# Patient Record
Sex: Male | Born: 1950
Health system: Southern US, Community
[De-identification: ages and names within clinical notes are randomized; demographics above are authoritative.]

## PROBLEM LIST (undated history)

## (undated) DIAGNOSIS — I10 Essential (primary) hypertension: Secondary | ICD-10-CM

## (undated) DIAGNOSIS — E119 Type 2 diabetes mellitus without complications: Secondary | ICD-10-CM

## (undated) DIAGNOSIS — E785 Hyperlipidemia, unspecified: Secondary | ICD-10-CM

## (undated) DIAGNOSIS — J309 Allergic rhinitis, unspecified: Secondary | ICD-10-CM

## (undated) DIAGNOSIS — I219 Acute myocardial infarction, unspecified: Secondary | ICD-10-CM

## (undated) DIAGNOSIS — M1990 Unspecified osteoarthritis, unspecified site: Secondary | ICD-10-CM

## (undated) DIAGNOSIS — I2510 Atherosclerotic heart disease of native coronary artery without angina pectoris: Secondary | ICD-10-CM

## (undated) DIAGNOSIS — K219 Gastro-esophageal reflux disease without esophagitis: Secondary | ICD-10-CM

## (undated) HISTORY — DX: Allergic rhinitis, unspecified: J30.9

## (undated) HISTORY — PX: COLONOSCOPY: SHX174

## (undated) HISTORY — PX: OTHER SURGICAL HISTORY: SHX169

## (undated) HISTORY — DX: Acute myocardial infarction, unspecified: I21.9

## (undated) HISTORY — DX: Unspecified osteoarthritis, unspecified site: M19.90

## (undated) HISTORY — DX: Essential (primary) hypertension: I10

## (undated) HISTORY — DX: Atherosclerotic heart disease of native coronary artery without angina pectoris: I25.10

## (undated) HISTORY — PX: ANGIOPLASTY: SHX39

## (undated) HISTORY — DX: Type 2 diabetes mellitus without complications: E11.9

## (undated) HISTORY — PX: UPPER GASTROINTESTINAL ENDOSCOPY: SHX188

## (undated) HISTORY — PX: EYE SURGERY: SHX253

## (undated) HISTORY — DX: Hyperlipidemia, unspecified: E78.5

## (undated) HISTORY — PX: TONSILLECTOMY: SUR1361

## (undated) HISTORY — DX: Gastro-esophageal reflux disease without esophagitis: K21.9

---

## 2013-03-22 LAB — CBC AND DIFFERENTIAL
HCT: 39 % — AB (ref 41–53)
Hemoglobin: 13.5 g/dL (ref 13.5–17.5)
Platelets: 121 10*3/uL — AB (ref 150–399)
WBC: 4.5 10^3/mL

## 2014-11-26 LAB — BASIC METABOLIC PANEL
BUN: 13 mg/dL (ref 4–21)
Creatinine: 1 mg/dL (ref <=1.3)
Glucose: 86 mg/dL
Potassium: 4.4 mmol/L (ref 3.4–5.3)
Sodium: 139 mmol/L (ref 137–147)

## 2014-11-26 LAB — LIPID PANEL
Cholesterol: 124 mg/dL (ref 0–200)
HDL: 44 mg/dL (ref 35–70)
LDL Cholesterol: 58 mg/dL
Triglycerides: 111 mg/dL (ref 40–160)

## 2014-11-26 LAB — PSA: PSA: 0.89

## 2014-11-26 LAB — TSH: TSH: 1.2 u[IU]/mL (ref <=5.90)

## 2015-01-06 ENCOUNTER — Ambulatory Visit (INDEPENDENT_AMBULATORY_CARE_PROVIDER_SITE_OTHER): Payer: Managed Care, Other (non HMO) | Admitting: Family Medicine

## 2015-01-06 ENCOUNTER — Encounter: Payer: Self-pay | Admitting: Family Medicine

## 2015-01-06 VITALS — BP 118/74 | HR 60 | Temp 98.0°F | Ht 72.0 in | Wt 216.0 lb

## 2015-01-06 DIAGNOSIS — I10 Essential (primary) hypertension: Secondary | ICD-10-CM | POA: Diagnosis not present

## 2015-01-06 DIAGNOSIS — E785 Hyperlipidemia, unspecified: Secondary | ICD-10-CM

## 2015-01-06 DIAGNOSIS — M1990 Unspecified osteoarthritis, unspecified site: Secondary | ICD-10-CM | POA: Insufficient documentation

## 2015-01-06 DIAGNOSIS — Z87891 Personal history of nicotine dependence: Secondary | ICD-10-CM | POA: Insufficient documentation

## 2015-01-06 DIAGNOSIS — K219 Gastro-esophageal reflux disease without esophagitis: Secondary | ICD-10-CM | POA: Insufficient documentation

## 2015-01-06 DIAGNOSIS — I2510 Atherosclerotic heart disease of native coronary artery without angina pectoris: Secondary | ICD-10-CM | POA: Diagnosis not present

## 2015-01-06 DIAGNOSIS — J309 Allergic rhinitis, unspecified: Secondary | ICD-10-CM | POA: Insufficient documentation

## 2015-01-06 MED ORDER — NIACIN ER (ANTIHYPERLIPIDEMIC) 750 MG PO TBCR: 750.0000 mg | EXTENDED_RELEASE_TABLET | Freq: Every day | ORAL | Status: DC

## 2015-01-06 MED ORDER — MELOXICAM 15 MG PO TABS: 15.0000 mg | ORAL_TABLET | Freq: Every day | ORAL | Status: DC

## 2015-01-06 MED ORDER — SIMVASTATIN 80 MG PO TABS: 80.0000 mg | ORAL_TABLET | Freq: Every day | ORAL | Status: DC

## 2015-01-06 MED ORDER — LOSARTAN POTASSIUM 50 MG PO TABS: 50.0000 mg | ORAL_TABLET | Freq: Every day | ORAL | Status: DC

## 2015-01-06 MED ORDER — FLUTICASONE PROPIONATE 50 MCG/ACT NA SUSP: 2.0000 | Freq: Every day | NASAL | Status: DC

## 2015-01-06 NOTE — Assessment & Plan Note (Signed)
S: compliant on simvastatin 80mg , niaspan 750mg . Has had labs within a few months A/P: obtain labs, continue current therapy

## 2015-01-06 NOTE — Assessment & Plan Note (Signed)
S:mouth fills with saliva and coughing.had been referred and is Seeing GI in [place] next Monday. Compliant with nexium 40mg  and zantac BID as prescribed at an urgent care type facility A/P: this is not classic. ? Allergic rhinitis. Sounds like will at least need endoscopy given level of therapy he is on. Asked him to have Gi forward [place] notes, sounds like needs colonoscopy as well.

## 2015-01-06 NOTE — Assessment & Plan Note (Signed)
Need labs to evaluate GFR for long term use of mobic. BMET likely every 6 months.

## 2015-01-06 NOTE — Assessment & Plan Note (Signed)
S: controlled on losartan 50mg  A/P: continue current rx

## 2015-01-06 NOTE — Progress Notes (Signed)
William Reddish, MD Phone: [phone]  Subjective:  Patient presents today to establish care. Patient was last cared for by William Park in [facility] Chief complaint-noted.   See problem oriented charting Pertinent negative ROS- no chest pain, shortness of breath, blurry vision, nausea, vomiting.  Positives-joint pain controlled by mobic mainly in hands. Seasonal allergies- watery itchy eyes and runny nose usually controlled by allegra and flonase. Otherwise see subjective  The following were reviewed and entered/updated in epic: Past Medical History  Diagnosis Date  . CAD (coronary artery disease)     Dr. Claiborne Park s/p 4 angioplasty's with last in 65. ASA. simvastatin 80mg   . Hyperlipidemia     simvastatin 80mg , niaspan 750mg   . GERD (gastroesophageal reflux disease)     nexium 40mg  and zantac BID-mout fills with saliva and coughing. Seeing GI in [facility] 12/2014  . Allergic rhinitis     allegra and flonase  . Hypertension     losartan 50mg   . Osteoarthritis     mainly hands. Mobic 15mg    Patient Active Problem List   Diagnosis Date Noted  . CAD (coronary artery disease)     Priority: High  . Hyperlipidemia     Priority: Medium  . Hypertension     Priority: Medium  . GERD (gastroesophageal reflux disease)     Priority: Medium  . Osteoarthritis     Priority: Medium  . Former smoker 01/06/2015    Priority: Low  . Allergic rhinitis     Priority: Low   Past Surgical History  Procedure Laterality Date  . Angioplasty      x4  . Pyloric stenosis      as a child    Family History  Problem Relation Age of Onset  . Dementia Father   . Kidney disease Father     CKD  . Breast cancer Mother   . Congestive Heart Failure Mother     6 time of death  . CAD Father   . AAA (abdominal aortic aneurysm) Father   . CAD Brother      Medications- reviewed and updated Current Outpatient Prescriptions  Medication Sig Dispense Refill  . aspirin 81 MG tablet Take 81 mg by mouth  daily.    Marland Kitchen esomeprazole (NEXIUM) 40 MG capsule Take 40 mg by mouth daily at 12 noon.    . fexofenadine (ALLEGRA) 180 MG tablet Take 180 mg by mouth daily.    Marland Kitchen losartan (COZAAR) 50 MG tablet Take 50 mg by mouth daily.    . meloxicam (MOBIC) 15 MG tablet Take 15 mg by mouth daily.    . niacin (NIASPAN) 750 MG CR tablet Take 750 mg by mouth at bedtime.    . ranitidine (ZANTAC) 150 MG tablet Take 150 mg by mouth 2 (two) times daily.    . simvastatin (ZOCOR) 80 MG tablet Take 80 mg by mouth daily.    . fluticasone (FLONASE) 50 MCG/ACT nasal spray Place into both nostrils daily.    . mupirocin ointment (BACTROBAN) 2 % Place 1 application into the nose as needed.    . Pseudoephedrine HCl (SUDAFED PO) Take by mouth.     Allergies-reviewed and updated No Known Allergies  History   Social History  . Marital Status: Divorced    Spouse Name: N/A  . Number of Children: N/A  . Years of Education: N/A   Social History Main Topics  . Smoking status: Former Smoker -- 2.00 packs/day for 25 years    Types: Cigarettes  Quit date: 07/30/1987  . Smokeless tobacco: Not on file  . Alcohol Use: No  . Drug Use: No  . Sexual Activity: Not on file   Other Topics Concern  . Not on file   Social History Narrative   Family: Divorced. 2 daughters. 3 granddaughters.    1 of daughters may go to prison   Job is changing   Tension with brothers      Work: truckdriver      Hobbies: take care of father who has severe dementia    ROS--See HPI , otherwise full ROS was completed and negative except as noted above  Objective: BP 118/74 mmHg  Pulse 60  Temp(Src) 98 F (36.7 C)  Ht 6' (1.829 m)  Wt 216 lb (97.977 kg)  BMI 29.29 kg/m2 Gen: NAD, resting comfortably HEENT: Mucous membranes are moist. Oropharynx normal. TM normal. Eyes: sclera and lids normal, PERRLA Neck: no thyromegaly, no cervical lymphadenopathy CV: RRR no murmurs rubs or gallops Lungs: CTAB no crackles, wheeze,  rhonchi Abdomen: soft/nontender/nondistended/normal bowel sounds. No rebound or guarding.  Ext: no edema, 2+ PT pulses Skin: warm, dry, erythematous area on left forearm suspicious for staph infection (healing per patient and recently seen ID on mupirocin)  Neuro: 5/5 strength in upper and lower extremities, normal gait, normal reflexes   Assessment/Plan:  CAD (coronary artery disease) S:Dr. Claiborne Park s/p 4 angioplasty's with last in 55. Has not seen cards in years. Compliant with ASA. simvastatin 80mg  A/P: asymptomatic. Continue medical management with statin and aspirin.  Records to see if copy of recent EKG- may need to update for new baseline.    GERD (gastroesophageal reflux disease) S:mouth fills with saliva and coughing.had been referred and is Seeing GI in [place] next Monday. Compliant with nexium 40mg  and zantac BID as prescribed at an urgent care type facility A/P: this is not classic. ? Allergic rhinitis. Sounds like will at least need endoscopy given level of therapy he is on. Asked him to have Gi forward [place] notes, sounds like needs colonoscopy as well.     Hypertension S: controlled on losartan 50mg  A/P: continue current rx   Hyperlipidemia S: compliant on simvastatin 80mg , niaspan 750mg . Has had labs within a few months A/P: obtain labs, continue current therapy    Osteoarthritis Need labs to evaluate GFR for long term use of mobic. BMET likely every 6 months.   6 month follow up. Asked patient to check in 6 months from now to see if we have received his records.

## 2015-01-06 NOTE — Patient Instructions (Addendum)
Talk with GI doctor about Colonoscopy.  Sign release of information at the front desk for records from [name] for office notes for 2 years, labs for 2 years, any immunizations, any imaging, any colonoscopy or other procedural results.   Sign release of information at the front desk for [facility] for the same as above  Check in 6 months from now unless you need me sooner  If you dont mind, call in about 3 months and make sure we got the records

## 2015-01-06 NOTE — Assessment & Plan Note (Signed)
S:Dr. [name] s/p 4 angioplasty's with last in 58. Has not seen cards in years. Compliant with ASA. simvastatin 80mg  A/P: asymptomatic. Continue medical management with statin and aspirin.  Records to see if copy of recent EKG- may need to update for new baseline.

## 2015-01-11 ENCOUNTER — Encounter: Payer: Self-pay | Admitting: Family Medicine

## 2015-01-11 LAB — T4, FREE
PSA, FREE: 1.26
T3, Free: 116.4
T4, Total: 6.4

## 2015-07-08 ENCOUNTER — Other Ambulatory Visit: Payer: Self-pay | Admitting: Family Medicine

## 2015-07-10 ENCOUNTER — Ambulatory Visit: Payer: Self-pay | Admitting: Family Medicine

## 2015-07-21 ENCOUNTER — Encounter: Payer: Self-pay | Admitting: Family Medicine

## 2015-07-21 ENCOUNTER — Ambulatory Visit (INDEPENDENT_AMBULATORY_CARE_PROVIDER_SITE_OTHER): Payer: Managed Care, Other (non HMO) | Admitting: Family Medicine

## 2015-07-21 VITALS — BP 122/64 | HR 69 | Temp 98.1°F | Wt 224.0 lb

## 2015-07-21 DIAGNOSIS — Z23 Encounter for immunization: Secondary | ICD-10-CM

## 2015-07-21 DIAGNOSIS — E785 Hyperlipidemia, unspecified: Secondary | ICD-10-CM

## 2015-07-21 DIAGNOSIS — K219 Gastro-esophageal reflux disease without esophagitis: Secondary | ICD-10-CM

## 2015-07-21 DIAGNOSIS — I10 Essential (primary) hypertension: Secondary | ICD-10-CM

## 2015-07-21 MED ORDER — LOSARTAN POTASSIUM 50 MG PO TABS: 50.0000 mg | ORAL_TABLET | Freq: Every day | ORAL | Status: DC

## 2015-07-21 MED ORDER — FLUTICASONE PROPIONATE 50 MCG/ACT NA SUSP: 2.0000 | Freq: Every day | NASAL | Status: DC

## 2015-07-21 MED ORDER — MELOXICAM 15 MG PO TABS: 15.0000 mg | ORAL_TABLET | Freq: Every day | ORAL | Status: DC

## 2015-07-21 MED ORDER — SIMVASTATIN 80 MG PO TABS: 80.0000 mg | ORAL_TABLET | Freq: Every day | ORAL | Status: DC

## 2015-07-21 MED ORDER — RANITIDINE HCL 150 MG PO TABS: 150.0000 mg | ORAL_TABLET | Freq: Two times a day (BID) | ORAL | Status: DC

## 2015-07-21 MED ORDER — ESOMEPRAZOLE MAGNESIUM 40 MG PO CPDR: 40.0000 mg | DELAYED_RELEASE_CAPSULE | Freq: Every day | ORAL | Status: DC

## 2015-07-21 NOTE — Assessment & Plan Note (Signed)
S: symptoms have drastically improved on nexium 40mg  and zantac BID-no longer coughing A/P: has seen GI and states no EGD recommended despite severe reflux. We are going to trial off zantac and see how he does- may switch over to zantac BID alone if doing well at follow up on PPI alone. Slow step down.

## 2015-07-21 NOTE — Progress Notes (Signed)
Garret Reddish, MD  Subjective:  William Park is a 64 y.o. year old very pleasant male patient who presents for/with See problem oriented charting ROS- No chest pain or shortness of breath. No headache or blurry vision.   Past Medical History-  Patient Active Problem List   Diagnosis Date Noted  . CAD (coronary artery disease)     Priority: High  . Hyperlipidemia     Priority: Medium  . Hypertension     Priority: Medium  . GERD (gastroesophageal reflux disease)     Priority: Medium  . Osteoarthritis     Priority: Medium  . Former smoker 01/06/2015    Priority: Low  . Allergic rhinitis     Priority: Low    Medications- reviewed and updated Current Outpatient Prescriptions  Medication Sig Dispense Refill  . aspirin 81 MG tablet Take 81 mg by mouth daily.    Marland Kitchen esomeprazole (NEXIUM) 40 MG capsule Take 1 capsule (40 mg total) by mouth daily at 12 noon. 90 capsule 3  . fexofenadine (ALLEGRA) 180 MG tablet Take 180 mg by mouth daily.    . fluticasone (FLONASE) 50 MCG/ACT nasal spray Place 2 sprays into both nostrils daily. 48 g 3  . losartan (COZAAR) 50 MG tablet Take 1 tablet (50 mg total) by mouth daily. 90 tablet 3  . meloxicam (MOBIC) 15 MG tablet Take 1 tablet (15 mg total) by mouth daily. 90 tablet 3  . ranitidine (ZANTAC) 150 MG tablet Take 1 tablet (150 mg total) by mouth 2 (two) times daily. 90 tablet 3  . simvastatin (ZOCOR) 80 MG tablet Take 1 tablet (80 mg total) by mouth daily. 90 tablet 3   No current facility-administered medications for this visit.    Objective: BP 122/64 mmHg  Pulse 69  Temp(Src) 98.1 F (36.7 C)  Wt 224 lb (101.606 kg) Gen: NAD, resting comfortably CV: RRR no murmurs rubs or gallops Lungs: CTAB no crackles, wheeze, rhonchi Abdomen: soft/nontender/nondistended/normal bowel sounds. No rebound or guarding.  Ext: no edema Skin: warm, dry Neuro: grossly normal, moves all extremities  Assessment/Plan:  Hypertension S: controlled.  On losartan 50mg   BP Readings from Last 3 Encounters:  07/21/15 122/64  01/06/15 118/74  A/P:Continue current meds:  6 month CPE recheck   Hyperlipidemia S: well  controlled on simvastatin 80mg , niaspan 750mg . No myalgias.  Lab Results  Component Value Date   CHOL 124 11/26/2014   HDL 44 11/26/2014   LDLCALC 58 11/26/2014   TRIG 111 11/26/2014   A/P: we will trial off niacin since no mortality data. Could consider atorvastatin if needed at 40-80mg     GERD (gastroesophageal reflux disease) S: symptoms have drastically improved on nexium 40mg  and zantac BID-no longer coughing A/P: has seen GI and states no EGD recommended despite severe reflux. We are going to trial off zantac and see how he does- may switch over to zantac BID alone if doing well at follow up on PPI alone. Slow step down.    CAD (coronary artery disease) Following with cards in [place]. Discussed mobic increases cardiac risk but he feels like he must take it for quality of life for his hands. No change.   6 month CPE Return precautions advised.   Orders Placed This Encounter  Procedures  . Flu Vaccine QUAD 36+ mos IM  . Pneumococcal conjugate vaccine 13-valent   Refilled zantac in case he needs it- he may restart Meds ordered this encounter  Medications  . meloxicam (MOBIC) 15  MG tablet    Sig: Take 1 tablet (15 mg total) by mouth daily.    Dispense:  90 tablet    Refill:  3  . simvastatin (ZOCOR) 80 MG tablet    Sig: Take 1 tablet (80 mg total) by mouth daily.    Dispense:  90 tablet    Refill:  3  . ranitidine (ZANTAC) 150 MG tablet    Sig: Take 1 tablet (150 mg total) by mouth 2 (two) times daily.    Dispense:  90 tablet    Refill:  3  . losartan (COZAAR) 50 MG tablet    Sig: Take 1 tablet (50 mg total) by mouth daily.    Dispense:  90 tablet    Refill:  3  . fluticasone (FLONASE) 50 MCG/ACT nasal spray    Sig: Place 2 sprays into both nostrils daily.    Dispense:  48 g    Refill:  3  .  esomeprazole (NEXIUM) 40 MG capsule    Sig: Take 1 capsule (40 mg total) by mouth daily at 12 noon.    Dispense:  90 capsule    Refill:  3   Health Maintenance Due  Topic Date Due  . TETANUS/TDAP -insurance doesn't cover 07/08/1969  . COLONOSCOPY - agrees to in spring 07/08/2000  . ZOSTAVAX - ask next visit 07/08/2010

## 2015-07-21 NOTE — Patient Instructions (Addendum)
Flu and [id number] received today.  No changes except for stopping niacin  You agreed to get your colonoscopy in the spring  If you get morning appointment come in fasting and we can update labs

## 2015-07-21 NOTE — Assessment & Plan Note (Signed)
Following with cards in [place]. Discussed mobic increases cardiac risk but he feels like he must take it for quality of life for his hands. No change.

## 2015-07-21 NOTE — Assessment & Plan Note (Signed)
S: well  controlled on simvastatin 80mg , niaspan 750mg . No myalgias.  Lab Results  Component Value Date   CHOL 124 11/26/2014   HDL 44 11/26/2014   LDLCALC 58 11/26/2014   TRIG 111 11/26/2014   A/P: we will trial off niacin since no mortality data. Could consider atorvastatin if needed at 40-80mg 

## 2015-07-21 NOTE — Assessment & Plan Note (Signed)
S: controlled. On losartan 50mg   BP Readings from Last 3 Encounters:  07/21/15 122/64  01/06/15 118/74  A/P:Continue current meds:  6 month CPE recheck

## 2015-09-12 ENCOUNTER — Telehealth: Payer: Self-pay | Admitting: Family Medicine

## 2015-09-12 MED ORDER — RANITIDINE HCL 150 MG PO TABS: 150.0000 mg | ORAL_TABLET | Freq: Two times a day (BID) | ORAL | Status: DC

## 2015-09-12 NOTE — Telephone Encounter (Signed)
Medication for a 90 day supply sent in.

## 2015-09-12 NOTE — Telephone Encounter (Signed)
Pt request refill of the following: ranitidine (ZANTAC) 150 MG tablet  Pt said it is cheaper for him to get a 90 day supply 180 pills instead of 90     He said he is picking up 45 pills today    Phamacy: [facility]

## 2015-12-27 ENCOUNTER — Other Ambulatory Visit: Payer: Self-pay | Admitting: Family Medicine

## 2016-01-26 ENCOUNTER — Ambulatory Visit (INDEPENDENT_AMBULATORY_CARE_PROVIDER_SITE_OTHER): Payer: Managed Care, Other (non HMO) | Admitting: Family Medicine

## 2016-01-26 ENCOUNTER — Encounter: Payer: Self-pay | Admitting: Family Medicine

## 2016-01-26 VITALS — BP 136/80 | HR 59 | Temp 98.0°F | Ht 72.0 in | Wt 225.0 lb

## 2016-01-26 DIAGNOSIS — K219 Gastro-esophageal reflux disease without esophagitis: Secondary | ICD-10-CM | POA: Diagnosis not present

## 2016-01-26 DIAGNOSIS — E785 Hyperlipidemia, unspecified: Secondary | ICD-10-CM

## 2016-01-26 DIAGNOSIS — I10 Essential (primary) hypertension: Secondary | ICD-10-CM

## 2016-01-26 DIAGNOSIS — I2510 Atherosclerotic heart disease of native coronary artery without angina pectoris: Secondary | ICD-10-CM

## 2016-01-26 DIAGNOSIS — Z23 Encounter for immunization: Secondary | ICD-10-CM

## 2016-01-26 DIAGNOSIS — Z1211 Encounter for screening for malignant neoplasm of colon: Secondary | ICD-10-CM

## 2016-01-26 MED ORDER — TETANUS-DIPHTH-ACELL PERTUSSIS 5-2.5-18.5 LF-MCG/0.5 IM SUSP
0.5000 mL | Freq: Once | INTRAMUSCULAR | Status: AC
  Administered 2016-01-26: 0.5 mL via INTRAMUSCULAR

## 2016-01-26 NOTE — Patient Instructions (Addendum)
No changes in medicine  We will call you within a week about your referral to GI Dr. Laural Golden. If you do not hear within 2 weeks, give [place] a call.   No changes in medicine right now  Tdap before you leave

## 2016-01-26 NOTE — Assessment & Plan Note (Signed)
S: last visit was on nexium 40mg  and zantac BID- coughing resolved on this. We discussed stepping down to nexium alone at last visit. A/P: Cough increased without zantac and restarted. Would like GI consult with Dr. Laural Golden and referral placed- also needs colonoscopy

## 2016-01-26 NOTE — Assessment & Plan Note (Signed)
S: controlled on losartan 50mg  BP Readings from Last 3 Encounters:  01/26/16 136/80  07/21/15 122/64  01/06/15 118/74  A/P:Continue current meds:  Well controlled

## 2016-01-26 NOTE — Assessment & Plan Note (Signed)
S: well controlled on simvastatin 80mg . stoppe niaspan 750mg  last visit with no mortality benefit . No myalgias.  Lab Results  Component Value Date   CHOL 124 11/26/2014   HDL 44 11/26/2014   LDLCALC 58 11/26/2014   TRIG 111 11/26/2014   A/P: discussed updating lipids , will plan at cpe

## 2016-01-26 NOTE — Assessment & Plan Note (Addendum)
S: Dr. [name] s/p 4 angioplasty's with last in 77.  Now cared for in [place]. Compliant with ASA. simvastatin 80mg . 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 Asymptomatic. See note for EKG report. Aware of cardiac risks on meloxicam but with hand OA states just cant tolerate it without medicine

## 2016-01-26 NOTE — Progress Notes (Signed)
Subjective:  William Park is a 65 y.o. year old very pleasant male patient who presents for/with See problem oriented charting ROS- No chest pain or shortness of breath. No headache or blurry vision. Does have cough if off zantac or nexium.see any ROS included in HPI as well.   Past Medical History-  Patient Active Problem List   Diagnosis Date Noted  . CAD (coronary artery disease)     Priority: High  . Hyperlipidemia     Priority: Medium  . Hypertension     Priority: Medium  . GERD (gastroesophageal reflux disease)     Priority: Medium  . Osteoarthritis     Priority: Medium  . Former smoker 01/06/2015    Priority: Low  . Allergic rhinitis     Priority: Low    Medications- reviewed and updated Current Outpatient Prescriptions  Medication Sig Dispense Refill  . aspirin 81 MG tablet Take 81 mg by mouth daily.    Marland Kitchen esomeprazole (NEXIUM) 40 MG capsule Take 1 capsule (40 mg total) by mouth daily at 12 noon. 90 capsule 3  . fexofenadine (ALLEGRA) 180 MG tablet Take 180 mg by mouth daily.    . fluticasone (FLONASE) 50 MCG/ACT nasal spray Place 2 sprays into both nostrils daily. 48 g 3  . losartan (COZAAR) 50 MG tablet TAKE 1 TABLET (50 MG TOTAL) BY MOUTH DAILY. 90 tablet 3  . meloxicam (MOBIC) 15 MG tablet Take 1 tablet (15 mg total) by mouth daily. 90 tablet 3  . ranitidine (ZANTAC) 150 MG tablet Take 1 tablet (150 mg total) by mouth 2 (two) times daily. 180 tablet 3  . simvastatin (ZOCOR) 80 MG tablet TAKE 1 TABLET (80 MG TOTAL) BY MOUTH DAILY. 90 tablet 3   No current facility-administered medications for this visit.    Objective: BP 136/80 mmHg  Pulse 59  Temp(Src) 98 F (36.7 C) (Oral)  Ht 6' (1.829 m)  Wt 225 lb (102.059 kg)  BMI 30.51 kg/m2  SpO2 95% Gen: NAD, resting comfortably CV: RRR no murmurs rubs or gallops Lungs: CTAB no crackles, wheeze, rhonchi Abdomen: soft/nontender/nondistended/normal bowel sounds. No rebound or guarding.  Ext: trace  edema Skin: warm, dry Neuro: grossly normal, moves all extremities  EKG - sinus rhythm rate slightly bradycardic at 54, normal axis, normal intervals, no hypertrophy, nonspecific RSR in v1.   Reassuring EKG  Assessment/Plan:  Hypertension S: controlled on losartan 50mg  BP Readings from Last 3 Encounters:  01/26/16 136/80  07/21/15 122/64  01/06/15 118/74  A/P:Continue current meds:  Well controlled  Hyperlipidemia S: well controlled on simvastatin 80mg . stoppe niaspan 750mg  last visit with no mortality benefit . No myalgias.  Lab Results  Component Value Date   CHOL 124 11/26/2014   HDL 44 11/26/2014   LDLCALC 58 11/26/2014   TRIG 111 11/26/2014   A/P: discussed updating lipids , will plan at cpe  GERD (gastroesophageal reflux disease) S: last visit was on nexium 40mg  and zantac BID- coughing resolved on this. We discussed stepping down to nexium alone at last visit. A/P: Cough increased without zantac and restarted. Would like GI consult with Dr. Laural Golden and referral placed- also needs colonoscopy  CAD (coronary artery disease) S: Dr. [name] s/p 4 angioplasty's with last in 45.  Now cared for in [place]. Compliant with ASA. simvastatin 80mg . 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 Asymptomatic. See note for EKG report. Aware of cardiac risks on meloxicam  but with hand OA states just cant tolerate it without medicine   Return in about 7 months (around 08/27/2016) for 5-7 months with labs a few weeks before.  Orders Placed This Encounter  Procedures  . Ambulatory referral to Gastroenterology    Referral Priority:  Routine    Referral Type:  Consultation    Referral Reason:  Specialty Services Required    Referred to Provider:  Rogene Houston, MD    Number of Visits Requested:  1  . EKG 12-Lead    Standing Status: Standing     Number of Occurrences: 1     Standing Expiration Date:     Order Specific  Question:  Reason for Exam    Answer:  Coronary Artery Disease    Tdap given by Lucianne Lei, LPN  Return precautions advised.  Garret Reddish, MD

## 2016-01-26 NOTE — Progress Notes (Signed)
Pre visit review using our clinic review tool, if applicable. No additional management support is needed unless otherwise documented below in the visit note. 

## 2016-01-29 ENCOUNTER — Encounter (INDEPENDENT_AMBULATORY_CARE_PROVIDER_SITE_OTHER): Payer: Self-pay | Admitting: Internal Medicine

## 2016-02-19 ENCOUNTER — Encounter (INDEPENDENT_AMBULATORY_CARE_PROVIDER_SITE_OTHER): Payer: Self-pay | Admitting: Internal Medicine

## 2016-02-19 ENCOUNTER — Other Ambulatory Visit (INDEPENDENT_AMBULATORY_CARE_PROVIDER_SITE_OTHER): Payer: Self-pay | Admitting: Internal Medicine

## 2016-02-19 ENCOUNTER — Encounter (INDEPENDENT_AMBULATORY_CARE_PROVIDER_SITE_OTHER): Payer: Self-pay | Admitting: *Deleted

## 2016-02-19 ENCOUNTER — Ambulatory Visit (INDEPENDENT_AMBULATORY_CARE_PROVIDER_SITE_OTHER): Payer: Managed Care, Other (non HMO) | Admitting: Internal Medicine

## 2016-02-19 VITALS — BP 106/60 | HR 68 | Temp 98.0°F | Ht 72.0 in | Wt 219.5 lb

## 2016-02-19 DIAGNOSIS — K219 Gastro-esophageal reflux disease without esophagitis: Secondary | ICD-10-CM | POA: Diagnosis not present

## 2016-02-19 NOTE — Progress Notes (Signed)
Subjective:    Patient ID: William Park, male    DOB: 06-07-1951, 65 y.o.   MRN: [id number]  HPI Referred by Dr. Garret Reddish for GERD. He presents today with c/o that he has had heart burn for years. He started Nexium 2 yrs ago.  Started Nexium due to burning and choking feeling during the night. The Nexium took care of those symptoms. Today, he tells me he coughs and his throat feels up with saliva. If he misses a dose of the Zantac and Allegra, and Sudafed, saliva comes up in his mouth. No dysphagia.  He says he has acid reflux. Symptoms x 2 years which have progressively worsened. Last colonoscopy 8-9 years ago by Dr. Posey Pronto and he says he had a polyp. Will try to find records. Hx of angioplasty and maintained on ASA 81mg .     Review of Systems Past Medical History:  Diagnosis Date  . Allergic rhinitis    allegra and flonase  . CAD (coronary artery disease)    Dr. [name] s/p 4 angioplasty's with last in 34. ASA. simvastatin 80mg   . GERD (gastroesophageal reflux disease)    nexium 40mg  and zantac BID-mout fills with saliva and coughing. Seeing GI in [facility] 12/2014  . Hyperlipidemia    simvastatin 80mg , niaspan 750mg   . Hypertension    losartan 50mg   . Osteoarthritis    mainly hands. Mobic 15mg     Past Surgical History:  Procedure Laterality Date  . ANGIOPLASTY     x4  . pyloric stenosis     as a child    No Known Allergies  Current Outpatient Prescriptions on File Prior to Visit  Medication Sig Dispense Refill  . aspirin 81 MG tablet Take 81 mg by mouth daily.    Marland Kitchen esomeprazole (NEXIUM) 40 MG capsule Take 1 capsule (40 mg total) by mouth daily at 12 noon. 90 capsule 3  . fexofenadine (ALLEGRA) 180 MG tablet Take 180 mg by mouth daily.    . fluticasone (FLONASE) 50 MCG/ACT nasal spray Place 2 sprays into both nostrils daily. (Patient taking differently: Place 2 sprays into both nostrils as needed. ) 48 g 3  . losartan (COZAAR) 50 MG tablet TAKE 1 TABLET  (50 MG TOTAL) BY MOUTH DAILY. 90 tablet 3  . meloxicam (MOBIC) 15 MG tablet Take 1 tablet (15 mg total) by mouth daily. 90 tablet 3  . ranitidine (ZANTAC) 150 MG tablet Take 1 tablet (150 mg total) by mouth 2 (two) times daily. 180 tablet 3  . simvastatin (ZOCOR) 80 MG tablet TAKE 1 TABLET (80 MG TOTAL) BY MOUTH DAILY. 90 tablet 3   No current facility-administered medications on file prior to visit.        Objective:   Physical Exam Blood pressure 106/60, pulse 68, temperature 98 F (36.7 C), height 6' (1.829 m), weight 219 lb 8 oz (99.6 kg). Alert and oriented. Skin warm and dry. Oral mucosa is moist.   . Sclera anicteric, conjunctivae is pink. Thyroid not enlarged. No cervical lymphadenopathy. Lungs clear. Heart regular rate and rhythm.  Abdomen is soft. Bowel sounds are positive. No hepatomegaly. No abdominal masses felt. No tenderness.  No edema to lower extremities.          Assessment & Plan:  GERD really not controlled at this time. PUD needs to be ruled out. Continue the Nexium and Zantac.  EGD. The risks and benefits such as perforation, bleeding, and infection were reviewed with the patient and is  agreeable.

## 2016-02-19 NOTE — Patient Instructions (Signed)
The risks and benefits such as perforation, bleeding, and infection were reviewed with the patient and is agreeable. 

## 2016-03-25 ENCOUNTER — Telehealth (INDEPENDENT_AMBULATORY_CARE_PROVIDER_SITE_OTHER): Payer: Self-pay | Admitting: *Deleted

## 2016-03-25 NOTE — Telephone Encounter (Signed)
Patient called and he has lost his job and needs to cancel EGD

## 2016-03-28 ENCOUNTER — Ambulatory Visit (HOSPITAL_COMMUNITY)
Admission: RE | Admit: 2016-03-28 | Payer: Managed Care, Other (non HMO) | Source: Ambulatory Visit | Admitting: Internal Medicine

## 2016-03-28 ENCOUNTER — Encounter (HOSPITAL_COMMUNITY): Admission: RE | Payer: Self-pay | Source: Ambulatory Visit

## 2016-03-28 SURGERY — EGD (ESOPHAGOGASTRODUODENOSCOPY)
Anesthesia: Moderate Sedation

## 2016-05-03 ENCOUNTER — Encounter: Payer: Self-pay | Admitting: Family Medicine

## 2016-05-03 ENCOUNTER — Telehealth: Payer: Self-pay | Admitting: Family Medicine

## 2016-05-03 DIAGNOSIS — F329 Major depressive disorder, single episode, unspecified: Secondary | ICD-10-CM | POA: Insufficient documentation

## 2016-05-03 MED ORDER — SERTRALINE HCL 50 MG PO TABS: 50.0000 mg | ORAL_TABLET | Freq: Every day | ORAL | 3 refills | Status: DC

## 2016-05-03 NOTE — Telephone Encounter (Signed)
S: Patient presents today with father for his visit but pulls me aside and discusses has been dealing with depression since learning about job loss in recent months. Stress of caring for father also making things difficult. PHQ9 of 14 today.  A/P: Start zoloft 50mg - needs follow up in 2-3 weeks. We are trying to accommodate patient without office visit today to allow time for him to get insurance card. Does have some thoughts of being better off dead- but he is able to contract for safety.     Taking the medicine as directed and not missing any doses is one of the best things you can do to treat your depression.  Here are some things to keep in mind:  1) Side effects (stomach upset, some increased anxiety) may happen before you notice a benefit.  These side effects typically go away over time. 2) Changes to your dose of medicine or a change in medication all together is sometimes necessary 3) Most people need to be on medication at least 6-12 months 4) Many people will notice an improvement within two weeks but the full effect of the medication can take up to 4-6 weeks 5) Stopping the medication when you start feeling better often results in a return of symptoms 6) If you start having thoughts of hurting yourself or others after starting this medicine, call our office immediately at [phone] or seek care through 911.

## 2016-05-23 ENCOUNTER — Ambulatory Visit (INDEPENDENT_AMBULATORY_CARE_PROVIDER_SITE_OTHER): Payer: Medicare Other | Admitting: Family Medicine

## 2016-05-23 ENCOUNTER — Encounter: Payer: Self-pay | Admitting: Family Medicine

## 2016-05-23 VITALS — BP 140/90 | HR 61 | Temp 98.2°F | Wt 219.0 lb

## 2016-05-23 DIAGNOSIS — I10 Essential (primary) hypertension: Secondary | ICD-10-CM

## 2016-05-23 DIAGNOSIS — I2510 Atherosclerotic heart disease of native coronary artery without angina pectoris: Secondary | ICD-10-CM

## 2016-05-23 DIAGNOSIS — F324 Major depressive disorder, single episode, in partial remission: Secondary | ICD-10-CM | POA: Diagnosis not present

## 2016-05-23 MED ORDER — LOSARTAN POTASSIUM-HCTZ 100-25 MG PO TABS: 1.0000 | ORAL_TABLET | Freq: Every day | ORAL | 5 refills | Status: DC

## 2016-05-23 NOTE — Patient Instructions (Signed)
Start combo pill. Keep checking blood pressure at home- follow up 2-4 weeks.   Glad depression doing better without meds- let me know if symptoms get worse  Try to lay off mobic as much as possible

## 2016-05-23 NOTE — Assessment & Plan Note (Addendum)
S: controlled in past but then had some DOT physicals and increased losartan from 50mg  to  100mg . Since then has checked at home- Some days has been as high as 176 other days in 150s. Poor control at present BP Readings from Last 3 Encounters:  05/23/16 140/90  02/19/16 106/60  01/26/16 136/80  A/P:Continue current meds:  But add hctz 25mg  as part of combo pill

## 2016-05-23 NOTE — Progress Notes (Signed)
Pre visit review using our clinic review tool, if applicable. No additional management support is needed unless otherwise documented below in the visit note. 

## 2016-05-23 NOTE — Assessment & Plan Note (Signed)
S: during his dad's visit he complained of depressed mood given caring for father and recent loss of job. Had been going on since before job loss previously reported. With phq9 of 14, plan was zoloft 50mg  with 2-3 week follow up. He ended up getting a job and realized that he had poor concentration with work previously on [place]. He opted not to start medicine A/P: PH!9 of 3 now but still with some anhedonia and depressed mood- if symptoms worsen he agrees to reach out to me and consider starting the zoloft

## 2016-05-23 NOTE — Progress Notes (Signed)
Subjective:  William Park is a 65 y.o. year old very pleasant male patient who presents for/with See problem oriented charting ROS- No chest pain or shortness of breath. No headache or blurry vision. No SI.see any ROS included in HPI as well.   Past Medical History-  Patient Active Problem List   Diagnosis Date Noted  . CAD (coronary artery disease)     Priority: High  . Hyperlipidemia     Priority: Medium  . Hypertension     Priority: Medium  . GERD (gastroesophageal reflux disease)     Priority: Medium  . Osteoarthritis     Priority: Medium  . Former smoker 01/06/2015    Priority: Low  . Allergic rhinitis     Priority: Low  . Major depression 05/03/2016  . Gastroesophageal reflux disease without esophagitis 02/19/2016    Medications- reviewed and updated Current Outpatient Prescriptions  Medication Sig Dispense Refill  . aspirin 81 MG tablet Take 81 mg by mouth daily.    Marland Kitchen esomeprazole (NEXIUM) 40 MG capsule Take 1 capsule (40 mg total) by mouth daily at 12 noon. 90 capsule 3  . fexofenadine (ALLEGRA) 180 MG tablet Take 180 mg by mouth daily.    . fluticasone (FLONASE) 50 MCG/ACT nasal spray Place 2 sprays into both nostrils daily. (Patient taking differently: Place 2 sprays into both nostrils as needed for allergies. ) 48 g 3  . meloxicam (MOBIC) 15 MG tablet Take 1 tablet (15 mg total) by mouth daily. 90 tablet 3  . pseudoephedrine (SUDAFED) 30 MG tablet Take 30 mg by mouth every 4 (four) hours as needed for congestion.    . ranitidine (ZANTAC) 150 MG tablet Take 1 tablet (150 mg total) by mouth 2 (two) times daily. 180 tablet 3  . simvastatin (ZOCOR) 80 MG tablet TAKE 1 TABLET (80 MG TOTAL) BY MOUTH DAILY. 90 tablet 3  . losartan-hydrochlorothiazide (HYZAAR) 100-25 MG tablet Take 1 tablet by mouth daily. 31 tablet 5  . sertraline (ZOLOFT) 50 MG tablet Take 1 tablet (50 mg total) by mouth daily. (Patient not taking: Reported on 05/23/2016) 30 tablet 3   No current  facility-administered medications for this visit.     Objective: BP 140/90 (BP Location: Left Arm, Patient Position: Sitting, Cuff Size: Large)   Pulse 61   Temp 98.2 F (36.8 C) (Oral)   Wt 219 lb (99.3 kg)   SpO2 95%   BMI 29.70 kg/m  Gen: NAD, resting comfortably CV: RRR no murmurs rubs or gallops Lungs: CTAB no crackles, wheeze, rhonchi obese Ext: no edema  Assessment/Plan:  Hypertension S: controlled in past but then had some DOT physicals and increased losartan from 50mg  to  100mg . Since then has checked at home- Some days has been as high as 176 other days in 150s. Poor control at present BP Readings from Last 3 Encounters:  05/23/16 140/90  02/19/16 106/60  01/26/16 136/80  A/P:Continue current meds:  But add hctz 25mg  as part of combo pill  Major depression S: during his dad's visit he complained of depressed mood given caring for father and recent loss of job. Had been going on since before job loss previously reported. With phq9 of 14, plan was zoloft 50mg  with 2-3 week follow up. He ended up getting a job and realized that he had poor concentration with work previously on [place]. He opted not to start medicine A/P: PH!9 of 3 now but still with some anhedonia and depressed mood- if symptoms worsen  he agrees to reach out to me and consider starting the zoloft  2-4 week follow up for BP recheck  Meds ordered this encounter  Medications  . losartan-hydrochlorothiazide (HYZAAR) 100-25 MG tablet    Sig: Take 1 tablet by mouth daily.    Dispense:  31 tablet    Refill:  5    Return precautions advised.  Garret Reddish, MD

## 2016-06-01 DIAGNOSIS — Z23 Encounter for immunization: Secondary | ICD-10-CM | POA: Diagnosis not present

## 2016-06-10 ENCOUNTER — Other Ambulatory Visit: Payer: Self-pay | Admitting: Family Medicine

## 2016-07-23 DIAGNOSIS — M9901 Segmental and somatic dysfunction of cervical region: Secondary | ICD-10-CM | POA: Diagnosis not present

## 2016-07-23 DIAGNOSIS — S134XXA Sprain of ligaments of cervical spine, initial encounter: Secondary | ICD-10-CM | POA: Diagnosis not present

## 2016-07-23 DIAGNOSIS — S233XXA Sprain of ligaments of thoracic spine, initial encounter: Secondary | ICD-10-CM | POA: Diagnosis not present

## 2016-07-23 DIAGNOSIS — M9902 Segmental and somatic dysfunction of thoracic region: Secondary | ICD-10-CM | POA: Diagnosis not present

## 2016-07-23 DIAGNOSIS — S335XXA Sprain of ligaments of lumbar spine, initial encounter: Secondary | ICD-10-CM | POA: Diagnosis not present

## 2016-07-23 DIAGNOSIS — M9903 Segmental and somatic dysfunction of lumbar region: Secondary | ICD-10-CM | POA: Diagnosis not present

## 2016-07-30 DIAGNOSIS — S134XXA Sprain of ligaments of cervical spine, initial encounter: Secondary | ICD-10-CM | POA: Diagnosis not present

## 2016-07-30 DIAGNOSIS — S335XXA Sprain of ligaments of lumbar spine, initial encounter: Secondary | ICD-10-CM | POA: Diagnosis not present

## 2016-07-30 DIAGNOSIS — M9902 Segmental and somatic dysfunction of thoracic region: Secondary | ICD-10-CM | POA: Diagnosis not present

## 2016-07-30 DIAGNOSIS — S233XXA Sprain of ligaments of thoracic spine, initial encounter: Secondary | ICD-10-CM | POA: Diagnosis not present

## 2016-07-30 DIAGNOSIS — M9901 Segmental and somatic dysfunction of cervical region: Secondary | ICD-10-CM | POA: Diagnosis not present

## 2016-07-30 DIAGNOSIS — M9903 Segmental and somatic dysfunction of lumbar region: Secondary | ICD-10-CM | POA: Diagnosis not present

## 2016-07-31 ENCOUNTER — Other Ambulatory Visit (INDEPENDENT_AMBULATORY_CARE_PROVIDER_SITE_OTHER): Payer: Medicare Other

## 2016-07-31 DIAGNOSIS — E785 Hyperlipidemia, unspecified: Secondary | ICD-10-CM | POA: Diagnosis not present

## 2016-07-31 LAB — HEPATIC FUNCTION PANEL
ALT: 13 U/L (ref 0–53)
AST: 17 U/L (ref 0–37)
Albumin: 4 g/dL (ref 3.5–5.2)
Alkaline Phosphatase: 56 U/L (ref 39–117)
Bilirubin, Direct: 0.1 mg/dL (ref 0.0–0.3)
Total Bilirubin: 0.6 mg/dL (ref 0.2–1.2)
Total Protein: 6 g/dL (ref 6.0–8.3)

## 2016-07-31 LAB — CBC WITH DIFFERENTIAL/PLATELET
Basophils Absolute: 0 10*3/uL (ref 0.0–0.1)
Basophils Relative: 0.4 % (ref 0.0–3.0)
Eosinophils Absolute: 0.1 10*3/uL (ref 0.0–0.7)
Eosinophils Relative: 2.1 % (ref 0.0–5.0)
HCT: 35.8 % — ABNORMAL LOW (ref 39.0–52.0)
Hemoglobin: 12.3 g/dL — ABNORMAL LOW (ref 13.0–17.0)
Lymphocytes Relative: 40.7 % (ref 12.0–46.0)
Lymphs Abs: 2.2 10*3/uL (ref 0.7–4.0)
MCHC: 34.4 g/dL (ref 30.0–36.0)
MCV: 94.2 fl (ref 78.0–100.0)
Monocytes Absolute: 0.4 10*3/uL (ref 0.1–1.0)
Monocytes Relative: 7.9 % (ref 3.0–12.0)
Neutro Abs: 2.7 10*3/uL (ref 1.4–7.7)
Neutrophils Relative %: 48.9 % (ref 43.0–77.0)
Platelets: 136 10*3/uL — ABNORMAL LOW (ref 150.0–400.0)
RBC: 3.8 Mil/uL — ABNORMAL LOW (ref 4.22–5.81)
RDW: 14.3 % (ref 11.5–15.5)
WBC: 5.4 10*3/uL (ref 4.0–10.5)

## 2016-07-31 LAB — BASIC METABOLIC PANEL
BUN: 18 mg/dL (ref 6–23)
CO2: 31 mEq/L (ref 19–32)
Calcium: 9 mg/dL (ref 8.4–10.5)
Chloride: 105 mEq/L (ref 96–112)
Creatinine, Ser: 1.17 mg/dL (ref 0.40–1.50)
GFR: 66.28 mL/min (ref >60.00)
Glucose, Bld: 105 mg/dL — ABNORMAL HIGH (ref 70–99)
Potassium: 4.6 mEq/L (ref 3.5–5.1)
Sodium: 141 mEq/L (ref 135–145)

## 2016-07-31 LAB — LIPID PANEL
Cholesterol: 120 mg/dL (ref 0–200)
HDL: 39.4 mg/dL (ref >39.00)
LDL Cholesterol: 63 mg/dL (ref 0–99)
NonHDL: 80.32
Total CHOL/HDL Ratio: 3
Triglycerides: 89 mg/dL (ref 0.0–149.0)
VLDL: 17.8 mg/dL (ref 0.0–40.0)

## 2016-08-11 ENCOUNTER — Other Ambulatory Visit: Payer: Self-pay | Admitting: Family Medicine

## 2016-08-13 ENCOUNTER — Encounter: Payer: Self-pay | Admitting: Family Medicine

## 2016-08-13 ENCOUNTER — Ambulatory Visit (INDEPENDENT_AMBULATORY_CARE_PROVIDER_SITE_OTHER): Payer: Medicare Other | Admitting: Family Medicine

## 2016-08-13 VITALS — BP 118/56 | HR 72 | Temp 98.1°F | Ht 71.0 in | Wt 215.2 lb

## 2016-08-13 DIAGNOSIS — I10 Essential (primary) hypertension: Secondary | ICD-10-CM | POA: Diagnosis not present

## 2016-08-13 DIAGNOSIS — F325 Major depressive disorder, single episode, in full remission: Secondary | ICD-10-CM

## 2016-08-13 DIAGNOSIS — I2510 Atherosclerotic heart disease of native coronary artery without angina pectoris: Secondary | ICD-10-CM | POA: Diagnosis not present

## 2016-08-13 DIAGNOSIS — M19041 Primary osteoarthritis, right hand: Secondary | ICD-10-CM

## 2016-08-13 DIAGNOSIS — Z0001 Encounter for general adult medical examination with abnormal findings: Secondary | ICD-10-CM | POA: Diagnosis not present

## 2016-08-13 DIAGNOSIS — D649 Anemia, unspecified: Secondary | ICD-10-CM | POA: Diagnosis not present

## 2016-08-13 DIAGNOSIS — E785 Hyperlipidemia, unspecified: Secondary | ICD-10-CM

## 2016-08-13 DIAGNOSIS — M19042 Primary osteoarthritis, left hand: Secondary | ICD-10-CM

## 2016-08-13 MED ORDER — DICLOFENAC SODIUM 1 % TD GEL: 2.0000 g | Freq: Four times a day (QID) | TRANSDERMAL | 5 refills | Status: DC

## 2016-08-13 NOTE — Progress Notes (Signed)
Phone: [phone]  Subjective:  Patient presents today for their annual wellness visit.    Preventive Screening-Counseling & Management  Smoking Status: former Smoker, quit over 20 years ago, AAA screen negative last yera Second Hand Smoking status: No smokers in home  Risk Factors Regular exercise: very active with work, advised regular exercise outside work Diet: lost 4 lbs since last visit. Largely stable. Discussed losing a few more lbs would be helpful.  Goal under 200 Wt Readings from Last 3 Encounters:  08/13/16 215 lb 3.2 oz (97.6 kg)  05/23/16 219 lb (99.3 kg)  02/19/16 219 lb 8 oz (99.6 kg)  Fall Risk: None  Fall Risk  05/23/2016  Falls in the past year? No    Cardiac risk factors:  advanced age (older than 24 for men, 64 for women)  Known CAD Hyperlipidemia but conroleld No diabetes.  HTN but controlled   Depression Screen None. PHQ2 0  Depression screen [facility] 2/9 05/23/2016 07/21/2015  Decreased Interest 0 0  Down, Depressed, Hopeless 0 0  PHQ - 2 Score 0 0    Activities of Daily Living Independent ADLs and IADLs   Hearing Difficulties: -patient declines  Cognitive Testing No reported trouble.   Normal 3 word recall  List the Names of Other Physician/Practitioners you currently use: -cardiologist in [place]- [name]. Advised to follow up this year -GI [place]- needs colonoscopy  Immunization History  Administered Date(s) Administered  . Influenza,inj,Quad PF,36+ Mos 07/21/2015  . Influenza-Unspecified 07/17/2016  . Pneumococcal Conjugate-13 07/21/2015  . Tdap 01/26/2016   Required Immunizations needed today shingles- opts to wait on shingrix  Screening tests- up to date Health Maintenance Due  Topic Date Due  . COLONOSCOPY - needs but no one to sit with him 07/08/2000  . ZOSTAVAX - wait until shingrix available 07/08/2010  . PNA vac Low Risk Adult (2 of 2 - PPSV23)- get records 07/20/2016   ROS- No pertinent positives  discovered in course of AWV ROS pertinent- No chest pain or shortness of breath. No headache or blurry vision. Some mild fatigue  The following were reviewed and entered/updated in epic: Past Medical History:  Diagnosis Date  . Allergic rhinitis    allegra and flonase  . CAD (coronary artery disease)    Dr. [name] s/p 4 angioplasty's with last in 40. ASA. simvastatin 80mg   . GERD (gastroesophageal reflux disease)    nexium 40mg  and zantac BID-mout fills with saliva and coughing. Seeing GI in [place] 12/2014  . Hyperlipidemia    simvastatin 80mg , niaspan 750mg   . Hypertension    losartan 50mg   . Osteoarthritis    mainly hands. Mobic 15mg    Patient Active Problem List   Diagnosis Date Noted  . CAD (coronary artery disease)     Priority: High  . Gastroesophageal reflux disease without esophagitis 02/19/2016    Priority: Medium  . Hyperlipidemia     Priority: Medium  . Hypertension     Priority: Medium  . GERD (gastroesophageal reflux disease)     Priority: Medium  . Osteoarthritis     Priority: Medium  . Former smoker 01/06/2015    Priority: Low  . Allergic rhinitis     Priority: Low  . Anemia 08/13/2016   Past Surgical History:  Procedure Laterality Date  . ANGIOPLASTY     x4  . pyloric stenosis     as a child    Family History  Problem Relation Age of Onset  . Dementia Father   . Kidney disease  Father     CKD  . CAD Father   . AAA (abdominal aortic aneurysm) Father   . Breast cancer Mother   . Congestive Heart Failure Mother     9 time of death  . CAD Brother     Medications- reviewed and updated Current Outpatient Prescriptions  Medication Sig Dispense Refill  . aspirin 81 MG tablet Take 81 mg by mouth daily.    Marland Kitchen esomeprazole (NEXIUM) 40 MG capsule TAKE (1) CAPSULE BY MOUTH ONCE DAILY AT 12 NOON. 30 capsule 5  . fluticasone (FLONASE) 50 MCG/ACT nasal spray Place 2 sprays into both nostrils daily. (Patient taking differently: Place 2 sprays into both  nostrils as needed for allergies. ) 48 g 3  . losartan-hydrochlorothiazide (HYZAAR) 100-25 MG tablet Take 1 tablet by mouth daily. 31 tablet 5  . meloxicam (MOBIC) 15 MG tablet TAKE 1 TABLET BY MOUTH ONCE DAILY. 90 tablet 0  . pseudoephedrine (SUDAFED) 30 MG tablet Take 30 mg by mouth every 4 (four) hours as needed for congestion.    . ranitidine (ZANTAC) 150 MG tablet Take 1 tablet (150 mg total) by mouth 2 (two) times daily. 180 tablet 3  . simvastatin (ZOCOR) 80 MG tablet TAKE 1 TABLET (80 MG TOTAL) BY MOUTH DAILY. 90 tablet 3  . diclofenac sodium (VOLTAREN) 1 % GEL Apply 2 g topically 4 (four) times daily. 100 g 5   No current facility-administered medications for this visit.     Allergies-reviewed and updated No Known Allergies  Social History   Social History  . Marital status: Divorced    Spouse name: N/A  . Number of children: N/A  . Years of education: N/A   Social History Main Topics  . Smoking status: Former Smoker    Packs/day: 2.00    Years: 25.00    Types: Cigarettes    Quit date: 07/30/1987  . Smokeless tobacco: None  . Alcohol use No  . Drug use: No  . Sexual activity: Not Asked   Other Topics Concern  . None   Social History Narrative   Family: Divorced. 2 daughters. 3 granddaughters.    1 of daughters may go to prison   Job is changing   Tension with brothers      Work: truckdriver      Hobbies: take care of father who has severe dementia    Objective: BP (!) 118/56 (BP Location: Left Arm, Patient Position: Sitting, Cuff Size: Large)   Pulse 72   Temp 98.1 F (36.7 C) (Oral)   Ht 5\' 11"  (1.803 m)   Wt 215 lb 3.2 oz (97.6 kg)   SpO2 97%   BMI 30.01 kg/m  Gen: NAD, resting comfortably HEENT: Mucous membranes are moist. Oropharynx normal Neck: no thyromegaly CV: RRR no murmurs rubs or gallops Lungs: CTAB no crackles, wheeze, rhonchi Abdomen: soft/nontender/nondistended/normal bowel sounds. No rebound or guarding.  Ext: no edema Skin: warm,  dry Neuro: grossly normal, moves all extremities, PERRLA  Assessment/Plan:  AWV completed- discussed recommended screenings anddocumented any personalized health advice and referrals for preventive counseling. See AVS as well which was given to patient.   66 y.o. male presenting for AWV.  Health Maintenance counseling: 1. Anticipatory guidance: Patient counseled regarding regular dental exams, eye exams, wearing seatbelts.  2. Risk factor reduction:  Advised patient of need for regular exercise and diet rich and fruits and vegetables to reduce risk of heart attack and stroke. Discussed need for weight loss 3. Immunizations/screenings/ancillary studies Immunization  History  Administered Date(s) Administered  . Influenza,inj,Quad PF,36+ Mos 07/21/2015  . Influenza-Unspecified 07/17/2016  . Pneumococcal Conjugate-13 07/21/2015  . Tdap 01/26/2016   Health Maintenance Due  Topic Date Due  . COLONOSCOPY - declines for now 07/08/2000  . ZOSTAVAX - wait on shingrix 07/08/2010  . PNA vac Low Risk Adult (2 of 2 - PPSV23)- today 07/20/2016   4. Prostate cancer screening- opts out for now, may consider in future. Aware of benefits/risks of creening  Lab Results  Component Value Date   PSA 0.89 11/26/2014   5. Colon cancer screening - late 50s, needs repeat but has no one to sit with him until may. Agrees to repeat CBC in a month or so then colonoscopy in may. See anemia below  Status of chronic or acute concerns   CAD (coronary artery disease) S:compliant with aspirin, statin, BP control. Asymptomatic other than some fatigue which could be due to anemia. Has not seen cardiology in 2 years.  A/P: I advised patient to schedule cardiology follow up- seems somewhat resistant with financial constraints. He will consider follow up but continue current medications   Osteoarthritis S: primarily in his hands. Takes meloxicam regularly. Severe pain if does not take A/P: to reduce bleeding and  cardiac and kidney risk- if affordable he will trial voltaren gel. If not he will resume current medicines and continue q76month checks including bloodwork.    Major depression This was a situational issue with job loss and caregiver burden. Resolved so will resolve issue.   Hypertension S: controlled on losartan-hctz 100-25mg  BP Readings from Last 3 Encounters:  08/13/16 (!) 118/56  05/23/16 140/90  02/19/16 106/60  A/P:Continue current meds:  Much improved today compared to last visit  Hyperlipidemia S: well controlled on simvastatin 80mg . No myalgias.  Lab Results  Component Value Date   CHOL 120 07/31/2016   HDL 39.40 07/31/2016   LDLCALC 63 07/31/2016   TRIG 89.0 07/31/2016   CHOLHDL 3 07/31/2016   A/P: as has tolerated the 80mg  dose- we opted to monitor only. LFTs ok as well.    Anemia S: mild anemia noted on labs. Has had low platelets 2 visits in a row Lab Results  Component Value Date   WBC 5.4 07/31/2016   HGB 12.3 (L) 07/31/2016   HCT 35.8 (L) 07/31/2016   MCV 94.2 07/31/2016   PLT 136.0 (L) 07/31/2016  A/P: repeat CBC in 1 month- discussed conlonoscopy need but no one to sit with him. He agrees to repeat cbc 1 month- if worsening would really have to start workup. Hematologic? As well with platelets being low  1 month labs. Sees me every 6 months at latest.   Orders Placed This Encounter  Procedures  . CBC    Standing Status:   Future    Standing Expiration Date:   08/13/2017    Meds ordered this encounter  Medications  . diclofenac sodium (VOLTAREN) 1 % GEL    Sig: Apply 2 g topically 4 (four) times daily.    Dispense:  100 g    Refill:  5    Return precautions advised.   Garret Reddish, MD

## 2016-08-13 NOTE — Progress Notes (Signed)
Pre visit review using our clinic review tool, if applicable. No additional management support is needed unless otherwise documented below in the visit note. 

## 2016-08-13 NOTE — Assessment & Plan Note (Signed)
This was a situational issue with job loss and caregiver burden. Resolved so will resolve issue.

## 2016-08-13 NOTE — Assessment & Plan Note (Signed)
S: mild anemia noted on labs. Has had low platelets 2 visits in a row Lab Results  Component Value Date   WBC 5.4 07/31/2016   HGB 12.3 (L) 07/31/2016   HCT 35.8 (L) 07/31/2016   MCV 94.2 07/31/2016   PLT 136.0 (L) 07/31/2016  A/P: repeat CBC in 1 month- discussed conlonoscopy need but no one to sit with him. He agrees to repeat cbc 1 month- if worsening would really have to start workup. Hematologic? As well with platelets being low

## 2016-08-13 NOTE — Assessment & Plan Note (Signed)
S: well controlled on simvastatin 80mg . No myalgias.  Lab Results  Component Value Date   CHOL 120 07/31/2016   HDL 39.40 07/31/2016   LDLCALC 63 07/31/2016   TRIG 89.0 07/31/2016   CHOLHDL 3 07/31/2016   A/P: as has tolerated the 80mg  dose- we opted to monitor only. LFTs ok as well.

## 2016-08-13 NOTE — Assessment & Plan Note (Signed)
S: primarily in his hands. Takes meloxicam regularly. Severe pain if does not take A/P: to reduce bleeding and cardiac and kidney risk- if affordable he will trial voltaren gel. If not he will resume current medicines and continue q53month checks including bloodwork.

## 2016-08-13 NOTE — Patient Instructions (Addendum)
Sign release of information at the check out desk for pneumonia shot records from [facility]  Trial voltaren gel (if affordable) instead of meloxicam as will have less effect on potential for bleeding  Return for lab in 1 month- schedule before you leave. If worsening anemia, will have to find a way to move up colonoscopy- otherwise do that in may. Let us know if worsening fatigue

## 2016-08-13 NOTE — Assessment & Plan Note (Signed)
S:compliant with aspirin, statin, BP control. Asymptomatic other than some fatigue which could be due to anemia. Has not seen cardiology in 2 years.  A/P: I advised patient to schedule cardiology follow up- seems somewhat resistant with financial constraints. He will consider follow up but continue current medications

## 2016-08-13 NOTE — Assessment & Plan Note (Signed)
S: controlled on losartan-hctz 100-25mg  BP Readings from Last 3 Encounters:  08/13/16 (!) 118/56  05/23/16 140/90  02/19/16 106/60  A/P:Continue current meds:  Much improved today compared to last visit

## 2016-08-28 DIAGNOSIS — H2513 Age-related nuclear cataract, bilateral: Secondary | ICD-10-CM | POA: Diagnosis not present

## 2016-09-16 ENCOUNTER — Other Ambulatory Visit: Payer: Self-pay

## 2016-09-27 ENCOUNTER — Other Ambulatory Visit: Payer: Self-pay

## 2016-09-27 MED ORDER — LOSARTAN POTASSIUM-HCTZ 100-25 MG PO TABS: 1.0000 | ORAL_TABLET | Freq: Every day | ORAL | 3 refills | Status: DC

## 2016-09-30 ENCOUNTER — Other Ambulatory Visit (INDEPENDENT_AMBULATORY_CARE_PROVIDER_SITE_OTHER): Payer: Medicare Other

## 2016-09-30 DIAGNOSIS — I10 Essential (primary) hypertension: Secondary | ICD-10-CM | POA: Diagnosis not present

## 2016-09-30 LAB — CBC
HCT: 37.8 % — ABNORMAL LOW (ref 39.0–52.0)
Hemoglobin: 12.8 g/dL — ABNORMAL LOW (ref 13.0–17.0)
MCHC: 33.9 g/dL (ref 30.0–36.0)
MCV: 95.8 fl (ref 78.0–100.0)
Platelets: 141 10*3/uL — ABNORMAL LOW (ref 150.0–400.0)
RBC: 3.95 Mil/uL — ABNORMAL LOW (ref 4.22–5.81)
RDW: 14.2 % (ref 11.5–15.5)
WBC: 5.8 10*3/uL (ref 4.0–10.5)

## 2016-10-01 ENCOUNTER — Other Ambulatory Visit: Payer: Self-pay

## 2016-10-14 ENCOUNTER — Other Ambulatory Visit: Payer: Self-pay

## 2016-10-14 MED ORDER — LOSARTAN POTASSIUM-HCTZ 100-25 MG PO TABS: 1.0000 | ORAL_TABLET | Freq: Every day | ORAL | 3 refills | Status: DC

## 2016-11-20 DIAGNOSIS — I2510 Atherosclerotic heart disease of native coronary artery without angina pectoris: Secondary | ICD-10-CM | POA: Diagnosis not present

## 2016-11-20 DIAGNOSIS — E785 Hyperlipidemia, unspecified: Secondary | ICD-10-CM | POA: Diagnosis not present

## 2016-11-20 DIAGNOSIS — I10 Essential (primary) hypertension: Secondary | ICD-10-CM | POA: Diagnosis not present

## 2016-11-20 LAB — LIPID PANEL
Cholesterol: 134 (ref 0–200)
HDL: 44 (ref 35–70)
LDL Cholesterol: 57
Triglycerides: 183 — AB (ref 40–160)

## 2016-11-20 LAB — CBC AND DIFFERENTIAL
HCT: 39 — AB (ref 41–53)
Hemoglobin: 13.2 — AB (ref 13.5–17.5)
Platelets: 141 — AB (ref 150–399)
WBC: 6.1

## 2016-11-20 LAB — HEPATIC FUNCTION PANEL
ALT: 23 (ref 10–40)
AST: 20 (ref 14–40)
Alkaline Phosphatase: 64 (ref 25–125)
Bilirubin, Total: 0.5

## 2016-11-20 LAB — BASIC METABOLIC PANEL
BUN: 14 (ref 4–21)
Creatinine: 1.2 (ref 0.6–1.3)
Glucose: 72
Potassium: 4 (ref 3.4–5.3)
Sodium: 142 (ref 137–147)

## 2016-12-04 ENCOUNTER — Other Ambulatory Visit: Payer: Self-pay | Admitting: Family Medicine

## 2016-12-20 ENCOUNTER — Other Ambulatory Visit: Payer: Self-pay | Admitting: Family Medicine

## 2017-02-21 ENCOUNTER — Other Ambulatory Visit: Payer: Self-pay | Admitting: Family Medicine

## 2017-03-10 ENCOUNTER — Ambulatory Visit (INDEPENDENT_AMBULATORY_CARE_PROVIDER_SITE_OTHER): Payer: Medicare Other | Admitting: Family Medicine

## 2017-03-10 ENCOUNTER — Encounter: Payer: Self-pay | Admitting: Family Medicine

## 2017-03-10 VITALS — BP 150/78 | HR 59 | Temp 97.9°F | Ht 71.0 in | Wt 225.6 lb

## 2017-03-10 DIAGNOSIS — I10 Essential (primary) hypertension: Secondary | ICD-10-CM

## 2017-03-10 DIAGNOSIS — I2510 Atherosclerotic heart disease of native coronary artery without angina pectoris: Secondary | ICD-10-CM | POA: Diagnosis not present

## 2017-03-10 DIAGNOSIS — Z23 Encounter for immunization: Secondary | ICD-10-CM

## 2017-03-10 MED ORDER — LOSARTAN POTASSIUM 100 MG PO TABS: 100.0000 mg | ORAL_TABLET | Freq: Every day | ORAL | 3 refills | Status: DC

## 2017-03-10 NOTE — Patient Instructions (Addendum)
Start losartan 100mg  back  Follow up in 3-4 weeks.   Try to check blood pressure at home if you can (even at a pharmacy) or home cuff  May need more medicine but we will see at this point.   Cologuard - Roselyn Reef will set you up  Pneumovax 23 today

## 2017-03-10 NOTE — Progress Notes (Signed)
Subjective:  William Park is a 66 y.o. year old very pleasant male patient who presents for/with See problem oriented charting ROS- No chest pain or shortness of breath. No headache or blurry vision.    Past Medical History-  Patient Active Problem List   Diagnosis Date Noted  . CAD (coronary artery disease)     Priority: High  . Gastroesophageal reflux disease without esophagitis 02/19/2016    Priority: Medium  . Hyperlipidemia     Priority: Medium  . Hypertension     Priority: Medium  . GERD (gastroesophageal reflux disease)     Priority: Medium  . Osteoarthritis     Priority: Medium  . Former smoker 01/06/2015    Priority: Low  . Allergic rhinitis     Priority: Low  . Anemia 08/13/2016    Medications- reviewed and updated Current Outpatient Prescriptions  Medication Sig Dispense Refill  . aspirin 81 MG tablet Take 81 mg by mouth daily.    Marland Kitchen esomeprazole (NEXIUM) 40 MG capsule TAKE (1) CAPSULE BY MOUTH ONCE DAILY AT 12 NOON. 30 capsule 5  . fluticasone (FLONASE) 50 MCG/ACT nasal spray Place 2 sprays into both nostrils daily. (Patient taking differently: Place 2 sprays into both nostrils as needed for allergies. ) 48 g 3  . meloxicam (MOBIC) 15 MG tablet TAKE 1 TABLET BY MOUTH ONCE DAILY. 90 tablet 0  . ranitidine (ZANTAC) 150 MG tablet TAKE 1 TABLET BY MOUTH TWICE DAILY 180 tablet 1  . simvastatin (ZOCOR) 80 MG tablet TAKE 1 TABLET BY MOUTH ONCE DAILY. 90 tablet 0  . losartan (COZAAR) 100 MG tablet Take 1 tablet (100 mg total) by mouth daily. 90 tablet 3   No current facility-administered medications for this visit.     Objective: BP (!) 150/78 (BP Location: Left Arm, Patient Position: Sitting, Cuff Size: Large)   Pulse (!) 59   Temp 97.9 F (36.6 C) (Oral)   Ht 5\' 11"  (1.803 m)   Wt 225 lb 9.6 oz (102.3 kg)   SpO2 95%   BMI 31.46 kg/m  Gen: NAD, resting comfortably CV: RRR no murmurs rubs or gallops Lungs: CTAB no crackles, wheeze, rhonchi Abdomen:  soft/nontender/nondistended/normal bowel sounds. No rebound or guarding.  Ext: no edema Skin: warm, dry  Assessment/Plan:  Health Maintenance Due  Topic Date Due  . COLONOSCOPY  07/08/2000  . INFLUENZA VACCINE  02/26/2017    S: poor control today. Out of medicine for 2-3 weeks. It is really unclear why he did not call in for a refill and instead let medicine lapse- I tried to press him on this but could not get a clear answer.  Before our visit today, atient states saw cardiology and was told blood pressure was too low on losartan-hctz 100-25 mg (was having fatigue as well). They stopped the hctz (he believes) Kept him on losartan until he ran out just recently.   Honestly he is not 100% clear which meds were changes or adjusted BP Readings from Last 3 Encounters:  03/10/17 (!) 150/78  08/13/16 (!) 118/56  05/23/16 140/90  A/P: We discussed blood pressure goal of <140/90. I am going to start one med at a time and have close follow up due to reported fatigue/lowBP on losartan 100mg  and hctz 25mg .  Patient Instructions  Start losartan 100mg  back  Follow up in 3-4 weeks.   Try to check blood pressure at home if you can (even at a pharmacy) or home cuff  May need more  medicine but we will see at this point.   Cologuard - Roselyn Reef will set you up  Pneumovax 23 today  Orders Placed This Encounter  Procedures  . Pneumococcal polysaccharide vaccine 23-valent greater than or equal to 2yo subcutaneous/IM    Meds ordered this encounter  Medications  . DISCONTD: losartan (COZAAR) 100 MG tablet  . losartan (COZAAR) 100 MG tablet    Sig: Take 1 tablet (100 mg total) by mouth daily.    Dispense:  90 tablet    Refill:  3    Return precautions advised.  Garret Reddish, MD

## 2017-03-16 DIAGNOSIS — Z1211 Encounter for screening for malignant neoplasm of colon: Secondary | ICD-10-CM | POA: Diagnosis not present

## 2017-03-16 DIAGNOSIS — Z1212 Encounter for screening for malignant neoplasm of rectum: Secondary | ICD-10-CM | POA: Diagnosis not present

## 2017-03-16 LAB — COLOGUARD: Cologuard: NEGATIVE

## 2017-03-24 ENCOUNTER — Encounter: Payer: Self-pay | Admitting: Family Medicine

## 2017-04-07 ENCOUNTER — Ambulatory Visit (INDEPENDENT_AMBULATORY_CARE_PROVIDER_SITE_OTHER): Payer: Medicare Other | Admitting: Family Medicine

## 2017-04-07 ENCOUNTER — Encounter: Payer: Self-pay | Admitting: Family Medicine

## 2017-04-07 VITALS — BP 118/76 | HR 58 | Temp 98.3°F | Resp 20 | Wt 222.0 lb

## 2017-04-07 DIAGNOSIS — Z23 Encounter for immunization: Secondary | ICD-10-CM

## 2017-04-07 DIAGNOSIS — I10 Essential (primary) hypertension: Secondary | ICD-10-CM

## 2017-04-07 DIAGNOSIS — I2510 Atherosclerotic heart disease of native coronary artery without angina pectoris: Secondary | ICD-10-CM | POA: Diagnosis not present

## 2017-04-07 NOTE — Progress Notes (Signed)
Subjective:  William Park is a 67 y.o. year old very pleasant male patient who presents for/with See problem oriented charting ROS- No chest pain or shortness of breath. No headache or blurry vision.    Past Medical History-  Patient Active Problem List   Diagnosis Date Noted  . CAD (coronary artery disease)     Priority: High  . Gastroesophageal reflux disease without esophagitis 02/19/2016    Priority: Medium  . Hyperlipidemia     Priority: Medium  . Hypertension     Priority: Medium  . GERD (gastroesophageal reflux disease)     Priority: Medium  . Osteoarthritis     Priority: Medium  . Former smoker 01/06/2015    Priority: Low  . Allergic rhinitis     Priority: Low  . Anemia 08/13/2016    Medications- reviewed and updated Current Outpatient Prescriptions  Medication Sig Dispense Refill  . aspirin 81 MG tablet Take 81 mg by mouth daily.    Marland Kitchen esomeprazole (NEXIUM) 40 MG capsule TAKE (1) CAPSULE BY MOUTH ONCE DAILY AT 12 NOON. 30 capsule 5  . fluticasone (FLONASE) 50 MCG/ACT nasal spray Place 2 sprays into both nostrils daily. (Patient taking differently: Place 2 sprays into both nostrils as needed for allergies. ) 48 g 3  . losartan (COZAAR) 100 MG tablet Take 1 tablet (100 mg total) by mouth daily. 90 tablet 3  . meloxicam (MOBIC) 15 MG tablet TAKE 1 TABLET BY MOUTH ONCE DAILY. 90 tablet 0  . ranitidine (ZANTAC) 150 MG tablet TAKE 1 TABLET BY MOUTH TWICE DAILY 180 tablet 1  . simvastatin (ZOCOR) 80 MG tablet TAKE 1 TABLET BY MOUTH ONCE DAILY. 90 tablet 0   No current facility-administered medications for this visit.     Objective: BP 118/76   Pulse (!) 58   Temp 98.3 F (36.8 C)   Resp 20   Wt 222 lb (100.7 kg)   SpO2 98%   BMI 30.96 kg/m  Gen: NAD, resting comfortably CV: RRR no murmurs rubs or gallops Lungs: CTAB no crackles, wheeze, rhonchi Abdomen: soft/nontender/nondistended/normal bowel sounds.  Ext: no edema Skin: warm, dry, no rash on exposed  skin Neuro: normal gait  Assessment/Plan:  Hypertension S: controlled on losartan 100mg . Had fatigue on this combined with hctz 25mg .   BP Readings from Last 3 Encounters:  04/07/17 118/76  03/10/17 (!) 150/78  08/13/16 (!) 118/56  A/P: We discussed blood pressure goal of <140/90. Continue current meds. Stay off hctz   Future Appointments Date Time Provider [place]  10/06/2017 10:00 AM Marin Olp, MD [facility] None   Orders Placed This Encounter  Procedures  . Flu vaccine HIGH DOSE PF   Return precautions advised.  Garret Reddish, MD

## 2017-04-07 NOTE — Patient Instructions (Signed)
Continue losartan 100mg . Blood pressure looks much better and is at goal <140/90

## 2017-04-07 NOTE — Assessment & Plan Note (Signed)
S: controlled on losartan 100mg . Had fatigue on this combined with hctz 25mg .   BP Readings from Last 3 Encounters:  04/07/17 118/76  03/10/17 (!) 150/78  08/13/16 (!) 118/56  A/P: We discussed blood pressure goal of <140/90. Continue current meds. Stay off hctz

## 2017-05-20 DIAGNOSIS — E785 Hyperlipidemia, unspecified: Secondary | ICD-10-CM | POA: Diagnosis not present

## 2017-05-20 DIAGNOSIS — I2510 Atherosclerotic heart disease of native coronary artery without angina pectoris: Secondary | ICD-10-CM | POA: Diagnosis not present

## 2017-05-20 DIAGNOSIS — I10 Essential (primary) hypertension: Secondary | ICD-10-CM | POA: Diagnosis not present

## 2017-05-20 LAB — LIPID PANEL
Cholesterol: 109 (ref 0–200)
HDL: 45 (ref 35–70)
LDL Cholesterol: 29
Triglycerides: 177 — AB (ref 40–160)

## 2017-05-20 LAB — HEPATIC FUNCTION PANEL
ALT: 24 (ref 10–40)
AST: 18 (ref 14–40)
Alkaline Phosphatase: 57 (ref 25–125)
Bilirubin, Total: 0.7

## 2017-05-20 LAB — CBC AND DIFFERENTIAL
HCT: 39 — AB (ref 41–53)
Hemoglobin: 13.2 — AB (ref 13.5–17.5)
Platelets: 120 — AB (ref 150–399)
WBC: 5.7

## 2017-05-20 LAB — BASIC METABOLIC PANEL
BUN: 16 (ref 4–21)
Creatinine: 1.2 (ref 0.6–1.3)
Glucose: 116
Potassium: 4.1 (ref 3.4–5.3)
Sodium: 141 (ref 137–147)

## 2017-05-25 ENCOUNTER — Other Ambulatory Visit: Payer: Self-pay | Admitting: Family Medicine

## 2017-06-09 DIAGNOSIS — I2510 Atherosclerotic heart disease of native coronary artery without angina pectoris: Secondary | ICD-10-CM | POA: Diagnosis not present

## 2017-06-09 DIAGNOSIS — R079 Chest pain, unspecified: Secondary | ICD-10-CM | POA: Diagnosis not present

## 2017-06-09 DIAGNOSIS — R001 Bradycardia, unspecified: Secondary | ICD-10-CM | POA: Diagnosis not present

## 2017-06-09 DIAGNOSIS — R55 Syncope and collapse: Secondary | ICD-10-CM | POA: Diagnosis not present

## 2017-06-09 DIAGNOSIS — R0602 Shortness of breath: Secondary | ICD-10-CM | POA: Diagnosis not present

## 2017-06-29 ENCOUNTER — Other Ambulatory Visit: Payer: Self-pay | Admitting: Family Medicine

## 2017-08-05 ENCOUNTER — Other Ambulatory Visit: Payer: Self-pay | Admitting: Family Medicine

## 2017-09-27 DIAGNOSIS — Z87891 Personal history of nicotine dependence: Secondary | ICD-10-CM | POA: Diagnosis not present

## 2017-09-27 DIAGNOSIS — R05 Cough: Secondary | ICD-10-CM | POA: Diagnosis not present

## 2017-09-27 DIAGNOSIS — J40 Bronchitis, not specified as acute or chronic: Secondary | ICD-10-CM | POA: Diagnosis not present

## 2017-10-06 ENCOUNTER — Ambulatory Visit: Payer: Medicare Other | Admitting: Family Medicine

## 2017-11-10 ENCOUNTER — Encounter: Payer: Self-pay | Admitting: Family Medicine

## 2017-11-10 ENCOUNTER — Ambulatory Visit (INDEPENDENT_AMBULATORY_CARE_PROVIDER_SITE_OTHER): Payer: Medicare Other | Admitting: Family Medicine

## 2017-11-10 VITALS — BP 120/78 | HR 65 | Temp 97.3°F | Ht 71.0 in | Wt 218.2 lb

## 2017-11-10 DIAGNOSIS — K219 Gastro-esophageal reflux disease without esophagitis: Secondary | ICD-10-CM | POA: Diagnosis not present

## 2017-11-10 DIAGNOSIS — Z87891 Personal history of nicotine dependence: Secondary | ICD-10-CM

## 2017-11-10 DIAGNOSIS — E785 Hyperlipidemia, unspecified: Secondary | ICD-10-CM | POA: Diagnosis not present

## 2017-11-10 DIAGNOSIS — I2510 Atherosclerotic heart disease of native coronary artery without angina pectoris: Secondary | ICD-10-CM

## 2017-11-10 DIAGNOSIS — I10 Essential (primary) hypertension: Secondary | ICD-10-CM

## 2017-11-10 MED ORDER — MELOXICAM 15 MG PO TABS: 15.0000 mg | ORAL_TABLET | Freq: Every day | ORAL | 1 refills | Status: DC

## 2017-11-10 MED ORDER — FLUTICASONE PROPIONATE 50 MCG/ACT NA SUSP: 2.0000 | Freq: Every day | NASAL | 3 refills | Status: DC

## 2017-11-10 MED ORDER — RANITIDINE HCL 150 MG PO TABS: 150.0000 mg | ORAL_TABLET | Freq: Two times a day (BID) | ORAL | 0 refills | Status: DC

## 2017-11-10 NOTE — Assessment & Plan Note (Signed)
S: controlled on losartan 100mg  BP Readings from Last 3 Encounters:  11/10/17 120/78  04/07/17 118/76  03/10/17 (!) 150/78  A/P: We discussed blood pressure goal of <140/90. Continue current meds

## 2017-11-10 NOTE — Assessment & Plan Note (Signed)
S: reflux controlled on ranitidine 150mg  BID for the most part. Uses nexium as back up- 3-4x a month A/P:   Reasonable control- continue current regimen

## 2017-11-10 NOTE — Assessment & Plan Note (Signed)
Late 2018 had reassuring stress test with [place] [facility]

## 2017-11-10 NOTE — Patient Instructions (Addendum)
I would also like for you to sign up for an annual wellness visit with one of our nurses, Cassie or Manuela Schwartz, who both specialize in the annual wellness visit. This is a free benefit under medicare that may help [place] find additional ways to help you. Some highlights are reviewing medications, lifestyle, and doing a dementia screen.   Sign release of information at the check out desk for last year of office notes, catheterization report and labs from your [facility]ologist  We will call you within a week or two about your referral for aneurysm screening. If you do not hear within 3 weeks, give [place] a call.

## 2017-11-10 NOTE — Progress Notes (Signed)
Subjective:  William Park is a 67 y.o. year old very pleasant male patient who presents for/with See problem oriented charting ROS- No chest pain or shortness of breath. No headache or blurry vision.     Past Medical History-  Patient Active Problem List   Diagnosis Date Noted  . CAD (coronary artery disease)     Priority: High  . Hyperlipidemia     Priority: Medium  . Hypertension     Priority: Medium  . GERD (gastroesophageal reflux disease)     Priority: Medium  . Osteoarthritis     Priority: Medium  . Former smoker 01/06/2015    Priority: Low  . Allergic rhinitis     Priority: Low  . Anemia 08/13/2016    Medications- reviewed and updated Current Outpatient Medications  Medication Sig Dispense Refill  . aspirin 81 MG tablet Take 81 mg by mouth daily.    Marland Kitchen esomeprazole (NEXIUM) 40 MG capsule TAKE (1) CAPSULE BY MOUTH ONCE DAILY AT 12 NOON. 30 capsule 5  . fluticasone (FLONASE) 50 MCG/ACT nasal spray Place 2 sprays into both nostrils daily. 48 g 3  . losartan (COZAAR) 100 MG tablet Take 1 tablet (100 mg total) by mouth daily. 90 tablet 3  . meloxicam (MOBIC) 15 MG tablet Take 1 tablet (15 mg total) by mouth daily. 90 tablet 1  . ranitidine (ZANTAC) 150 MG tablet Take 1 tablet (150 mg total) by mouth 2 (two) times daily. 180 tablet 0  . simvastatin (ZOCOR) 80 MG tablet TAKE 1 TABLET BY MOUTH ONCE DAILY. (Patient taking differently: TAKE 1/2 TABLET BY MOUTH ONCE DAILY.) 90 tablet 1   No current facility-administered medications for this visit.     Objective: BP 120/78 (BP Location: Left Arm, Patient Position: Sitting, Cuff Size: Large)   Pulse 65   Temp (!) 97.3 F (36.3 C) (Oral)   Ht 5\' 11"  (1.803 m)   Wt 218 lb 3.2 oz (99 kg)   SpO2 96%   BMI 30.43 kg/m  Gen: NAD, resting comfortably CV: RRR no murmurs rubs or gallops Lungs: CTAB no crackles, wheeze, rhonchi Abdomen: soft/nontender/nondistended Ext: no edema Skin: warm, dry Neuro: normal gait and  speech  Assessment/Plan: Hypertension S: controlled on losartan 100mg  BP Readings from Last 3 Encounters:  11/10/17 120/78  04/07/17 118/76  03/10/17 (!) 150/78  A/P: We discussed blood pressure goal of <140/90. Continue current meds  Hyperlipidemia S: well controlled on simvastatin 40mg  (half of 80mg  tablet). Sees cardiology as well- they cut him to half tablet- Dr. Bennett Scrape in [place] Lab Results  Component Value Date   CHOL 120 07/31/2016   HDL 39.40 07/31/2016   [place] 63 07/31/2016   TRIG 89.0 07/31/2016   CHOLHDL 3 07/31/2016   A/P: continue current medicine, need to update lipids  CAD (coronary artery disease) Late 2018 had reassuring stress test with [facility]  GERD (gastroesophageal reflux disease) S: reflux controlled on ranitidine 150mg  BID for the most part. Uses nexium as back up- 3-4x a month A/P:   Reasonable control- continue current regimen   Future Appointments  Date Time Provider [place]  11/10/2017  1:45 PM Marin Olp, MD [facility]  12/09/2017 11:00 AM Williemae Area, RN [facility]  05/12/2018  1:45 PM Marin Olp, MD [facility]   Return in about 6 months (around 05/12/2018).  Lab/Order associations: Former smoker - Plan: VAS US AORTA MEDICARE SCREEN  Meds ordered this encounter  Medications  . fluticasone (  FLONASE) 50 MCG/ACT nasal spray    Sig: Place 2 sprays into both nostrils daily.    Dispense:  48 g    Refill:  3  . meloxicam (MOBIC) 15 MG tablet    Sig: Take 1 tablet (15 mg total) by mouth daily.    Dispense:  90 tablet    Refill:  1  . ranitidine (ZANTAC) 150 MG tablet    Sig: Take 1 tablet (150 mg total) by mouth 2 (two) times daily.    Dispense:  180 tablet    Refill:  0   Return precautions advised.  Garret Reddish, MD

## 2017-11-10 NOTE — Assessment & Plan Note (Signed)
S: well controlled on simvastatin 40mg  (half of 80mg  tablet). Sees cardiology as well- they cut him to half tablet- Dr. Bennett Scrape in [place] Lab Results  Component Value Date   CHOL 120 07/31/2016   HDL 39.40 07/31/2016   LDLCALC 63 07/31/2016   TRIG 89.0 07/31/2016   CHOLHDL 3 07/31/2016   A/P: continue current medicine, need to update lipids

## 2017-11-24 ENCOUNTER — Ambulatory Visit (HOSPITAL_COMMUNITY)
Admission: RE | Admit: 2017-11-24 | Discharge: 2017-11-24 | Disposition: A | Discharge status: Home / Self Care | Payer: Medicare Other | Source: Ambulatory Visit | Attending: Cardiovascular Disease | Admitting: Cardiovascular Disease

## 2017-11-24 DIAGNOSIS — Z136 Encounter for screening for cardiovascular disorders: Secondary | ICD-10-CM | POA: Diagnosis not present

## 2017-11-24 DIAGNOSIS — Z87891 Personal history of nicotine dependence: Secondary | ICD-10-CM | POA: Diagnosis not present

## 2017-11-24 DIAGNOSIS — E785 Hyperlipidemia, unspecified: Secondary | ICD-10-CM | POA: Insufficient documentation

## 2017-11-24 DIAGNOSIS — I2510 Atherosclerotic heart disease of native coronary artery without angina pectoris: Secondary | ICD-10-CM | POA: Insufficient documentation

## 2017-11-24 DIAGNOSIS — I10 Essential (primary) hypertension: Secondary | ICD-10-CM | POA: Insufficient documentation

## 2017-11-24 DIAGNOSIS — E669 Obesity, unspecified: Secondary | ICD-10-CM | POA: Diagnosis not present

## 2017-11-25 ENCOUNTER — Telehealth: Payer: Self-pay

## 2017-11-25 NOTE — Telephone Encounter (Signed)
Called patient and gave him his lab results. Patient had no follow up questions Patient verbalized understanding.

## 2017-12-05 ENCOUNTER — Encounter: Payer: Self-pay | Admitting: Family Medicine

## 2017-12-05 LAB — T3, FREE
T3, Free: 2.6
TSH W/REFLEX TO FT4: 1.01

## 2017-12-08 NOTE — Progress Notes (Signed)
PCP notes:  Patient had to leave towards the end of the visit due to receiving a call that his father passed away. Patient asked that I inform Dr Yong Channel of his fathers passing.    Health maintenance: Up to date. Will check with insurance and pharmacy regarding Shingrix vaccine.    Abnormal screenings: None.   Patient concerns: None.   Nurse concerns: None.   Next PCP appt: 05/12/18

## 2017-12-08 NOTE — Progress Notes (Signed)
Subjective:   William Park is a 67 y.o. male who presents for Medicare Annual/Subsequent preventive examination.  Patient still working full time.   Patient living in single family one story home with dog.   Review of Systems:  No ROS.  Medicare Wellness Visit. Additional risk factors are reflected in the social history.  Sleeps 6-7 hrs/night. Wakes up feeling rested. Naps daily.   Cardiac Risk Factors include: dyslipidemia;hypertension;advanced age (>54men, >52 women);male gender     Objective:    Vitals: BP 120/78 Comment: From last visit, patient left prior to being able to get VS.  Pulse 65   Resp 16   Ht 5\' 11"  (1.803 m)   Wt 217 lb 6.4 oz (98.6 kg)   SpO2 98%   BMI 30.32 kg/m   Body mass index is 30.32 kg/m.  Advanced Directives 12/09/2017  Does Patient Have a Medical Advance Directive? Yes  Type of Paramedic of William Park;Living will  Does patient want to make changes to medical advance directive? No - Patient declined  Copy of [place] in Chart? No - copy requested    Tobacco Social History   Tobacco Use  Smoking Status Former Smoker  . Packs/day: 2.00  . Years: 25.00  . Pack years: 50.00  . Types: Cigarettes  . Last attempt to quit: 07/30/1987  . Years since quitting: 30.3  Smokeless Tobacco Never Used     Counseling given: Not Answered  Past Medical History:  Diagnosis Date  . Allergic rhinitis    allegra and flonase  . CAD (coronary artery disease)    Dr. Claiborne Park s/p 4 angioplasty's with last in 35. ASA. simvastatin 80mg   . GERD (gastroesophageal reflux disease)    nexium 40mg  and zantac BID-mout fills with saliva and coughing. Seeing GI in [facility] 12/2014  . Hyperlipidemia    simvastatin 80mg , niaspan 750mg   . Hypertension    losartan 50mg   . Osteoarthritis    mainly hands. Mobic 15mg    Past Surgical History:  Procedure Laterality Date  . ANGIOPLASTY     x4  . pyloric stenosis     as a  child   Family History  Problem Relation Age of Onset  . Dementia Father   . Kidney disease Father        CKD  . CAD Father   . AAA (abdominal aortic aneurysm) Father   . Breast cancer Mother   . Congestive Heart Failure Mother        17 time of death  . CAD Brother    Social History   Socioeconomic History  . Marital status: Divorced    Spouse name: Not on file  . Number of children: Not on file  . Years of education: Not on file  . Highest education level: Not on file  Occupational History  . Not on file  Social Needs  . Financial resource strain: Not on file  . Food insecurity:    Worry: Not on file    Inability: Not on file  . Transportation needs:    Medical: Not on file    Non-medical: Not on file  Tobacco Use  . Smoking status: Former Smoker    Packs/day: 2.00    Years: 25.00    Pack years: 50.00    Types: Cigarettes    Last attempt to quit: 07/30/1987    Years since quitting: 30.3  . Smokeless tobacco: Never Used  Substance and Sexual Activity  .  Alcohol use: No    Alcohol/week: 0.0 oz  . Drug use: No  . Sexual activity: Not on file  Lifestyle  . Physical activity:    Days per week: Not on file    Minutes per session: Not on file  . Stress: Not on file  Relationships  . Social connections:    Talks on phone: Not on file    Gets together: Not on file    Attends religious service: Not on file    Active member of club or organization: Not on file    Attends meetings of clubs or organizations: Not on file    Relationship status: Not on file  Other Topics Concern  . Not on file  Social History Narrative   Family: Divorced. 2 daughters. 3 granddaughters. Doesn't get to see them.       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 aspirin 81 MG tablet Take 81 mg by mouth daily.  Marland Kitchen esomeprazole (NEXIUM) 40 MG capsule TAKE (1) CAPSULE BY  MOUTH ONCE DAILY AT 12 NOON. (Patient taking differently: TAKE (1) CAPSULE BY MOUTH PRN)  . fluticasone (FLONASE) 50 MCG/ACT nasal spray Place 2 sprays into both nostrils daily.  Marland Kitchen losartan (COZAAR) 100 MG tablet Take 1 tablet (100 mg total) by mouth daily.  . meloxicam (MOBIC) 15 MG tablet Take 1 tablet (15 mg total) by mouth daily.  . ranitidine (ZANTAC) 150 MG tablet Take 1 tablet (150 mg total) by mouth 2 (two) times daily.  . simvastatin (ZOCOR) 80 MG tablet TAKE 1 TABLET BY MOUTH ONCE DAILY. (Patient taking differently: TAKE 1/2 TABLET BY MOUTH ONCE DAILY.)   No facility-administered encounter medications on file as of 12/09/2017.     Activities of Daily Living In your present state of health, do you have any difficulty performing the following activities: 12/09/2017  Hearing? N  Vision? N  Difficulty concentrating or making decisions? N  Walking or climbing stairs? N  Dressing or bathing? N  Doing errands, shopping? N  Preparing Food and eating ? N  Using the Toilet? N  In the past six months, have you accidently leaked urine? N  Do you have problems with loss of bowel control? N  Managing your Medications? N  Managing your Finances? N  Housekeeping or managing your Housekeeping? N  Some recent data might be hidden    Patient Care Team: William Olp, MD as PCP - General (Family Medicine)   Assessment:   This is a routine wellness examination for William Park.  Exercise Activities and Dietary recommendations Current Exercise Habits: The patient has a physically strenous job, but has no regular exercise apart from work., Exercise limited by: None identified  4-5 meals/day.  Most meals made at home. Eats a lot of fruit, eats sandwiches, some sweets. Drinks diet soda and regular tea. Does not a drink a whole lot of water.   Goals    None      Fall Risk Fall Risk  12/09/2017 11/10/2017 05/23/2016  Falls in the past year? No No No   Depression Screen PHQ 2/9 Scores  12/09/2017 11/10/2017 05/23/2016 07/21/2015  PHQ - 2 Score 0 0 0 0  PHQ- 9 Score 0 - - -    Cognitive Function Ad8 score reviewed for issues:  Issues making decisions:no  Less interest in hobbies / activities:no  Repeats questions, stories (family complaining):no  Trouble using ordinary gadgets (microwave, computer, phone):no  Forgets the month or year: no  Mismanaging finances: no  Remembering appts:no  Daily problems with thinking and/or memory:no Ad8 score is=0        Immunization History  Administered Date(s) Administered  . Influenza, High Dose Seasonal PF 04/07/2017  . Influenza,inj,Quad PF,6+ Mos 07/21/2015  . Influenza-Unspecified 07/17/2016  . Pneumococcal Conjugate-13 07/21/2015  . Pneumococcal Polysaccharide-23 03/10/2017  . Tdap 01/26/2016   Screening Tests Health Maintenance  Topic Date Due  . Hepatitis C Screening  07/25/2098 (Originally 12/31/1950)  . INFLUENZA VACCINE  02/26/2018  . Fecal DNA (Cologuard)  03/16/2020  . TETANUS/TDAP  01/25/2026  . PNA vac Low Risk Adult  Completed      Plan:   Follow up with PCP as directed.   I have personally reviewed and noted the following in the patient's chart:   . Medical and social history . Use of alcohol, tobacco or illicit drugs  . Current medications and supplements . Functional ability and status . Nutritional status . Physical activity . Advanced directives . List of other physicians . Vitals . Screenings to include cognitive, depression, and falls . Referrals and appointments  In addition, I have reviewed and discussed with patient certain preventive protocols, quality metrics, and best practice recommendations. A written personalized care plan for preventive services as well as general preventive health recommendations were provided to patient.     Williemae Area, RN  12/09/2017

## 2017-12-09 ENCOUNTER — Ambulatory Visit (INDEPENDENT_AMBULATORY_CARE_PROVIDER_SITE_OTHER): Payer: Medicare Other | Admitting: *Deleted

## 2017-12-09 ENCOUNTER — Encounter: Payer: Self-pay | Admitting: *Deleted

## 2017-12-09 VITALS — BP 120/78 | HR 65 | Resp 16 | Ht 71.0 in | Wt 217.4 lb

## 2017-12-09 DIAGNOSIS — Z0000 Encounter for general adult medical examination without abnormal findings: Secondary | ICD-10-CM | POA: Diagnosis not present

## 2017-12-09 NOTE — Patient Instructions (Addendum)
Mr. Dema Severin III ,  Check with your pharmacy or insurance for the Singrix vaccine.   Bring a copy of your living will and/or healthcare power of attorney to your next office visit.  Thank you for taking time to come for your Medicare Wellness Visit. I appreciate your ongoing commitment to your health goals. Please review the following plan we discussed and let me know if I can assist you in the future.   These are the goals we discussed: Goals    None      This is a list of the screening recommended for you and due dates:  Health Maintenance  Topic Date Due  .  Hepatitis C: One time screening is recommended by Center for Disease Control  (CDC) for  adults born from [birth year] through 1966.   07/25/2098*  . Flu Shot  02/26/2018  . Cologuard (Stool DNA test)  03/16/2020  . Tetanus Vaccine  01/25/2026  . Pneumonia vaccines  Completed  *Topic was postponed. The date shown is not the original due date.   Preventive Care for Adults  A healthy lifestyle and preventive care can promote health and wellness. Preventive health guidelines for adults include the following key practices.  . A routine yearly physical is a good way to check with your health care provider about your health and preventive screening. It is a chance to share any concerns and updates on your health and to receive a thorough exam.  . Visit your dentist for a routine exam and preventive care every 6 months. Brush your teeth twice a day and floss once a day. Good oral hygiene prevents tooth decay and gum disease.  . The frequency of eye exams is based on your age, health, family medical history, use  of contact lenses, and other factors. Follow your health care provider's recommendations for frequency of eye exams.  . Eat a healthy diet. Foods like vegetables, fruits, whole grains, low-fat dairy products, and lean protein foods contain the nutrients you need without too many calories. Decrease your intake of foods high in solid  fats, added sugars, and salt. Eat the right amount of calories for you. Get information about a proper diet from your health care provider, if necessary.  . Regular physical exercise is one of the most important things you can do for your health. Most adults should get at least 150 minutes of moderate-intensity exercise (any activity that increases your heart rate and causes you to sweat) each week. In addition, most adults need muscle-strengthening exercises on 2 or more days a week.  Silver Sneakers may be a benefit available to you. To determine eligibility, you may visit the website: www.silversneakers.com or contact program at (307) 839-3827 Mon-Fri between 8AM-8PM.   . Maintain a healthy weight. The body mass index (BMI) is a screening tool to identify possible weight problems. It provides an estimate of body fat based on height and weight. Your health care provider can find your BMI and can help you achieve or maintain a healthy weight.   For adults 20 years and older: ? A BMI below 18.5 is considered underweight. ? A BMI of 18.5 to 24.9 is normal. ? A BMI of 25 to 29.9 is considered overweight. ? A BMI of 30 and above is considered obese.   . Maintain normal blood lipids and cholesterol levels by exercising and minimizing your intake of saturated fat. Eat a balanced diet with plenty of fruit and vegetables. Blood tests for lipids and cholesterol should begin  at age 34 and be repeated every 5 years. If your lipid or cholesterol levels are high, you are over 50, or you are at high risk for heart disease, you may need your cholesterol levels checked more frequently. Ongoing high lipid and cholesterol levels should be treated with medicines if diet and exercise are not working.  . If you smoke, find out from your health care provider how to quit. If you do not use tobacco, please do not start.  . If you choose to drink alcohol, please do not consume more than 2 drinks per day. One drink is  considered to be 12 ounces (355 mL) of beer, 5 ounces (148 mL) of wine, or 1.5 ounces (44 mL) of liquor.  . If you are 81-56 years old, ask your health care provider if you should take aspirin to prevent strokes.  . Use sunscreen. Apply sunscreen liberally and repeatedly throughout the day. You should seek shade when your shadow is shorter than you. Protect yourself by wearing long sleeves, pants, a wide-brimmed hat, and sunglasses year round, whenever you are outdoors.  . Once a month, do a whole body skin exam, using a mirror to look at the skin on your back. Tell your health care provider of new moles, moles that have irregular borders, moles that are larger than a pencil eraser, or moles that have changed in shape or color.

## 2017-12-09 NOTE — Progress Notes (Signed)
I have reviewed and agree with note, evaluation, plan.  I am going to try to reach out to him tomorrow about his father's passing.  Garret Reddish, MD

## 2017-12-17 ENCOUNTER — Telehealth: Payer: Self-pay | Admitting: Family Medicine

## 2017-12-17 NOTE — Telephone Encounter (Signed)
See note, call patient back if a call was made.   Copied from [place] [id number]. Topic: Quick Communication - Office Called Patient >> Dec 17, 2017 12:29 PM Synthia Innocent wrote: Reason for CRM: returning call, if no answer leave detail message

## 2017-12-18 NOTE — Telephone Encounter (Signed)
Spoke with patient and told him I was sorry about the loss of his father and thanked him for allowing me to be his father's physician

## 2018-02-10 DIAGNOSIS — E785 Hyperlipidemia, unspecified: Secondary | ICD-10-CM | POA: Diagnosis not present

## 2018-02-10 DIAGNOSIS — I10 Essential (primary) hypertension: Secondary | ICD-10-CM | POA: Diagnosis not present

## 2018-02-10 DIAGNOSIS — Z125 Encounter for screening for malignant neoplasm of prostate: Secondary | ICD-10-CM | POA: Diagnosis not present

## 2018-02-10 DIAGNOSIS — I2510 Atherosclerotic heart disease of native coronary artery without angina pectoris: Secondary | ICD-10-CM | POA: Diagnosis not present

## 2018-02-10 LAB — CBC AND DIFFERENTIAL
HCT: 42 (ref 41–53)
Hemoglobin: 14.4 (ref 13.5–17.5)
Platelets: 127 — AB (ref 150–399)
WBC: 6.6

## 2018-02-10 LAB — LIPID PANEL
Cholesterol: 166 (ref 0–200)
HDL: 46 (ref 35–70)
LDL Cholesterol: 76
Triglycerides: 220 — AB (ref 40–160)

## 2018-02-10 LAB — BASIC METABOLIC PANEL
BUN: 19 (ref 4–21)
Creatinine: 1.5 — AB (ref 0.6–1.3)
Potassium: 4.4 (ref 3.4–5.3)
Sodium: 140 (ref 137–147)

## 2018-02-10 LAB — HEPATIC FUNCTION PANEL
ALT: 23 (ref 10–40)
AST: 20 (ref 14–40)
Alkaline Phosphatase: 73 (ref 25–125)
Bilirubin, Total: 0.6

## 2018-02-10 LAB — PSA: PSA: 0.73

## 2018-02-12 ENCOUNTER — Encounter: Payer: Self-pay | Admitting: Family Medicine

## 2018-02-24 DIAGNOSIS — E785 Hyperlipidemia, unspecified: Secondary | ICD-10-CM | POA: Diagnosis not present

## 2018-02-24 DIAGNOSIS — I10 Essential (primary) hypertension: Secondary | ICD-10-CM | POA: Diagnosis not present

## 2018-03-10 DIAGNOSIS — M79671 Pain in right foot: Secondary | ICD-10-CM | POA: Diagnosis not present

## 2018-03-10 DIAGNOSIS — B351 Tinea unguium: Secondary | ICD-10-CM | POA: Diagnosis not present

## 2018-03-10 DIAGNOSIS — M722 Plantar fascial fibromatosis: Secondary | ICD-10-CM | POA: Diagnosis not present

## 2018-03-15 ENCOUNTER — Other Ambulatory Visit: Payer: Self-pay | Admitting: Family Medicine

## 2018-04-14 DIAGNOSIS — M79671 Pain in right foot: Secondary | ICD-10-CM | POA: Diagnosis not present

## 2018-04-14 DIAGNOSIS — M722 Plantar fascial fibromatosis: Secondary | ICD-10-CM | POA: Diagnosis not present

## 2018-05-12 ENCOUNTER — Ambulatory Visit (INDEPENDENT_AMBULATORY_CARE_PROVIDER_SITE_OTHER): Payer: Medicare Other | Admitting: Family Medicine

## 2018-05-12 ENCOUNTER — Encounter: Payer: Self-pay | Admitting: Family Medicine

## 2018-05-12 VITALS — BP 108/82 | HR 60 | Temp 98.4°F | Ht 71.0 in | Wt 217.6 lb

## 2018-05-12 DIAGNOSIS — I10 Essential (primary) hypertension: Secondary | ICD-10-CM

## 2018-05-12 DIAGNOSIS — F325 Major depressive disorder, single episode, in full remission: Secondary | ICD-10-CM

## 2018-05-12 DIAGNOSIS — I2510 Atherosclerotic heart disease of native coronary artery without angina pectoris: Secondary | ICD-10-CM

## 2018-05-12 DIAGNOSIS — E785 Hyperlipidemia, unspecified: Secondary | ICD-10-CM

## 2018-05-12 DIAGNOSIS — K219 Gastro-esophageal reflux disease without esophagitis: Secondary | ICD-10-CM | POA: Diagnosis not present

## 2018-05-12 DIAGNOSIS — M79671 Pain in right foot: Secondary | ICD-10-CM

## 2018-05-12 DIAGNOSIS — Z23 Encounter for immunization: Secondary | ICD-10-CM

## 2018-05-12 LAB — BASIC METABOLIC PANEL
BUN: 22 mg/dL (ref 6–23)
CO2: 32 mEq/L (ref 19–32)
Calcium: 9.6 mg/dL (ref 8.4–10.5)
Chloride: 102 mEq/L (ref 96–112)
Creatinine, Ser: 1.17 mg/dL (ref 0.40–1.50)
GFR: 65.92 mL/min (ref >60.00)
Glucose, Bld: 98 mg/dL (ref 70–99)
Potassium: 4.3 mEq/L (ref 3.5–5.1)
Sodium: 139 mEq/L (ref 135–145)

## 2018-05-12 LAB — CBC WITH DIFFERENTIAL/PLATELET
Basophils Absolute: 0 10*3/uL (ref 0.0–0.1)
Basophils Relative: 0.3 % (ref 0.0–3.0)
Eosinophils Absolute: 0.1 10*3/uL (ref 0.0–0.7)
Eosinophils Relative: 2 % (ref 0.0–5.0)
HCT: 41.7 % (ref 39.0–52.0)
Hemoglobin: 14.1 g/dL (ref 13.0–17.0)
Lymphocytes Relative: 45.4 % (ref 12.0–46.0)
Lymphs Abs: 3.1 10*3/uL (ref 0.7–4.0)
MCHC: 33.8 g/dL (ref 30.0–36.0)
MCV: 96.9 fl (ref 78.0–100.0)
Monocytes Absolute: 0.5 10*3/uL (ref 0.1–1.0)
Monocytes Relative: 7.4 % (ref 3.0–12.0)
Neutro Abs: 3.1 10*3/uL (ref 1.4–7.7)
Neutrophils Relative %: 44.9 % (ref 43.0–77.0)
Platelets: 129 10*3/uL — ABNORMAL LOW (ref 150.0–400.0)
RBC: 4.3 Mil/uL (ref 4.22–5.81)
RDW: 14 % (ref 11.5–15.5)
WBC: 6.9 10*3/uL (ref 4.0–10.5)

## 2018-05-12 MED ORDER — FAMOTIDINE 20 MG PO TABS
20.0000 mg | ORAL_TABLET | Freq: Two times a day (BID) | ORAL | 1 refills | Status: DC
Start: 2018-05-12 — End: 2018-11-10

## 2018-05-12 NOTE — Progress Notes (Addendum)
Subjective:  William Park is a 67 y.o. year old very pleasant male patient who presents for/with See problem oriented charting ROS- No chest pain or shortness of breath. No headache or blurry vision.  Occasional reflux if spicy foods eaten.  Does have right foot pain.   Past Medical History-  Patient Active Problem List   Diagnosis Date Noted  . CAD (coronary artery disease)     Priority: High  . Hyperlipidemia     Priority: Medium  . Hypertension     Priority: Medium  . GERD (gastroesophageal reflux disease)     Priority: Medium  . Osteoarthritis     Priority: Medium  . Former smoker 01/06/2015    Priority: Low  . Allergic rhinitis     Priority: Low  . Anemia 08/13/2016    Medications- reviewed and updated Current Outpatient Medications  Medication Sig Dispense Refill  . aspirin 81 MG tablet Take 81 mg by mouth daily.    Marland Kitchen esomeprazole (NEXIUM) 40 MG capsule TAKE (1) CAPSULE BY MOUTH ONCE DAILY AT 12 NOON. (Patient taking differently: TAKE (1) CAPSULE BY MOUTH PRN) 30 capsule 5  . fluticasone (FLONASE) 50 MCG/ACT nasal spray Place 2 sprays into both nostrils daily. 48 g 3  . losartan (COZAAR) 100 MG tablet TAKE 1 TABLET BY MOUTH ONCE DAILY. 90 tablet 3  . meloxicam (MOBIC) 15 MG tablet Take 1 tablet (15 mg total) by mouth daily. 90 tablet 1  . simvastatin (ZOCOR) 80 MG tablet TAKE 1 TABLET BY MOUTH ONCE DAILY. (Patient taking differently: Take 40 mg by mouth daily at 6 PM. ) 90 tablet 1  . famotidine (PEPCID) 20 MG tablet Take 1 tablet (20 mg total) by mouth 2 (two) times daily. 180 tablet 1   No current facility-administered medications for this visit.     Objective: BP 108/82 (BP Location: Left Arm, Patient Position: Sitting, Cuff Size: Large)   Pulse 60   Temp 98.4 F (36.9 C) (Oral)   Ht 5\' 11"  (1.803 m)   Wt 217 lb 9.6 oz (98.7 kg)   SpO2 96%   BMI 30.35 kg/m  Gen: NAD, resting comfortably CV: RRR no murmurs rubs or gallops Lungs: CTAB no crackles,  wheeze, rhonchi Abdomen: soft/nontender/nondistended/normal bowel sounds.   Ext: no edema Skin: warm, dry Msk: Pain over head of fifth metatarsal-some edema also noted in area.  No pain at insertion point of plantar fascia  Assessment/Plan:  Other notes: 1. saw podiatrist and thought plantar fasciitis- treated with steroid shot in the heel a month or 1.5 months ago. Once heel started hurting he started having pain throughout the midfoot and that has persisted for several weeks. fronzen water bottle on it helps some. Was advised to come back if persisted. Plan was to consider course of steroids. Meloxicam doesn't help his foot- just his hands. Went back for second visit and even though pain wasn't in plantar fascia told him to use water bottle frozen and run his foot over this-this has not really helped his pain. Patient would like a second opinion on his right foot pain-referred to Dr. Paulla Fore today. 2. With continued mobic use- need to check kidney function. He is also aware that mobic increases cardiac risk but simply  Cant tolerate the pain without it- such as having hard time putting credit card in gas pump.  I am hopeful kidney function has improved from last visit when creatinine went up to 1.5 as would be tough to stop the  meloxicam. I had ordered voltaren for him in past-cannot recall if ineffective oriented to expensive but regardless he is not on it at this point.  Might be worth touching base on this potential option if needed in future.  Hypertension S: controlled on losartan 100mg  BP Readings from Last 3 Encounters:  05/12/18 108/82  12/09/17 120/78  11/10/17 120/78  A/P: We discussed blood pressure goal of <140/90. Continue current meds   Hyperlipidemia S: reasonably controlled on simvastatin 40mg  (half of 80mg  tablet) with last LDL at 76- hoping to get this under 70 with healthy eating/regular exercise- hasnt done well ont his lately. Weight at least stable from last visit Lab  Results  Component Value Date   CHOL 166 02/10/2018   HDL 46 02/10/2018   LDLCALC 76 02/10/2018   TRIG 220 (A) 02/10/2018   CHOLHDL 3 07/31/2016   A/P: continue current rx.  Advised lifestyle changes needed  CAD (coronary artery disease) S: follows with [facility]. Remains on statin and aspirin. asymptomatic A/P: continue current rx  GERD (gastroesophageal reflux disease) S: taking ranitidine 1 tablet a day and not taking nexium unless eats something spicy A/P: encouraged him to change to pepcid due to no recalls on pepcid  Major depressive disorder with single episode, in full remission ([place]) S: last phq9 in may was well controlled. Despite loss of father- has been doing well. Prior depression was situational related to job loss A/P: remains in remission- continue to follow q6-12 months   Lab/Order associations: Need for prophylactic vaccination and inoculation against influenza - Plan: Flu vaccine HIGH DOSE PF  Right foot pain - Plan: Ambulatory referral to Sports Medicine  Essential hypertension - Plan: Basic metabolic panel, CBC with Differential/Platelet  Hyperlipidemia, unspecified hyperlipidemia type  Coronary artery disease involving native coronary artery of native heart without angina pectoris  Gastroesophageal reflux disease, esophagitis presence not specified  Meds ordered this encounter  Medications  . famotidine (PEPCID) 20 MG tablet    Sig: Take 1 tablet (20 mg total) by mouth 2 (two) times daily.    Dispense:  180 tablet    Refill:  1    Return precautions advised.  Garret Reddish, MD

## 2018-05-12 NOTE — Patient Instructions (Addendum)
Please schedule a visit with our excellent sports medicine physician Dr. Paulla Fore before you leave at the check out desk so he can further evaluate your right foot pain  No changes in meds today other than stopping ranitidine and substituting pepcid/famotidine due to recall  Please stop by lab before you go

## 2018-05-12 NOTE — Assessment & Plan Note (Signed)
S: reasonably controlled on simvastatin 40mg  (half of 80mg  tablet) with last LDL at 76- hoping to get this under 70 with healthy eating/regular exercise- hasnt done well ont his lately. Weight at least stable from last visit Lab Results  Component Value Date   CHOL 166 02/10/2018   HDL 46 02/10/2018   LDLCALC 76 02/10/2018   TRIG 220 (A) 02/10/2018   CHOLHDL 3 07/31/2016   A/P: continue current rx.  Advised lifestyle changes needed

## 2018-05-12 NOTE — Assessment & Plan Note (Signed)
S: follows with [facility]. Remains on statin and aspirin. asymptomatic A/P: continue current rx

## 2018-05-12 NOTE — Assessment & Plan Note (Signed)
S: taking ranitidine 1 tablet a day and not taking nexium unless eats something spicy A/P: encouraged him to change to pepcid due to no recalls on pepcid

## 2018-05-12 NOTE — Assessment & Plan Note (Signed)
S: last phq9 in may was well controlled. Despite loss of father- has been doing well. Prior depression was situational related to job loss A/P: remains in remission- continue to follow q6-12 months

## 2018-05-18 ENCOUNTER — Ambulatory Visit: Payer: Self-pay

## 2018-05-18 ENCOUNTER — Ambulatory Visit (INDEPENDENT_AMBULATORY_CARE_PROVIDER_SITE_OTHER): Payer: Medicare Other | Admitting: Sports Medicine

## 2018-05-18 ENCOUNTER — Encounter: Payer: Self-pay | Admitting: Sports Medicine

## 2018-05-18 VITALS — BP 120/74 | HR 59 | Ht 71.0 in | Wt 220.6 lb

## 2018-05-18 DIAGNOSIS — M7671 Peroneal tendinitis, right leg: Secondary | ICD-10-CM | POA: Diagnosis not present

## 2018-05-18 DIAGNOSIS — I2510 Atherosclerotic heart disease of native coronary artery without angina pectoris: Secondary | ICD-10-CM | POA: Diagnosis not present

## 2018-05-18 DIAGNOSIS — M79671 Pain in right foot: Secondary | ICD-10-CM

## 2018-05-18 DIAGNOSIS — H2513 Age-related nuclear cataract, bilateral: Secondary | ICD-10-CM | POA: Diagnosis not present

## 2018-05-18 MED ORDER — NITROGLYCERIN 0.2 MG/HR TD PT24: MEDICATED_PATCH | TRANSDERMAL | 1 refills | Status: DC

## 2018-05-18 NOTE — Progress Notes (Signed)
Juanda Bond. [place], [place] at [place] - 67 y.o. male MRN [id number]  Date of birth: 1951/03/01  Visit Date: 05/18/2018  PCP: Marin Olp, MD   Referred by: Marin Olp, MD   Scribe(s) for today's visit: Josepha Pigg, CMA  SUBJECTIVE:  [place] "jimmy" is here for Initial Assessment (R foot pain)  Referred by: Dr. Garret Reddish  HPI: 05/18/2018: His R foot pain symptoms INITIALLY: Began several months ago and started after receiving steroid injection.  Described as mild aching and throbbing at rest, pain can be moderate-severe when weightbearing and seems to be more sharp. Pain will radiate to the lateral aspect of the ankle after being on his feet for prolonged period of time.  Worsened with weightbearing.  Improved with rest.  Additional associated symptoms include: He has been told that he has "weak ankles", had a bad sprain of the R ankle about 14 yrs ago.     At this time symptoms show no change compared to onset.  He has been taking Mobic with minimal relief. He reports minimal relief with Tylenol. He rolls foot over frozen bottle of water and hasn't noticed any change in sx.  He received 2 injections with podiatry ([facility]) for heel pain. Since then he has noticed increased pain and tenderness to palpation on the lateral aspect of his foot. Injections were done around August 2019. He reports that heel pain has improved but 2-3 after the other pains started.   He had XR done with podiatry - was told XR was normal.   REVIEW OF SYSTEMS: Denies night time disturbances. Denies fevers, chills, or night sweats. Denies unexplained weight loss. Denies personal history of cancer. Denies changes in bowel or bladder habits. Denies recent unreported falls. Denies new or worsening dyspnea or wheezing. Denies headaches or dizziness.  Denies numbness, tingling  or weakness  In the extremities.  Denies dizziness or presyncopal episodes Denies lower extremity edema    HISTORY:  Prior history reviewed and updated per electronic medical record.  Social History   Occupational History  . Not on file  Tobacco Use  . Smoking status: Former Smoker    Packs/day: 2.00    Years: 25.00    Pack years: 50.00    Types: Cigarettes    Last attempt to quit: 07/30/1987    Years since quitting: 30.8  . Smokeless tobacco: Never Used  Substance and Sexual Activity  . Alcohol use: No    Alcohol/week: 0.0 standard drinks  . Drug use: No  . Sexual activity: Not on file   Social History   Social History Narrative   Family: Divorced. 2 daughters. 3 granddaughters. Doesn't get to see them.       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 Allergic rhinitis    allegra and flonase  . CAD (coronary artery disease)    Dr. [name] s/p 4 angioplasty's with last in 40. ASA. simvastatin 80mg   . GERD (gastroesophageal reflux disease)    nexium 40mg  and zantac BID-mout fills with saliva and coughing. [facility] GI in [facility] 12/2014  . Hyperlipidemia    simvastatin 80mg , niaspan 750mg   . Hypertension    losartan 50mg   . Osteoarthritis    mainly hands. Mobic 15mg    Past Surgical  History:  Procedure Laterality Date  . ANGIOPLASTY     x4  . pyloric stenosis     as a child   family history includes AAA (abdominal aortic aneurysm) in his father; Breast cancer in his mother; CAD in his brother and father; Congestive Heart Failure in his mother; Dementia in his father; Kidney disease in his father.  DATA OBTAINED & REVIEWED:  No results for input(s): HGBA1C, LABURIC, CREATINE in the last 8760 hours. . Patient reported outside x-rays of his right foot with podiatry were normal in September 2019. .   OBJECTIVE:  VS:  HT:5\' 11"  (180.3 cm)   WT:220 lb 9.6 oz (100.1 kg)   BMI:30.78    BP:120/74  HR:(!) 59bpm  TEMP: ( )  RESP:94 %   PHYSICAL EXAM: CONSTITUTIONAL: Well-developed, Well-nourished and In no acute distress PSYCHIATRIC: Alert & appropriately interactive. and Not depressed or anxious appearing. RESPIRATORY: No increased work of breathing and Trachea Midline EYES: Pupils are equal., EOM intact without nystagmus. and No scleral icterus.  VASCULAR EXAM: Warm and well perfused NEURO: unremarkable  MSK Exam: Right foot  Well aligned.  Generalized swelling of the entire hindfoot that is mild in nature. No overlying skin changes. TTP over Base of the fifth metatarsal on the right.  Mild pain with palpation over the peroneal tendons.  No significant pain at the level of the lateral malleolus although thickening appreciated.   RANGE OF MOTION & STRENGTH  Slightly limited dorsiflexion of bilateral ankles.  Good inversion, eversion, talar tilting range of motion.   SPECIALITY TESTING:  Stable ankle drawer test with solid endpoint but 3 to 4 mm of anterior translation.  Normal Klieger testing and cotton testing.  Dorsiflexion, plantarflexion, inversion and eversion strength are intact.     ASSESSMENT   1. Right foot pain   2. Peroneal tendinitis, right     PLAN:  Pertinent additional documentation may be included in corresponding procedure notes, imaging studies, problem based documentation and patient instructions.  Procedures:  . Discussed the foundation of treatment for this condition is physical therapy and/or daily (5-6 days/week) therapeutic exercises, focusing on core strengthening, coordination, neuromuscular control/reeducation.  Therapeutic exercises prescribed per procedure note.  Medications:  Meds ordered this encounter  Medications  . nitroGLYCERIN (NITRODUR - DOSED IN MG/24 HR) 0.2 mg/hr patch    Sig: Place 1/4 to 1/2 of a patch over affected region. Remove and replace once daily.  Slightly alter skin placement daily     Dispense:  30 patch    Refill:  1    For musculoskeletal purposes.  Okay to cut patch.   Discussion/Instructions: Peroneal tendinitis, right He is status post plantar fascial injections and is likely irritated his peroneal tendons with Alfredson protocol.  There is marked thickening of peroneal tendons at the level of the lateral malleolus with minimal neovascularity.  This will benefit from nitroglycerin protocol.  Therapeutic exercises per procedure note Discussed that the anticipated amount of time for healing is 12- 24 weeks for Tendinopathic changes.  Emphasized the importance of improving blood flow as well as eccentric loading of the tendon.  We will plan to check in in 6 weeks and repeat ultrasound at that time.  Discussed options with the patient today including biologic treatment with topical nitroglycerin. Patient has no contraindications & understands the risks, benefits and intentions of treatment. Emphasized the importance of rotating sites as well as appropriate and expected adverse reactions including orthostasis, headache, adhesive sensitivity. Begin with 1/4 patch to  the affected area. Okay to titrate to half a patch as tolerated.  . Body Helix Compression Sleeve provided today per AVS . Discussed red flag symptoms that warrant earlier emergent evaluation and patient voices understanding. . Activity modifications and the importance of avoiding exacerbating activities (limiting pain to no more than a 4 / 10 during or following activity) recommended and discussed.  Follow-up:  . Return in about 6 weeks (around 06/29/2018).   . If any lack of improvement consider: Biologic Therapy with PRP and Or corticosteroid tendon sheath injection     CMA/ATC served as scribe during this visit. History, Physical, and Plan performed by medical provider. Documentation and orders reviewed and attested to.      Gerda Diss, [facility] Physician

## 2018-05-18 NOTE — Progress Notes (Signed)

## 2018-05-18 NOTE — Patient Instructions (Addendum)
Nitroglycerin Protocol   Apply 1/4 nitroglycerin patch to affected area daily.  Change position of patch within the affected area every 24 hours.  You may experience a headache during the first 1-2 weeks of using the patch, these should subside.  If you experience headaches after beginning nitroglycerin patch treatment, you may take your preferred over the counter pain reliever.  Another side effect of the nitroglycerin patch is skin irritation or rash related to patch adhesive.  Please notify our office if you develop more severe headaches or rash, and stop the patch.  Tendon healing with nitroglycerin patch may require 12 to 24 weeks depending on the extent of injury.  Men should not use if taking Viagra, Cialis, or Levitra.   Do not use if you have migraines or rosacea.    I recommend you obtained a compression sleeve to help with your joint problems. There are many options on the market however I recommend obtaining a ankle Body Helix compression sleeve.  You can find information (including how to appropriate measure yourself for sizing) can be found at www.Body http://www.lambert.com/.  Many of these products are health savings account (HSA) eligible.   You can use the compression sleeve at any time throughout the day but is most important to use while being active as well as for 2 hours post-activity.   It is appropriate to ice following activity with the compression sleeve in place.   Please perform the exercise program that we have prepared for you and gone over in detail on a daily basis.  In addition to the handout you were provided you can access your program through: www.my-exercise-code.com   Your unique program code is: [email or web address]   Frequent icing is recommended.  You can ice for 10-15 minutes at a time 3-4 times per day if needed.  It is important to be sure to ice immediately after activity. For the foot and ankle it is sometimes easier and more effective to soak your foot in a bucket  of cool water.   The water should not be miserably cold, a good rule to go by his if there is any ice floating by the end of the 10-15 minutes it was likely too cold.

## 2018-05-18 NOTE — Procedures (Signed)
LIMITED MSK ULTRASOUND OF Right ankle Images were obtained and interpreted by myself, Teresa Coombs, DO  Images have been saved and stored to PACS system. Images obtained on: GE S7 Ultrasound machine  FINDINGS:   Peroneal tendons are markedly thickened at the level of the lateral malleolus.  There is minimal neovascularity.  At the insertion into the base of the fifth metatarsal there is overlying subcutaneous edema.  Prominent vein.  IMPRESSION:  1. Peroneal tendinopathy

## 2018-05-18 NOTE — Assessment & Plan Note (Signed)
He is status post plantar fascial injections and is likely irritated his peroneal tendons with Alfredson protocol.  There is marked thickening of peroneal tendons at the level of the lateral malleolus with minimal neovascularity.  This will benefit from nitroglycerin protocol.  Therapeutic exercises per procedure note Discussed that the anticipated amount of time for healing is 12- 24 weeks for Tendinopathic changes.  Emphasized the importance of improving blood flow as well as eccentric loading of the tendon.  We will plan to check in in 6 weeks and repeat ultrasound at that time.  Discussed options with the patient today including biologic treatment with topical nitroglycerin. Patient has no contraindications & understands the risks, benefits and intentions of treatment. Emphasized the importance of rotating sites as well as appropriate and expected adverse reactions including orthostasis, headache, adhesive sensitivity. Begin with 1/4 patch to the affected area. Okay to titrate to half a patch as tolerated.

## 2018-06-13 ENCOUNTER — Other Ambulatory Visit: Payer: Self-pay | Admitting: Family Medicine

## 2018-06-29 ENCOUNTER — Ambulatory Visit (INDEPENDENT_AMBULATORY_CARE_PROVIDER_SITE_OTHER): Payer: Medicare Other | Admitting: Sports Medicine

## 2018-06-29 ENCOUNTER — Encounter: Payer: Self-pay | Admitting: Sports Medicine

## 2018-06-29 VITALS — BP 124/78 | HR 56 | Ht 71.0 in | Wt 223.8 lb

## 2018-06-29 DIAGNOSIS — I2510 Atherosclerotic heart disease of native coronary artery without angina pectoris: Secondary | ICD-10-CM

## 2018-06-29 DIAGNOSIS — M7671 Peroneal tendinitis, right leg: Secondary | ICD-10-CM | POA: Diagnosis not present

## 2018-06-29 DIAGNOSIS — M79671 Pain in right foot: Secondary | ICD-10-CM

## 2018-06-29 NOTE — Progress Notes (Signed)
Juanda Bond. Ket[place], [place] at [place] - 67 y.o. male MRN [id number]  Date of birth: [birth year]  Visit Date:   PCP: Marin Olp, MD   Referred by: Marin Olp, MD   SUBJECTIVE:  [place] "jimmy" is here for f/u R foot pain (Slight improvement since last visit. Following Nitro Protocol with no side effects. Doing HEP, 4 way ankle with no trouble. Taking Mobic and/or Tylenol with minimal relief. Wearing Body Helix and doing cool water soaks a couple times weekly. )   HPI: Patient is here for a 6-week follow-up of his right lateral ankle pain diagnosed previously has peroneal tendinitis/tendinopathy.  Has been using a nitroglycerin protocol as well as body looks good improvement.  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  He denies any burning swelling numbness or weakness.  Occasionally he does get some pain radiating down towards the lower leg.  This is worsened with activity including truck driving as well as weightbearing and prolonged walking.  It is helped with rest.  Takes occasional ibuprofen and Tylenol but this is only intermittent and is using nitroglycerin protocol most days as well as performing home therapeutic exercises 3 to 4 days/week.  He has used in the body helix consistently.  REVIEW OF SYSTEMS: Denies night time disturbances. Denies fevers, chills, or night sweats. Denies unexplained weight loss. Denies personal history of cancer. Denies changes in bowel or bladder habits. Denies recent unreported falls. Denies new or worsening dyspnea or wheezing. Denies headaches or dizziness.  Denies numbness, tingling or weakness  In the extremities.  Denies dizziness or presyncopal episodes Denies lower extremity edema    HISTORY:  Prior history reviewed and updated per electronic  medical record.  Social History  Occupational History  . Not on file  Tobacco Use  . Smoking status: Former Smoker    Packs/day: 2.00    Years: 25.00    Pack years: 50.00    Types: Cigarettes    Last attempt to quit: 07/30/1987    Years since quitting: 30.9  . Smokeless tobacco: Never Used  Substance and Sexual Activity  . Alcohol use: No    Alcohol/week: 0.0 standard drinks  . Drug use: No  . Sexual activity: Not on file   Social History  Social History Narrative   Family: Divorced. 2 daughters. 3 granddaughters. Doesn't get to see them.       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 No specialty comments available.  OBJECTIVE:  VS:  HT:5\' 11"  (180.3 cm)   WT:223 lb 12.8 oz (101.5 kg)  BMI:31.23    BP:124/78  HR: (!) 56 bpm  TEMP: ( )  RESP:96 %   PHYSICAL EXAM: CONSTITUTIONAL: Well-developed, Well-nourished and In no acute distress PSYCHIATRIC : Alert & appropriately interactive. and Not depressed or anxious appearing. RESPIRATORY : No increased work of breathing and Trachea Midline EYES : Pupils are equal., EOM intact without nystagmus. and No scleral icterus.  VASCULAR EXAM : Warm and well perfused NEURO:  unremarkable  MSK Exam:  Right ankle  Well aligned, no significant deformity. No overlying skin changes. TTP over Peroneal tendons at the base of the fifth metatarsal but this is mild only. No swelling No effusion Normal, non-painful Inversion, eversion, plantarflexion and dorsiflexion strength and range. Ligamentously stable Only very small amount of focal swelling over the peroneal tendons just proximal to the insertion.  No focal pain behind the lateral malleolus.  No pain over the posterior tibialis tendon     ASSESSMENT  1. Right foot pain   2.  Peroneal tendinitis, right     PLAN:  Pertinent additional documentation may be included in corresponding procedure notes, imaging studies, problem based documentation and patient instructions.  Procedures:  None  Medications:  No orders of the defined types were placed in this encounter. Discussion/Instructions: No problem-specific Assessment & Plan notes found for this encounter. Overall he is doing well and we will plan to have him continue with nitroglycerin protocol, body helix and home therapeutic exercises as previously prescribed.  Discussed an additional 6 to 12 weeks of treatment and we will plan to repeat ultrasound at follow-up as long as the symptoms are almost completely resolved. Continue previously prescribed home exercise program.  Discussed red flag symptoms that warrant earlier emergent evaluation and patient voices understanding. Activity modifications and the importance of avoiding exacerbating activities (limiting pain to no more than a 4 / 10 during or following activity) recommended and discussed.  Follow-up:  Return in about 6 weeks (around 08/10/2018) for repeat diagnostic ultrasound.  If any lack of improvement: consider further diagnostic evaluation with MRI of the ankle if needed and/or repeat MSK [place]          Mackenzi Krogh D Adarius Tigges, [place] [facility] Physician

## 2018-08-10 ENCOUNTER — Other Ambulatory Visit: Payer: Self-pay | Admitting: Family Medicine

## 2018-08-11 ENCOUNTER — Ambulatory Visit: Payer: Medicare Other | Admitting: Sports Medicine

## 2018-08-19 ENCOUNTER — Ambulatory Visit (INDEPENDENT_AMBULATORY_CARE_PROVIDER_SITE_OTHER): Payer: Medicare Other | Admitting: Sports Medicine

## 2018-08-19 ENCOUNTER — Encounter: Payer: Self-pay | Admitting: Sports Medicine

## 2018-08-19 ENCOUNTER — Ambulatory Visit: Payer: Self-pay

## 2018-08-19 VITALS — BP 138/66 | HR 58 | Ht 71.0 in | Wt 225.6 lb

## 2018-08-19 DIAGNOSIS — M7671 Peroneal tendinitis, right leg: Secondary | ICD-10-CM | POA: Diagnosis not present

## 2018-08-19 DIAGNOSIS — M79671 Pain in right foot: Secondary | ICD-10-CM

## 2018-08-19 NOTE — Progress Notes (Signed)
Juanda Bond. [place], [place] at [place] - 68 y.o. male MRN [id number]  Date of birth: 09/25/1950  Visit Date: August 19, 2018  PCP: Marin Olp, MD   Referred by: Marin Olp, MD  SUBJECTIVE:  Chief Complaint  Patient presents with  . Right Foot - Follow-up    Following Nitro Protocol, Tylenol or IBU prn. Wearing Body Helix compression sleeve. Provided with HEP - 4 way ANKLE exercises.   . Follow-up    R foot pain    HPI: Patient presents with the above complaints. He reports overall a moderate improvement in his symptoms that seem to be less severe and take a longer term to evolve however he does have persistent pain and discomfort especially towards the end of the day.  Nitroglycerin patches been tolerating well with no headaches.  REVIEW OF SYSTEMS: Denies fevers, chills, recent weight gain or weight loss.  No night sweats. No significant nighttime awakenings due to this issue. Pt denies any change in bowel or bladder habits, muscle weakness, numbness or falls associated with this pain.  HISTORY:  Prior history reviewed and updated per electronic medical record.  Social History   Occupational History  . Not on file  Tobacco Use  . Smoking status: Former Smoker    Packs/day: 2.00    Years: 25.00    Pack years: 50.00    Types: Cigarettes    Last attempt to quit: 07/30/1987    Years since quitting: 31.0  . Smokeless tobacco: Never Used  Substance and Sexual Activity  . Alcohol use: No    Alcohol/week: 0.0 standard drinks  . Drug use: No  . Sexual activity: Not on file   Social History   Social History Narrative   Family: Divorced. 2 daughters. 3 granddaughters. Doesn't get to see them.       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  No acute distress.  Alert and appropriate.  Right posterior ankle is tender to palpation. Pain directly over the tendinous portion of the peroneal tendons at the base of the fifth metatarsal.  No focal pain with movement of the fifth metatarsal.  ASSESSMENT  1. Right foot pain   2. Peroneal tendinitis, right     PROCEDURES:  US Guided Injection per procedure note      PLAN:  Pertinent additional documentation may be included in corresponding procedure notes, imaging studies, problem based documentation and patient instructions.  Given the persistent fairly significant pain ultrasound-guided tendon sheath injection performed today  Activity modifications and the importance of avoiding exacerbating activities (limiting pain to no more than a 4 / 10 during or following activity) recommended and discussed. Discussed red flag symptoms that warrant earlier emergent evaluation and patient voices understanding.   No orders of the defined types were placed in this encounter.  Lab Orders  No laboratory test(s) ordered today    Imaging Orders     [place] MSK POCT ULTRASOUND Referral Orders  No referral(s) requested today   Will plan to consider repeat MSK ultrasound follow-up.   Return in about 6 weeks (around 09/30/2018).  Gerda Diss, [facility] Physician

## 2018-08-19 NOTE — Procedures (Signed)
PROCEDURE NOTE:  Ultrasound Guided: Injection: Right peroneal tendon insertion and Tendon Sheath Images were obtained and interpreted by myself, Teresa Coombs, DO  Images have been saved and stored to PACS system. Images obtained on: GE S7 Ultrasound machine    ULTRASOUND FINDINGS:  Tenosynovitis of the peroneal tendons with thickening at the lateral malleolus  DESCRIPTION OF PROCEDURE:  The patient's clinical condition is marked by substantial pain and/or significant functional disability. Other conservative therapy has not provided relief, is contraindicated, or not appropriate. There is a reasonable likelihood that injection will significantly improve the patient's pain and/or functional impairment.   After discussing the risks, benefits and expected outcomes of the injection and all questions were reviewed and answered, the patient wished to undergo the above named procedure.  Verbal consent was obtained.  The ultrasound was used to identify the target structure and adjacent neurovascular structures. The skin was then prepped in sterile fashion and the target structure was injected under direct visualization using sterile technique as below:  Single injection performed as below: PREP: Alcohol and Ethel Chloride APPROACH:direct, single injection, 25g 1.5 in. INJECTATE: 1 cc 0.5% Marcaine and 1 cc 40mg /mL DepoMedrol ASPIRATE: None DRESSING: Band-Aid  Post procedural instructions including recommending icing and warning signs for infection were reviewed.    This procedure was well tolerated and there were no complications.   IMPRESSION: Succesful Ultrasound Guided: Injection

## 2018-08-19 NOTE — Patient Instructions (Signed)

## 2018-08-20 ENCOUNTER — Telehealth: Payer: Self-pay | Admitting: Family Medicine

## 2018-08-20 DIAGNOSIS — K219 Gastro-esophageal reflux disease without esophagitis: Secondary | ICD-10-CM

## 2018-08-20 MED ORDER — OMEPRAZOLE 40 MG PO CPDR: 40.0000 mg | DELAYED_RELEASE_CAPSULE | Freq: Every day | ORAL | 3 refills | Status: DC

## 2018-08-20 NOTE — Telephone Encounter (Signed)
Trial omeprazole 40mg  instead of nexium 40mg  due to formulary restrictions

## 2018-08-20 NOTE — Assessment & Plan Note (Signed)
Trial omeprazole 40mg  instead of nexium 40mg  due to formulary restrictions

## 2018-08-22 ENCOUNTER — Encounter: Payer: Self-pay | Admitting: Sports Medicine

## 2018-08-24 DIAGNOSIS — I10 Essential (primary) hypertension: Secondary | ICD-10-CM | POA: Diagnosis not present

## 2018-08-24 DIAGNOSIS — N289 Disorder of kidney and ureter, unspecified: Secondary | ICD-10-CM | POA: Diagnosis not present

## 2018-08-24 DIAGNOSIS — E785 Hyperlipidemia, unspecified: Secondary | ICD-10-CM | POA: Diagnosis not present

## 2018-08-24 DIAGNOSIS — Z125 Encounter for screening for malignant neoplasm of prostate: Secondary | ICD-10-CM | POA: Diagnosis not present

## 2018-08-24 DIAGNOSIS — I2510 Atherosclerotic heart disease of native coronary artery without angina pectoris: Secondary | ICD-10-CM | POA: Diagnosis not present

## 2018-08-24 LAB — BASIC METABOLIC PANEL
BUN: 21 (ref 4–21)
Creatinine: 1.4 — AB (ref 0.6–1.3)
Glucose: 94
Potassium: 4.5 (ref 3.4–5.3)
Sodium: 140 (ref 137–147)

## 2018-08-24 LAB — CBC AND DIFFERENTIAL
HCT: 42 (ref 41–53)
Hemoglobin: 14 (ref 13.5–17.5)
Platelets: 134 — AB (ref 150–399)
WBC: 8

## 2018-08-24 LAB — LIPID PANEL
Cholesterol: 134 (ref 0–200)
HDL: 49 (ref 35–70)
LDL Cholesterol: 51
Triglycerides: 170 — AB (ref 40–160)

## 2018-08-24 LAB — HEPATIC FUNCTION PANEL
ALT: 23 (ref 10–40)
AST: 17 (ref 14–40)
Alkaline Phosphatase: 62 (ref 25–125)
Bilirubin, Total: 0.5

## 2018-09-30 ENCOUNTER — Ambulatory Visit: Payer: Medicare Other | Admitting: Sports Medicine

## 2018-11-10 ENCOUNTER — Ambulatory Visit (INDEPENDENT_AMBULATORY_CARE_PROVIDER_SITE_OTHER): Payer: Medicare Other | Admitting: Family Medicine

## 2018-11-10 ENCOUNTER — Encounter: Payer: Self-pay | Admitting: Family Medicine

## 2018-11-10 VITALS — BP 135/70 | Ht 71.0 in | Wt 214.0 lb

## 2018-11-10 DIAGNOSIS — M19041 Primary osteoarthritis, right hand: Secondary | ICD-10-CM

## 2018-11-10 DIAGNOSIS — I2510 Atherosclerotic heart disease of native coronary artery without angina pectoris: Secondary | ICD-10-CM

## 2018-11-10 DIAGNOSIS — M19042 Primary osteoarthritis, left hand: Secondary | ICD-10-CM

## 2018-11-10 DIAGNOSIS — E785 Hyperlipidemia, unspecified: Secondary | ICD-10-CM | POA: Diagnosis not present

## 2018-11-10 DIAGNOSIS — I10 Essential (primary) hypertension: Secondary | ICD-10-CM

## 2018-11-10 MED ORDER — FAMOTIDINE 20 MG PO TABS: 20.0000 mg | ORAL_TABLET | Freq: Two times a day (BID) | ORAL | 3 refills | Status: DC

## 2018-11-10 MED ORDER — LOSARTAN POTASSIUM 100 MG PO TABS: 100.0000 mg | ORAL_TABLET | Freq: Every day | ORAL | 3 refills | Status: DC

## 2018-11-10 MED ORDER — MELOXICAM 15 MG PO TABS: 15.0000 mg | ORAL_TABLET | Freq: Every day | ORAL | 3 refills | Status: DC

## 2018-11-10 MED ORDER — SIMVASTATIN 80 MG PO TABS: 80.0000 mg | ORAL_TABLET | Freq: Every day | ORAL | 1 refills | Status: DC

## 2018-11-10 MED ORDER — FLUTICASONE PROPIONATE 50 MCG/ACT NA SUSP: 2.0000 | Freq: Every day | NASAL | 3 refills | Status: DC

## 2018-11-10 NOTE — Progress Notes (Signed)
Phone [phone]   Subjective:  Virtual visit via Video note. Chief complaint: Chief Complaint  Patient presents with  . Medication Refill    Nitroglycerin, Meloxicam, Flonase.    This visit type was conducted due to national recommendations for restrictions regarding the COVID-19 Pandemic (e.g. social distancing).  This format is felt to be most appropriate for this patient at this time balancing risks to patient and risks to population by having him in for in person visit.  No physical exam was performed (except for noted visual exam or audio findings with Telehealth visits).    Our team/I connected with William Park on 11/10/18 at  9:40 AM EDT by a video enabled telemedicine application ([email or web address]) and verified that I am speaking with the correct person using two identifiers.  Location patient: [facility] Location provider: [facility], office Persons participating in the virtual visit:  patient  Our team/I discussed the limitations of evaluation and management by telemedicine and the availability of in person appointments. In light of current covid-19 pandemic, patient also understands that we are trying to protect them by minimizing in office contact if at all possible.  The patient expressed consent for telemedicine visit and agreed to proceed. Patient understands insurance will be billed.   ROS- No chest pain or shortness of breath. No headache or blurry vision.     Past Medical History-  Patient Active Problem List   Diagnosis Date Noted  . CAD (coronary artery disease)     Priority: High  . Major depressive disorder with single episode, in full remission ([place]) 05/12/2018    Priority: Medium  . Hyperlipidemia     Priority: Medium  . Hypertension     Priority: Medium  . GERD (gastroesophageal reflux disease)     Priority: Medium  . Osteoarthritis     Priority: Medium  . Former smoker 01/06/2015    Priority: Low  . Allergic rhinitis     Priority: Low  . Peroneal  tendinitis, right 05/18/2018  . Anemia 08/13/2016    Medications- reviewed and updated Current Outpatient Medications  Medication Sig Dispense Refill  . aspirin 81 MG tablet Take 81 mg by mouth daily.    Marland Kitchen esomeprazole (NEXIUM) 40 MG capsule TAKE (1) CAPSULE BY MOUTH ONCE DAILY AT 12 NOON. 30 capsule 0  . famotidine (PEPCID) 20 MG tablet Take 1 tablet (20 mg total) by mouth 2 (two) times daily. 180 tablet 3  . fluticasone (FLONASE) 50 MCG/ACT nasal spray Place 2 sprays into both nostrils daily. 48 g 3  . losartan (COZAAR) 100 MG tablet Take 1 tablet (100 mg total) by mouth daily. 90 tablet 3  . meloxicam (MOBIC) 15 MG tablet Take 1 tablet (15 mg total) by mouth daily. 90 tablet 3  . simvastatin (ZOCOR) 80 MG tablet Take 1 tablet (80 mg total) by mouth daily. 90 tablet 1   No current facility-administered medications for this visit.      Objective:  BP 135/70 (BP Location: Left Arm, Patient Position: Sitting, Cuff Size: Normal)   Ht 5\' 11"  (1.803 m)   Wt 214 lb (97.1 kg)   BMI 29.85 kg/m  Gen: NAD, resting comfortably Lungs: nonlabored, normal respiratory rate  Skin: appears dry, no obvious rash Normal speech and upper extremity movement    Assessment and Plan   #hypertension S: controlled on losartan 100mg  BP Readings from Last 3 Encounters:  11/10/18 135/70  08/19/18 138/66  06/29/18 124/78  A/P: Stable. Continue current medications.   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   # GERD S:controlled on Mainly pepcid- nexium if needed A/P: glad  he is doing ok on current regimen- with sparing PPI dont feel strongly about checking b12 level  # OA hands S:taking 2 to 3 meloxicam per week down from daily- [name] heart doctor asked him to cut back and he has complied A/P: reasonable control of OA despite cutting back. Will get bmp from cardiology which sounds like done December/january  - cardiologist seems to be ok from CAD perspective with more sparing meloxicam- hopefully can contineu to try to cut back  # Depression S: remains in full remission with phq9 under 5 Depression screen [facility] 2/9 11/10/2018  Decreased Interest 0  Down, Depressed, Hopeless 1  PHQ - 2 Score 1  Altered sleeping 0  Tired, decreased energy 1  Change in appetite 0  Feeling bad or failure about yourself  1  Trouble concentrating 0  Moving slowly or fidgety/restless 0  Suicidal thoughts 0  PHQ-9 Score 3  Difficult doing work/chores Not difficult at all  A/P: Stable. Continue without rx and try to monitor every 6 months.   Other notes: 1.Ankle doing better- he states Dr. Paulla Fore healed it up 2. Thankfully in overweight category now! With some weight loss. Walking 2 miles a day. GF and he have had issues and appetite went- he states he thinks he may regain but I discouraged this. Encouraged need for healthy eating, regular exercise, weight loss.   Hoping for 6 month in person follow up and labwork  Lab/Order associations: Essential hypertension  Hyperlipidemia, unspecified hyperlipidemia type  Osteoarthritis of both hands, unspecified osteoarthritis type  Coronary artery disease involving native coronary artery of native heart without angina pectoris  Meds ordered this encounter  Medications  . simvastatin (ZOCOR) 80 MG tablet    Sig: Take 1 tablet (80 mg total) by mouth daily.    Dispense:  90 tablet    Refill:  1  . losartan (COZAAR) 100 MG tablet    Sig: Take 1 tablet (100 mg total) by mouth daily.    Dispense:  90 tablet    Refill:  3  .  meloxicam (MOBIC) 15 MG tablet    Sig: Take 1 tablet (15 mg total) by mouth daily.    Dispense:  90 tablet    Refill:  3  . fluticasone (FLONASE) 50 MCG/ACT nasal spray    Sig: Place 2 sprays into both nostrils daily.    Dispense:  48 g    Refill:  3  . famotidine (PEPCID) 20 MG tablet    Sig: Take 1 tablet (20 mg total) by mouth 2 (two) times daily.    Dispense:  180 tablet    Refill:  3   Return precautions advised.  Garret Reddish, MD

## 2018-11-10 NOTE — Patient Instructions (Addendum)
Video visit

## 2018-11-10 NOTE — Progress Notes (Signed)
Called but its going to vm w/o ringing. No sure if they are really busy or closed. Will try again tomorrow. I did leave a vm on main answering machine.

## 2018-11-18 NOTE — Progress Notes (Signed)
Spoke to [place] from Dr. Nolberto Hanlon office. Labs are currently being sent via fax

## 2018-12-01 ENCOUNTER — Encounter: Payer: Self-pay | Admitting: Family Medicine

## 2019-02-09 DIAGNOSIS — I2510 Atherosclerotic heart disease of native coronary artery without angina pectoris: Secondary | ICD-10-CM | POA: Diagnosis not present

## 2019-02-09 DIAGNOSIS — R079 Chest pain, unspecified: Secondary | ICD-10-CM | POA: Diagnosis not present

## 2019-02-10 DIAGNOSIS — R079 Chest pain, unspecified: Secondary | ICD-10-CM | POA: Diagnosis not present

## 2019-02-10 DIAGNOSIS — I25118 Atherosclerotic heart disease of native coronary artery with other forms of angina pectoris: Secondary | ICD-10-CM | POA: Diagnosis not present

## 2019-02-11 DIAGNOSIS — R0602 Shortness of breath: Secondary | ICD-10-CM | POA: Diagnosis not present

## 2019-02-11 DIAGNOSIS — R079 Chest pain, unspecified: Secondary | ICD-10-CM | POA: Diagnosis not present

## 2019-02-22 DIAGNOSIS — E785 Hyperlipidemia, unspecified: Secondary | ICD-10-CM | POA: Diagnosis not present

## 2019-02-22 DIAGNOSIS — I2510 Atherosclerotic heart disease of native coronary artery without angina pectoris: Secondary | ICD-10-CM | POA: Diagnosis not present

## 2019-02-22 DIAGNOSIS — I10 Essential (primary) hypertension: Secondary | ICD-10-CM | POA: Diagnosis not present

## 2019-02-22 DIAGNOSIS — Z125 Encounter for screening for malignant neoplasm of prostate: Secondary | ICD-10-CM | POA: Diagnosis not present

## 2019-02-24 ENCOUNTER — Telehealth: Payer: Self-pay | Admitting: Family Medicine

## 2019-02-24 NOTE — Telephone Encounter (Signed)
I left a message asking the patient to call me at [phone] to schedule AWV with Loma Sousa. Last AWV 12/09/17 so patient can be scheduled at next available opening. VDM (Dee-Dee)

## 2019-02-26 ENCOUNTER — Other Ambulatory Visit: Payer: Self-pay

## 2019-03-02 DIAGNOSIS — K219 Gastro-esophageal reflux disease without esophagitis: Secondary | ICD-10-CM | POA: Diagnosis not present

## 2019-03-02 DIAGNOSIS — R1310 Dysphagia, unspecified: Secondary | ICD-10-CM | POA: Diagnosis not present

## 2019-03-02 DIAGNOSIS — J310 Chronic rhinitis: Secondary | ICD-10-CM | POA: Diagnosis not present

## 2019-03-09 ENCOUNTER — Telehealth: Payer: Self-pay

## 2019-03-09 DIAGNOSIS — R1310 Dysphagia, unspecified: Secondary | ICD-10-CM

## 2019-03-09 NOTE — Telephone Encounter (Signed)
OK to place referral or does pt need OV to discuss sx first?

## 2019-03-09 NOTE — Telephone Encounter (Signed)
Copied from [place]. Topic: Referral - Request for Referral >> Mar 09, 2019 11:46 AM Alanda Slim E wrote: Has patient seen PCP for this complaint? Yes  *If NO, is insurance requiring patient see PCP for this issue before PCP can refer them? Referral for which specialty: Gastroenterology  Preferred provider/office:  Reason for referral: swallowing issues

## 2019-03-09 NOTE — Telephone Encounter (Signed)
Called pt and left VM to call the office.  

## 2019-03-09 NOTE — Telephone Encounter (Signed)
He was followed with GI in [place] in the past-is that where he is requesting referral back to?  He is on high dose of medication for GERD- referral seems appropriate with me to provider of his preference- please make sure he is not having significant trouble swallowing that would require sooner attention and office visit- I am open to seeing him to discuss if he prefers

## 2019-03-09 NOTE — Addendum Note (Signed)
Addended by: Jasper Loser on: 03/09/2019 04:24 PM   Modules accepted: Orders

## 2019-03-09 NOTE — Telephone Encounter (Signed)
Please call pt back at convenience [phone]

## 2019-03-09 NOTE — Telephone Encounter (Signed)
Called and spoke with patient, he would prefer to be referred to [facility]. He will call the office if sx worsen and he feels like he needs to be seen sooner.   Referral has been placed.

## 2019-03-10 ENCOUNTER — Encounter: Payer: Self-pay | Admitting: Gastroenterology

## 2019-03-15 ENCOUNTER — Other Ambulatory Visit: Payer: Self-pay

## 2019-03-15 ENCOUNTER — Ambulatory Visit (INDEPENDENT_AMBULATORY_CARE_PROVIDER_SITE_OTHER): Payer: Medicare Other

## 2019-03-15 DIAGNOSIS — Z0000 Encounter for general adult medical examination without abnormal findings: Secondary | ICD-10-CM

## 2019-03-15 NOTE — Patient Instructions (Signed)
[name] , Thank you for taking time to come for your Medicare Wellness Visit. I appreciate your ongoing commitment to your health goals. Please review the following plan we discussed and let me know if I can assist you in the future.   Screening recommendations/referrals: Colorectal Screening: up to date; Cologuard repeat 02/2020  Vision and Dental Exams: Recommended annual ophthalmology exams for early detection of glaucoma and other disorders of the eye Recommended annual dental exams for proper oral hygiene  Vaccinations: Influenza vaccine:  recommended this fall either at PCP office or through your local pharmacy  Pneumococcal vaccine: up to date; last 07/21/15 Tdap vaccine: up to date; last 01/26/16 Shingles vaccine: Please call your insurance company to determine your out of pocket expense for the Shingrix vaccine. You may receive this vaccine at your local pharmacy.  Advanced directives: Please bring a copy of your POA (Power of Attorney) and/or Living Will to your next appointment.  Goals: Recommend to drink at least 6-8 8oz glasses of water per day.  Next appointment: Please schedule your Annual Wellness Visit with your Nurse Health Advisor in one year.  Preventive Care 3 Years and Older, Male Preventive care refers to lifestyle choices and visits with your health care provider that can promote health and wellness. What does preventive care include?  A yearly physical exam. This is also called an annual well check.  Dental exams once or twice a year.  Routine eye exams. Ask your health care provider how often you should have your eyes checked.  Personal lifestyle choices, including:  Daily care of your teeth and gums.  Regular physical activity.  Eating a healthy diet.  Avoiding tobacco and drug use.  Limiting alcohol use.  Practicing safe sex.  Taking low doses of aspirin every day if recommended by your health care provider..  Taking vitamin and  mineral supplements as recommended by your health care provider. What happens during an annual well check? The services and screenings done by your health care provider during your annual well check will depend on your age, overall health, lifestyle risk factors, and family history of disease. Counseling  Your health care provider may ask you questions about your:  Alcohol use.  Tobacco use.  Drug use.  Emotional well-being.  Home and relationship well-being.  Sexual activity.  Eating habits.  History of falls.  Memory and ability to understand (cognition).  Work and work Statistician. Screening  You may have the following tests or measurements:  Height, weight, and BMI.  Blood pressure.  Lipid and cholesterol levels. These may be checked every 5 years, or more frequently if you are over 65 years old.  Skin check.  Lung cancer screening. You may have this screening every year starting at age 53 if you have a 30-pack-year history of smoking and currently smoke or have quit within the past 15 years.  Fecal occult blood test (FOBT) of the stool. You may have this test every year starting at age 34.  Flexible sigmoidoscopy or colonoscopy. You may have a sigmoidoscopy every 5 years or a colonoscopy every 10 years starting at age 67.  Prostate cancer screening. Recommendations will vary depending on your family history and other risks.  Hepatitis C blood test.  Hepatitis B blood test.  Sexually transmitted disease (STD) testing.  Diabetes screening. This is done by checking your blood sugar (glucose) after you have not eaten for a while (fasting). You may have this done every 1-3 years.  Abdominal aortic  aneurysm (AAA) screening. You may need this if you are a current or former smoker.  Osteoporosis. You may be screened starting at age 37 if you are at high risk. Talk with your health care provider about your test results, treatment options, and if necessary, the need  for more tests. Vaccines  Your health care provider may recommend certain vaccines, such as:  Influenza vaccine. This is recommended every year.  Tetanus, diphtheria, and acellular pertussis (Tdap, Td) vaccine. You may need a Td booster every 10 years.  Zoster vaccine. You may need this after age 53.  Pneumococcal 13-valent conjugate (PCV13) vaccine. One dose is recommended after age 47.  Pneumococcal polysaccharide (PPSV23) vaccine. One dose is recommended after age 39. Talk to your health care provider about which screenings and vaccines you need and how often you need them. This information is not intended to replace advice given to you by your health care provider. Make sure you discuss any questions you have with your health care provider. Document Released: 08/11/2015 Document Revised: 04/03/2016 Document Reviewed: 05/16/2015 Elsevier Interactive Patient Education  2017 [employer] Prevention in the Home Falls can cause injuries. They can happen to people of all ages. There are many things you can do to make your home safe and to help prevent falls. What can I do on the outside of my home?  Regularly fix the edges of walkways and driveways and fix any cracks.  Remove anything that might make you trip as you walk through a door, such as a raised step or threshold.  Trim any bushes or trees on the path to your home.  Use bright outdoor lighting.  Clear any walking paths of anything that might make someone trip, such as rocks or tools.  Regularly check to see if handrails are loose or broken. Make sure that both sides of any steps have handrails.  Any raised decks and porches should have guardrails on the edges.  Have any leaves, snow, or ice cleared regularly.  Use sand or salt on walking paths during winter.  Clean up any spills in your garage right away. This includes oil or grease spills. What can I do in the bathroom?  Use night lights.  Install grab bars  by the toilet and in the tub and shower. Do not use towel bars as grab bars.  Use non-skid mats or decals in the tub or shower.  If you need to sit down in the shower, use a plastic, non-slip stool.  Keep the floor dry. Clean up any water that spills on the floor as soon as it happens.  Remove soap buildup in the tub or shower regularly.  Attach bath mats securely with double-sided non-slip rug tape.  Do not have throw rugs and other things on the floor that can make you trip. What can I do in the bedroom?  Use night lights.  Make sure that you have a light by your bed that is easy to reach.  Do not use any sheets or blankets that are too big for your bed. They should not hang down onto the floor.  Have a firm chair that has side arms. You can use this for support while you get dressed.  Do not have throw rugs and other things on the floor that can make you trip. What can I do in the kitchen?  Clean up any spills right away.  Avoid walking on wet floors.  Keep items that you use a lot  in easy-to-reach places.  If you need to reach something above you, use a strong step stool that has a grab bar.  Keep electrical cords out of the way.  Do not use floor polish or wax that makes floors slippery. If you must use wax, use non-skid floor wax.  Do not have throw rugs and other things on the floor that can make you trip. What can I do with my stairs?  Do not leave any items on the stairs.  Make sure that there are handrails on both sides of the stairs and use them. Fix handrails that are broken or loose. Make sure that handrails are as long as the stairways.  Check any carpeting to make sure that it is firmly attached to the stairs. Fix any carpet that is loose or worn.  Avoid having throw rugs at the top or bottom of the stairs. If you do have throw rugs, attach them to the floor with carpet tape.  Make sure that you have a light switch at the top of the stairs and the  bottom of the stairs. If you do not have them, ask someone to add them for you. What else can I do to help prevent falls?  Wear shoes that:  Do not have high heels.  Have rubber bottoms.  Are comfortable and fit you well.  Are closed at the toe. Do not wear sandals.  If you use a stepladder:  Make sure that it is fully opened. Do not climb a closed stepladder.  Make sure that both sides of the stepladder are locked into place.  Ask someone to hold it for you, if possible.  Clearly mark and make sure that you can see:  Any grab bars or handrails.  First and last steps.  Where the edge of each step is.  Use tools that help you move around (mobility aids) if they are needed. These include:  Canes.  Walkers.  Scooters.  Crutches.  Turn on the lights when you go into a dark area. Replace any light bulbs as soon as they burn out.  Set up your furniture so you have a clear path. Avoid moving your furniture around.  If any of your floors are uneven, fix them.  If there are any pets around you, be aware of where they are.  Review your medicines with your doctor. Some medicines can make you feel dizzy. This can increase your chance of falling. Ask your doctor what other things that you can do to help prevent falls. This information is not intended to replace advice given to you by your health care provider. Make sure you discuss any questions you have with your health care provider. Document Released: 05/11/2009 Document Revised: 12/21/2015 Document Reviewed: 08/19/2014 Elsevier Interactive Patient Education  2017 [employer].

## 2019-03-15 NOTE — Progress Notes (Signed)
I have reviewed and agree with note, evaluation, plan.   Kyjuan Gause, MD  

## 2019-03-15 NOTE — Progress Notes (Signed)
This visit is being conducted via phone call due to the COVID-19 pandemic. This patient has given me verbal consent via phone to conduct this visit, patient states they are participating from their home address. Some vital signs may be absent or patient reported.   Patient identification: identified by name, DOB, and current address.   Subjective:   William Park is a 68 y.o. male who presents for Medicare Annual/Subsequent preventive examination.  Review of Systems:   Cardiac Risk Factors include: advanced age (>69men, >39 women);dyslipidemia;hypertension     Objective:    Vitals: There were no vitals taken for this visit.  There is no height or weight on file to calculate BMI.  Advanced Directives 03/15/2019 12/09/2017  Does Patient Have a Medical Advance Directive? Yes Yes  Type of Paramedic of [place];Living will [place];Living will  Does patient want to make changes to medical advance directive? No - Patient declined No - Patient declined  Copy of [place] in Chart? No - copy requested No - copy requested    Tobacco Social History   Tobacco Use  Smoking Status Former Smoker  . Packs/day: 2.00  . Years: 25.00  . Pack years: 50.00  . Types: Cigarettes  . Quit date: 07/30/1987  . Years since quitting: 31.6  Smokeless Tobacco Never Used     Counseling given: Not Answered   Clinical Intake:  Pre-visit preparation completed: Yes  Pain : No/denies pain  Diabetes: No  How often do you need to have someone help you when you read instructions, pamphlets, or other written materials from your doctor or pharmacy?: 1 - Never  Interpreter Needed?: No  Information entered by :: Denman George LPN  Past Medical History:  Diagnosis Date  . Allergic rhinitis    allegra and flonase  . CAD (coronary artery disease)    Dr. [name] s/p 4 angioplasty's with last in 86. ASA. simvastatin 80mg   . GERD  (gastroesophageal reflux disease)    nexium 40mg  and zantac BID-mout fills with saliva and coughing. Seeing GI in [facility] 12/2014  . Hyperlipidemia    simvastatin 80mg , niaspan 750mg   . Hypertension    losartan 50mg   . Osteoarthritis    mainly hands. Mobic 15mg    Past Surgical History:  Procedure Laterality Date  . ANGIOPLASTY     x4  . pyloric stenosis     as a child   Family History  Problem Relation Age of Onset  . Dementia Father        passed age 48  . Kidney disease Father        CKD  . CAD Father   . AAA (abdominal aortic aneurysm) Father   . Breast cancer Mother   . Congestive Heart Failure Mother        75 time of death  . CAD Brother    Social History   Socioeconomic History  . Marital status: Divorced    Spouse name: Not on file  . Number of children: Not on file  . Years of education: Not on file  . Highest education level: Not on file  Occupational History  . Not on file  Social Needs  . Financial resource strain: Not on file  . Food insecurity    Worry: Not on file    Inability: Not on file  . Transportation needs    Medical: Not on file    Non-medical: Not on file  Tobacco  Use  . Smoking status: Former Smoker    Packs/day: 2.00    Years: 25.00    Pack years: 50.00    Types: Cigarettes    Quit date: 07/30/1987    Years since quitting: 31.6  . Smokeless tobacco: Never Used  Substance and Sexual Activity  . Alcohol use: No    Alcohol/week: 0.0 standard drinks  . Drug use: No  . Sexual activity: Not on file  Lifestyle  . Physical activity    Days per week: Not on file    Minutes per session: Not on file  . Stress: Not on file  Relationships  . Social Herbalist on phone: Not on file    Gets together: Not on file    Attends religious service: Not on file    Active member of club or organization: Not on file    Attends meetings of clubs or organizations: Not on file    Relationship status: Not on file  Other Topics Concern  .  Not on file  Social History Narrative   Family: Divorced. 2 daughters. 3 granddaughters. Doesn't get to see them.       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 omeprazole (PRILOSEC) 20 MG capsule Take by mouth.  Marland Kitchen aspirin 81 MG tablet Take 81 mg by mouth daily.  . fluticasone (FLONASE) 50 MCG/ACT nasal spray Place 2 sprays into both nostrils daily.  Marland Kitchen losartan (COZAAR) 100 MG tablet Take 1 tablet (100 mg total) by mouth daily.  . meloxicam (MOBIC) 15 MG tablet Take 1 tablet (15 mg total) by mouth daily.  . simvastatin (ZOCOR) 80 MG tablet Take 1 tablet (80 mg total) by mouth daily.  . [DISCONTINUED] esomeprazole (NEXIUM) 40 MG capsule TAKE (1) CAPSULE BY MOUTH ONCE DAILY AT 12 NOON.  . [DISCONTINUED] famotidine (PEPCID) 20 MG tablet Take 1 tablet (20 mg total) by mouth 2 (two) times daily.   No facility-administered encounter medications on file as of 03/15/2019.     Activities of Daily Living In your present state of health, do you have any difficulty performing the following activities: 03/15/2019  Hearing? N  Vision? N  Difficulty concentrating or making decisions? N  Walking or climbing stairs? N  Dressing or bathing? N  Doing errands, shopping? N  Preparing Food and eating ? N  Using the Toilet? N  In the past six months, have you accidently leaked urine? N  Do you have problems with loss of bowel control? N  Managing your Medications? N  Managing your Finances? N  Housekeeping or managing your Housekeeping? N  Some recent data might be hidden    Patient Care Team: Marin Olp, MD as PCP - General (Family Medicine)   Assessment:   This is a routine wellness examination for William Park.  Exercise Activities and Dietary recommendations Current Exercise Habits: The patient has a physically strenuous job, but has no regular exercise apart from work.   Goals    . Patient Stated (pt-stated)     Maintain current state of health and functional ability        Fall Risk Fall Risk  03/15/2019 12/09/2017 11/10/2017 05/23/2016  Falls in the past year? 0 No No No  Number falls in past yr: 0 - - -  Injury with Fall? 0 - - -  Follow up Education provided - - -   IsDepression Screen PHQ 2/9 Scores 03/15/2019 11/10/2018 12/09/2017 11/10/2017  PHQ - 2 Score 0 1 0 0  PHQ- 9 Score - 3 0 -    Cognitive Function    No cognitive concerns at this time     Immunization History  Administered Date(s) Administered  . Influenza, High Dose Seasonal PF 04/07/2017, 05/12/2018  . Influenza,inj,Quad PF,6+ Mos 07/21/2015  . Influenza-Unspecified 07/17/2016  . Pneumococcal Conjugate-13 07/21/2015  . Pneumococcal Polysaccharide-23 03/10/2017  . Tdap 01/26/2016    Qualifies for Shingles Vaccine? Discussed and patient will check with pharmacy for coverage.  Patient education handout provided    Screening Tests Health Maintenance  Topic Date Due  . INFLUENZA VACCINE  02/27/2019  . Hepatitis C Screening  07/25/2098 (Originally [birth year])  . Fecal DNA (Cologuard)  03/16/2020  . TETANUS/TDAP  01/25/2026  . PNA vac Low Risk Adult  Completed   Cancer Screenings: Colorectal: Cologuard 02/2017       Plan:  I have personally reviewed and addressed the Medicare Annual Wellness questionnaire and have noted the following in the patient's chart:  A. Medical and social history B. Use of alcohol, tobacco or illicit drugs  C. Current medications and supplements D. Functional ability and status E.  Nutritional status F.  Physical activity G. Advance directives H. List of other physicians I.  Hospitalizations, surgeries, and ER visits in previous 12 months J.  [place] such as hearing and vision if needed, cognitive and depression L. Referrals, records requested, and appointments-   In addition, I have reviewed and discussed with patient  certain preventive protocols, quality metrics, and best practice recommendations. A written personalized care plan for preventive services as well as general preventive health recommendations were provided to patient.   Signed,  Denman George, LPN  Nurse Health Advisor   Nurse Notes: no additions

## 2019-04-19 ENCOUNTER — Ambulatory Visit: Payer: Medicare Other | Admitting: Gastroenterology

## 2019-05-17 DIAGNOSIS — B351 Tinea unguium: Secondary | ICD-10-CM | POA: Diagnosis not present

## 2019-05-17 DIAGNOSIS — R208 Other disturbances of skin sensation: Secondary | ICD-10-CM | POA: Diagnosis not present

## 2019-05-17 DIAGNOSIS — L814 Other melanin hyperpigmentation: Secondary | ICD-10-CM | POA: Diagnosis not present

## 2019-05-17 DIAGNOSIS — R209 Unspecified disturbances of skin sensation: Secondary | ICD-10-CM | POA: Diagnosis not present

## 2019-05-17 DIAGNOSIS — L298 Other pruritus: Secondary | ICD-10-CM | POA: Diagnosis not present

## 2019-05-17 DIAGNOSIS — L820 Inflamed seborrheic keratosis: Secondary | ICD-10-CM | POA: Diagnosis not present

## 2019-05-17 DIAGNOSIS — L538 Other specified erythematous conditions: Secondary | ICD-10-CM | POA: Diagnosis not present

## 2019-05-17 DIAGNOSIS — R238 Other skin changes: Secondary | ICD-10-CM | POA: Diagnosis not present

## 2019-06-21 DIAGNOSIS — H43813 Vitreous degeneration, bilateral: Secondary | ICD-10-CM | POA: Diagnosis not present

## 2019-06-21 DIAGNOSIS — H2513 Age-related nuclear cataract, bilateral: Secondary | ICD-10-CM | POA: Diagnosis not present

## 2019-06-28 ENCOUNTER — Encounter: Payer: Self-pay | Admitting: Gastroenterology

## 2019-06-28 ENCOUNTER — Ambulatory Visit (INDEPENDENT_AMBULATORY_CARE_PROVIDER_SITE_OTHER): Payer: Medicare Other | Admitting: Gastroenterology

## 2019-06-28 VITALS — BP 120/70 | HR 60 | Temp 97.9°F | Ht 71.0 in | Wt 220.0 lb

## 2019-06-28 DIAGNOSIS — Z1211 Encounter for screening for malignant neoplasm of colon: Secondary | ICD-10-CM | POA: Diagnosis not present

## 2019-06-28 DIAGNOSIS — R1310 Dysphagia, unspecified: Secondary | ICD-10-CM | POA: Diagnosis not present

## 2019-06-28 DIAGNOSIS — K219 Gastro-esophageal reflux disease without esophagitis: Secondary | ICD-10-CM

## 2019-06-28 DIAGNOSIS — Z1159 Encounter for screening for other viral diseases: Secondary | ICD-10-CM | POA: Diagnosis not present

## 2019-06-28 DIAGNOSIS — I2510 Atherosclerotic heart disease of native coronary artery without angina pectoris: Secondary | ICD-10-CM | POA: Diagnosis not present

## 2019-06-28 MED ORDER — SUPREP BOWEL PREP KIT 17.5-3.13-1.6 GM/177ML PO SOLN: 1.0000 | Freq: Once | ORAL | 0 refills | Status: AC

## 2019-06-28 MED ORDER — FAMOTIDINE 20 MG PO TABS: 20.0000 mg | ORAL_TABLET | Freq: Every day | ORAL | 2 refills | Status: DC

## 2019-06-28 MED ORDER — OMEPRAZOLE 40 MG PO CPDR: 40.0000 mg | DELAYED_RELEASE_CAPSULE | Freq: Every day | ORAL | 3 refills | Status: DC

## 2019-06-28 NOTE — Patient Instructions (Signed)
You have been scheduled for an endoscopy and colonoscopy. Please follow the written instructions given to you at your visit today. Please pick up your prep supplies at the pharmacy within the next 1-3 days. If you use inhalers (even only as needed), please bring them with you on the day of your procedure.   We have sent Omeprazole and Pepcid to your pharmacy   Gastroesophageal Reflux Disease, Adult Gastroesophageal reflux (GER) happens when acid from the stomach flows up into the tube that connects the mouth and the stomach (esophagus). Normally, food travels down the esophagus and stays in the stomach to be digested. However, when a person has GER, food and stomach acid sometimes move back up into the esophagus. If this becomes a more serious problem, the person may be diagnosed with a disease called gastroesophageal reflux disease (GERD). GERD occurs when the reflux:  Happens often.  Causes frequent or severe symptoms.  Causes problems such as damage to the esophagus. When stomach acid comes in contact with the esophagus, the acid may cause soreness (inflammation) in the esophagus. Over time, GERD may create small holes (ulcers) in the lining of the esophagus. What are the causes? This condition is caused by a problem with the muscle between the esophagus and the stomach (lower esophageal sphincter, or LES). Normally, the LES muscle closes after food passes through the esophagus to the stomach. When the LES is weakened or abnormal, it does not close properly, and that allows food and stomach acid to go back up into the esophagus. The LES can be weakened by certain dietary substances, medicines, and medical conditions, including:  Tobacco use.  Pregnancy.  Having a hiatal hernia.  Alcohol use.  Certain foods and beverages, such as coffee, chocolate, onions, and peppermint. What increases the risk? You are more likely to develop this condition if you:  Have an increased body weight.   Have a connective tissue disorder.  Use NSAID medicines. What are the signs or symptoms? Symptoms of this condition include:  Heartburn.  Difficult or painful swallowing.  The feeling of having a lump in the throat.  Abitter taste in the mouth.  Bad breath.  Having a large amount of saliva.  Having an upset or bloated stomach.  Belching.  Chest pain. Different conditions can cause chest pain. Make sure you see your health care provider if you experience chest pain.  Shortness of breath or wheezing.  Ongoing (chronic) cough or a night-time cough.  Wearing away of tooth enamel.  Weight loss. How is this diagnosed? Your health care provider will take a medical history and perform a physical exam. To determine if you have mild or severe GERD, your health care provider may also monitor how you respond to treatment. You may also have tests, including:  A test to examine your stomach and esophagus with a small camera (endoscopy).  A test thatmeasures the acidity level in your esophagus.  A test thatmeasures how much pressure is on your esophagus.  A barium swallow or modified barium swallow test to show the shape, size, and functioning of your esophagus. How is this treated? The goal of treatment is to help relieve your symptoms and to prevent complications. Treatment for this condition may vary depending on how severe your symptoms are. Your health care provider may recommend:  Changes to your diet.  Medicine.  Surgery. Follow these instructions at home: Eating and drinking   Follow a diet as recommended by your health care provider. This may involve  avoiding foods and drinks such as: ? Coffee and tea (with or without caffeine). ? Drinks that containalcohol. ? Energy drinks and sports drinks. ? Carbonated drinks or sodas. ? Chocolate and cocoa. ? Peppermint and mint flavorings. ? Garlic and onions. ? Horseradish. ? Spicy and acidic foods, including  peppers, chili powder, curry powder, vinegar, hot sauces, and barbecue sauce. ? Citrus fruit juices and citrus fruits, such as oranges, lemons, and limes. ? Tomato-based foods, such as red sauce, chili, salsa, and pizza with red sauce. ? Fried and fatty foods, such as donuts, french fries, potato chips, and high-fat dressings. ? High-fat meats, such as hot dogs and fatty cuts of red and white meats, such as rib eye steak, sausage, ham, and bacon. ? High-fat dairy items, such as whole milk, butter, and cream cheese.  Eat small, frequent meals instead of large meals.  Avoid drinking large amounts of liquid with your meals.  Avoid eating meals during the 2-3 hours before bedtime.  Avoid lying down right after you eat.  Do not exercise right after you eat. Lifestyle   Do not use any products that contain nicotine or tobacco, such as cigarettes, e-cigarettes, and chewing tobacco. If you need help quitting, ask your health care provider.  Try to reduce your stress by using methods such as yoga or meditation. If you need help reducing stress, ask your health care provider.  If you are overweight, reduce your weight to an amount that is healthy for you. Ask your health care provider for guidance about a safe weight loss goal. General instructions  Pay attention to any changes in your symptoms.  Take over-the-counter and prescription medicines only as told by your health care provider. Do not take aspirin, ibuprofen, or other NSAIDs unless your health care provider told you to do so.  Wear loose-fitting clothing. Do not wear anything tight around your waist that causes pressure on your abdomen.  Raise (elevate) the head of your bed about 6 inches (15 cm).  Avoid bending over if this makes your symptoms worse.  Keep all follow-up visits as told by your health care provider. This is important. Contact a health care provider if:  You have: ? New symptoms. ? Unexplained weight loss. ?  Difficulty swallowing or it hurts to swallow. ? Wheezing or a persistent cough. ? A hoarse voice.  Your symptoms do not improve with treatment. Get help right away if you:  Have pain in your arms, neck, jaw, teeth, or back.  Feel sweaty, dizzy, or light-headed.  Have chest pain or shortness of breath.  Vomit and your vomit looks like blood or coffee grounds.  Faint.  Have stool that is bloody or black.  Cannot swallow, drink, or eat. Summary  Gastroesophageal reflux happens when acid from the stomach flows up into the esophagus. GERD is a disease in which the reflux happens often, causes frequent or severe symptoms, or causes problems such as damage to the esophagus.  Treatment for this condition may vary depending on how severe your symptoms are. Your health care provider may recommend diet and lifestyle changes, medicine, or surgery.  Contact a health care provider if you have new or worsening symptoms.  Take over-the-counter and prescription medicines only as told by your health care provider. Do not take aspirin, ibuprofen, or other NSAIDs unless your health care provider told you to do so.  Keep all follow-up visits as told by your health care provider. This is important. This information is not intended  to replace advice given to you by your health care provider. Make sure you discuss any questions you have with your health care provider. Document Released: 04/24/2005 Document Revised: 01/21/2018 Document Reviewed: 01/21/2018 Elsevier Patient Education  [place].  I appreciate the  opportunity to care for you  Thank You   Harl Bowie , MD

## 2019-06-28 NOTE — Progress Notes (Signed)
William Park    [id number]    Jan 08, 1951  Primary Care Physician:Hunter, Brayton Mars, MD  Referring Physician: Marin Olp, MD [address],  [place] [address]   Chief complaint:  Dysphagia, GERD  HPI: 68 yr very pleasant male with history of CAD  He has mucus buildup back of his throat throughout the day, worse after meals and when he is laying down.  Occasionally he wakes up choking, feels like he is going to drown with the saliva and also has burning sensation in his throat.  Complains of intermittent difficulty swallowing especially tough meats like steak or dry chicken, occasionally he has to spit it back up because it does not go down.  Denies any recent food impactions.  He had EGD with esophageal dilation in 1990 by Dr. Posey Pronto in [place] and he also had colonoscopy at the time with removal of polyps per patient, reports not available during this visit to review.  He has chronic GERD, was taking Nexium and ranitidine previously.  Currently taking omeprazole 20 mg daily, has intermittent heartburn when he does not take it.  Denies any rectal bleeding, melena, abdominal pain, nausea or vomiting.  No loss of appetite or recent weight loss.  Negative cologaurd 2018 through Dr. Ansel Bong office  No family history of GI malignancy  He had angioplasty in 1994 and is currently on low-dose aspirin.  Outpatient Encounter Medications as of 06/28/2019  Medication Sig  . aspirin 81 MG tablet Take 81 mg by mouth daily.  . fluticasone (FLONASE) 50 MCG/ACT nasal spray Place 2 sprays into both nostrils daily.  Marland Kitchen losartan (COZAAR) 100 MG tablet Take 1 tablet (100 mg total) by mouth daily.  . meloxicam (MOBIC) 15 MG tablet Take 1 tablet (15 mg total) by mouth daily.  . Multiple Vitamin (MULTIVITAMIN) tablet Take 1 tablet by mouth daily.  Marland Kitchen omeprazole (PRILOSEC) 20 MG capsule Take by mouth.  . simvastatin (ZOCOR) 80 MG tablet Take 1 tablet (80 mg total)  by mouth daily.  . famotidine (PEPCID) 20 MG tablet Take 1 tablet (20 mg total) by mouth at bedtime.  . Na Sulfate-K Sulfate-Mg Sulf (SUPREP BOWEL PREP KIT) 17.5-3.13-1.6 GM/177ML SOLN Take 1 kit by mouth once for 1 dose.  Marland Kitchen omeprazole (PRILOSEC) 40 MG capsule Take 1 capsule (40 mg total) by mouth daily. 30 minutes before breakfast   No facility-administered encounter medications on file as of 06/28/2019.     Allergies as of 06/28/2019  . (No Known Allergies)    Past Medical History:  Diagnosis Date  . Allergic rhinitis    allegra and flonase  . CAD (coronary artery disease)    Dr. [name] s/p 4 angioplasty's with last in 64. ASA. simvastatin 56m  . GERD (gastroesophageal reflux disease)    nexium 470mand zantac BID-mout fills with saliva and coughing. [facility]  . Hyperlipidemia    simvastatin 8031mniaspan 750m63m Hypertension    losartan 50mg46mOsteoarthritis    mainly hands. Mobic 15mg 1mast Surgical History:  Procedure Laterality Date  . ANGIOPLASTY     x4  . pyloric stenosis     as a child    Family History  Problem Relation Age of Onset  . Dementia Father        passed age 51  . 40dney disease Father        CKD  .  CAD Father   . AAA (abdominal aortic aneurysm) Father   . Breast cancer Mother   . Congestive Heart Failure Mother        53 time of death  . CAD Brother     Social History   Socioeconomic History  . Marital status: Divorced    Spouse name: Not on file  . Number of children: Not on file  . Years of education: Not on file  . Highest education level: Not on file  Occupational History  . Not on file  Social Needs  . Financial resource strain: Not on file  . Food insecurity    Worry: Not on file    Inability: Not on file  . Transportation needs    Medical: Not on file    Non-medical: Not on file  Tobacco Use  . Smoking status: Former Smoker    Packs/day: 2.00    Years: 25.00    Pack years: 50.00    Types:  Cigarettes    Quit date: 07/30/1987    Years since quitting: 31.9  . Smokeless tobacco: Never Used  Substance and Sexual Activity  . Alcohol use: No    Alcohol/week: 0.0 standard drinks  . Drug use: No  . Sexual activity: Not on file  Lifestyle  . Physical activity    Days per week: Not on file    Minutes per session: Not on file  . Stress: Not on file  Relationships  . Social Herbalist on phone: Not on file    Gets together: Not on file    Attends religious service: Not on file    Active member of club or organization: Not on file    Attends meetings of clubs or organizations: Not on file    Relationship status: Not on file  . Intimate partner violence    Fear of current or ex partner: Not on file    Emotionally abused: Not on file    Physically abused: Not on file    Forced sexual activity: Not on file  Other Topics Concern  . Not on file  Social History Narrative   Family: Divorced. 2 daughters. 3 granddaughters. Doesn't get to see them.       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  HENT: Negative.   Eyes: Negative for blurred vision.  Respiratory: Negative for cough, shortness of breath and wheezing.   Cardiovascular: Negative for chest pain and palpitations.  Gastrointestinal: as per HPI Genitourinary: Negative for dysuria, urgency, frequency and hematuria.  Musculoskeletal: Negative for myalgias, back pain and positive for joint pain.  Skin: Negative for itching and rash.  Neurological: Negative for dizziness, tremors, focal weakness, seizures and loss of consciousness.  Endo/Heme/Allergies: Positive for seasonal allergies.  Psychiatric/Behavioral: Negative for depression, suicidal ideas and hallucinations.  All other systems reviewed and are negative.   Physical Exam: Vitals:   06/28/19 1420  BP: 120/70   Pulse: 60  Temp: 97.9 F (36.6 C)   Body mass index is 30.68 kg/m. Gen:      No acute distress HEENT:  EOMI, sclera anicteric Neck:     No masses; no thyromegaly Lungs:    Clear to auscultation bilaterally; normal respiratory effort CV:         Regular rate and rhythm; no murmurs  Abd:      + bowel sounds; soft, non-tender; no palpable masses, no distension Ext:    No edema; adequate peripheral perfusion Skin:      Warm and dry; no rash Neuro: alert and oriented x 3 Psych: normal mood and affect  Data Reviewed:  Reviewed labs, radiology imaging, old records and pertinent past GI work up   Assessment and Plan/Recommendations:  68 year old male with history of hypertension, CAD on aspirin, chronic GERD with complaints of dysphagia. Schedule for EGD with possible esophageal biopsies and dilation Differential includes peptic stricture, erosive esophagitis, will need to exclude neoplastic lesion or malignancy  GERD: Increase omeprazole to 40 mg daily Add Pepcid 20 mg at bedtime Discussed antireflux measures and lifestyle modification  He is due for colorectal cancer screening, Cologuard negative in 2018 but has history of colon polyps per patient.  Will schedule for colonoscopy along with EGD  The risks and benefits as well as alternatives of endoscopic procedure(s) have been discussed and reviewed. All questions answered. The patient agrees to proceed.  Advised patient to continue low-dose aspirin   K. Denzil Magnuson , MD    CC: Marin Olp, MD

## 2019-07-02 ENCOUNTER — Ambulatory Visit (INDEPENDENT_AMBULATORY_CARE_PROVIDER_SITE_OTHER): Payer: Medicare Other

## 2019-07-02 DIAGNOSIS — Z1159 Encounter for screening for other viral diseases: Secondary | ICD-10-CM

## 2019-07-02 LAB — SARS CORONAVIRUS 2 (TAT 6-24 HRS): SARS Coronavirus 2: NEGATIVE

## 2019-07-06 ENCOUNTER — Encounter: Payer: Self-pay | Admitting: Gastroenterology

## 2019-07-06 ENCOUNTER — Other Ambulatory Visit: Payer: Self-pay

## 2019-07-06 ENCOUNTER — Ambulatory Visit (AMBULATORY_SURGERY_CENTER): Payer: Medicare Other | Admitting: Gastroenterology

## 2019-07-06 VITALS — BP 117/67 | HR 54 | Temp 97.6°F | Resp 22 | Ht 67.0 in | Wt 220.0 lb

## 2019-07-06 DIAGNOSIS — R1310 Dysphagia, unspecified: Secondary | ICD-10-CM

## 2019-07-06 DIAGNOSIS — Z1211 Encounter for screening for malignant neoplasm of colon: Secondary | ICD-10-CM

## 2019-07-06 DIAGNOSIS — K2950 Unspecified chronic gastritis without bleeding: Secondary | ICD-10-CM | POA: Diagnosis not present

## 2019-07-06 DIAGNOSIS — K219 Gastro-esophageal reflux disease without esophagitis: Secondary | ICD-10-CM | POA: Diagnosis not present

## 2019-07-06 DIAGNOSIS — K222 Esophageal obstruction: Secondary | ICD-10-CM

## 2019-07-06 MED ORDER — SODIUM CHLORIDE 0.9 % IV SOLN: 500.0000 mL | Freq: Once | INTRAVENOUS | Status: DC

## 2019-07-06 NOTE — Op Note (Signed)
[place] Patient Name: William Park [facility] Procedure Date: 07/06/2019 9:59 AM MRN: [id number] Endoscopist: Mauri Pole , MD Age: 68 Referring MD:  Date of Birth: 1951/03/11 Gender: Male Account #: [id number] Procedure:                Upper GI endoscopy Indications:              Dysphagia Medicines:                Monitored Anesthesia Care Procedure:                Pre-Anesthesia Assessment:                           - Prior to the procedure, a History and Physical                            was performed, and patient medications and                            allergies were reviewed. The patient's tolerance of                            previous anesthesia was also reviewed. The risks                            and benefits of the procedure and the sedation                            options and risks were discussed with the patient.                            All questions were answered, and informed consent                            was obtained. Prior Anticoagulants: The patient has                            taken no previous anticoagulant or antiplatelet                            agents. ASA Grade Assessment: II - A patient with                            mild systemic disease. After reviewing the risks                            and benefits, the patient was deemed in                            satisfactory condition to undergo the procedure.                           After obtaining informed consent, the endoscope was  passed under direct vision. Throughout the                            procedure, the patient's blood pressure, pulse, and                            oxygen saturations were monitored continuously. The                            Endoscope was introduced through the mouth, and                            advanced to the second part of duodenum. The upper                            GI endoscopy was accomplished without  difficulty.                            The patient tolerated the procedure well. Scope In: Scope Out: Findings:                 One benign-appearing, intrinsic mild                            (non-circumferential scarring) stenosis was found                            38 to 39 cm from the incisors. This stenosis                            measured 2 cm (inner diameter) x less than one cm                            (in length). The stenosis was traversed. A TTS                            dilator was passed through the scope. Dilation with                            an 18-19-20 mm balloon dilator was performed to 20                            mm. The dilation site was examined following                            endoscope reinsertion and showed mild mucosal                            disruption.                           A small hiatal hernia was present.                           Mildly  erythematous mucosa without bleeding was                            found in the gastric antrum and in the prepyloric                            region of the stomach. Biopsies were taken with a                            cold forceps for Helicobacter pylori testing.                           The examined duodenum was normal. Complications:            No immediate complications. Estimated Blood Loss:     Estimated blood loss was minimal. Impression:               - Benign-appearing esophageal stenosis. Dilated.                           - Small hiatal hernia.                           - Erythematous mucosa in the antrum and prepyloric                            region of the stomach. Biopsied.                           - Normal examined duodenum. Recommendation:           - Patient has a contact number available for                            emergencies. The signs and symptoms of potential                            delayed complications were discussed with the                            patient. Return  to normal activities tomorrow.                            Written discharge instructions were provided to the                            patient.                           - Resume previous diet.                           - Continue present medications.                           - Await pathology results.                           -  Follow an antireflux regimen indefinitely.                           - Return to GI office in 3 months. Mauri Pole, MD 07/06/2019 10:35:50 AM This report has been signed electronically.

## 2019-07-06 NOTE — Patient Instructions (Signed)
Information on hemorrhoids, diverticulosis, esophagitis and hiatal hernias given to you today.  Await pathology results.  Repeat colonoscopy in 10 years.  Return to GI clinic in 3 months.   YOU HAD AN ENDOSCOPIC PROCEDURE TODAY AT [facility]:   Refer to the procedure report that was given to you for any specific questions about what was found during the examination.  If the procedure report does not answer your questions, please call your gastroenterologist to clarify.  If you requested that your care partner not be given the details of your procedure findings, then the procedure report has been included in a sealed envelope for you to review at your convenience later.  YOU SHOULD EXPECT: Some feelings of bloating in the abdomen. Passage of more gas than usual.  Walking can help get rid of the air that was put into your GI tract during the procedure and reduce the bloating. If you had a lower endoscopy (such as a colonoscopy or flexible sigmoidoscopy) you may notice spotting of blood in your stool or on the toilet paper. If you underwent a bowel prep for your procedure, you may not have a normal bowel movement for a few days.  Please Note:  You might notice some irritation and congestion in your nose or some drainage.  This is from the oxygen used during your procedure.  There is no need for concern and it should clear up in a day or so.  SYMPTOMS TO REPORT IMMEDIATELY:   Following lower endoscopy (colonoscopy or flexible sigmoidoscopy):  Excessive amounts of blood in the stool  Significant tenderness or worsening of abdominal pains  Swelling of the abdomen that is new, acute  Fever of 100F or higher   Following upper endoscopy (EGD)  Vomiting of blood or coffee ground material  New chest pain or pain under the shoulder blades  Painful or persistently difficult swallowing  New shortness of breath  Fever of 100F or higher  Black, tarry-looking stools  For urgent or  emergent issues, a gastroenterologist can be reached at any hour by calling [phone].   DIET:  We do recommend a small meal at first, but then you may proceed to your regular diet.  Drink plenty of fluids but you should avoid alcoholic beverages for 24 hours.  ACTIVITY:  You should plan to take it easy for the rest of today and you should NOT DRIVE or use heavy machinery until tomorrow (because of the sedation medicines used during the test).    FOLLOW UP: Our staff will call the number listed on your records 48-72 hours following your procedure to check on you and address any questions or concerns that you may have regarding the information given to you following your procedure. If we do not reach you, we will leave a message.  We will attempt to reach you two times.  During this call, we will ask if you have developed any symptoms of COVID 19. If you develop any symptoms (ie: fever, flu-like symptoms, shortness of breath, cough etc.) before then, please call [phone].  If you test positive for Covid 19 in the 2 weeks post procedure, please call and report this information to [place].    If any biopsies were taken you will be contacted by phone or by letter within the next 1-3 weeks.  Please call us at [phone] if you have not heard about the biopsies in 3 weeks.    SIGNATURES/CONFIDENTIALITY: You and/or your care partner have signed  paperwork which will be entered into your electronic medical record.  These signatures attest to the fact that that the information above on your After Visit Summary has been reviewed and is understood.  Full responsibility of the confidentiality of this discharge information lies with you and/or your care-partner.

## 2019-07-06 NOTE — Progress Notes (Signed)
To PACU, VSS. Report to [email or web address] 

## 2019-07-06 NOTE — Op Note (Signed)
[place] Patient Name: William Park [facility] Procedure Date: 07/06/2019 9:59 AM MRN: [id number] Endoscopist: Mauri Pole , MD Age: 68 Referring MD:  Date of Birth: 12/11/1950 Gender: Male Account #: [id number] Procedure:                Colonoscopy Indications:              Screening for colorectal malignant neoplasm Medicines:                Monitored Anesthesia Care Procedure:                Pre-Anesthesia Assessment:                           - Prior to the procedure, a History and Physical                            was performed, and patient medications and                            allergies were reviewed. The patient's tolerance of                            previous anesthesia was also reviewed. The risks                            and benefits of the procedure and the sedation                            options and risks were discussed with the patient.                            All questions were answered, and informed consent                            was obtained. Prior Anticoagulants: The patient has                            taken no previous anticoagulant or antiplatelet                            agents. ASA Grade Assessment: II - A patient with                            mild systemic disease. After reviewing the risks                            and benefits, the patient was deemed in                            satisfactory condition to undergo the procedure.                           After obtaining informed consent, the colonoscope  was passed under direct vision. Throughout the                            procedure, the patient's blood pressure, pulse, and                            oxygen saturations were monitored continuously. The                            Colonoscope was introduced through the anus and                            advanced to the the cecum, identified by                            appendiceal orifice  and ileocecal valve. The                            colonoscopy was performed without difficulty. The                            patient tolerated the procedure well. The quality                            of the bowel preparation was excellent. The                            ileocecal valve, appendiceal orifice, and rectum                            were photographed. Scope In: 10:14:29 AM Scope Out: 10:30:50 AM Scope Withdrawal Time: 0 hours 11 minutes 46 seconds  Total Procedure Duration: 0 hours 16 minutes 21 seconds  Findings:                 The perianal and digital rectal examinations were                            normal.                           A few small-mouthed diverticula were found in the                            sigmoid colon, descending colon, transverse colon                            and ascending colon.                           Non-bleeding internal hemorrhoids were found during                            retroflexion. The hemorrhoids were small.  The exam was otherwise without abnormality. Complications:            No immediate complications. Estimated Blood Loss:     Estimated blood loss was minimal. Impression:               - Diverticulosis in the sigmoid colon, in the                            descending colon and in the ascending colon.                           - Non-bleeding internal hemorrhoids.                           - The examination was otherwise normal.                           - No specimens collected. Recommendation:           - Patient has a contact number available for                            emergencies. The signs and symptoms of potential                            delayed complications were discussed with the                            patient. Return to normal activities tomorrow.                            Written discharge instructions were provided to the                            patient.                            - Resume previous diet.                           - Continue present medications.                           - Repeat colonoscopy in 10 years for screening                            purposes. Mauri Pole, MD 07/06/2019 10:37:48 AM This report has been signed electronically.

## 2019-07-06 NOTE — Progress Notes (Signed)
VS-Donna Thomas Temperature-Lisa Clapps 

## 2019-07-06 NOTE — Progress Notes (Signed)
Called to room to assist during endoscopic procedure.  Patient ID and intended procedure confirmed with present staff. Received instructions for my participation in the procedure from the performing physician.  

## 2019-07-08 ENCOUNTER — Telehealth: Payer: Self-pay | Admitting: *Deleted

## 2019-07-08 NOTE — Telephone Encounter (Signed)
1. Have you developed a fever since your procedure? no  2.   Have you had an respiratory symptoms (SOB or cough) since your procedure? no  3.   Have you tested positive for COVID 19 since your procedure no  4.   Have you had any family members/close contacts diagnosed with the COVID 19 since your procedure?  no   If yes to any of these questions please route to Joylene John, RN and Alphonsa Gin, Therapist, sports.  Follow up Call-  Call back number 07/06/2019  Post procedure Call Back phone  # [phone]  Permission to leave phone message Yes  Some recent data might be hidden     Patient questions:  Do you have a fever, pain , or abdominal swelling? No. Pain Score  0 *  Have you tolerated food without any problems? Yes.    Have you been able to return to your normal activities? Yes.    Do you have any questions about your discharge instructions: Diet   No. Medications  No. Follow up visit  No.  Do you have questions or concerns about your Care? No.  Actions: * If pain score is 4 or above: No action needed, pain <4.

## 2019-07-09 ENCOUNTER — Encounter: Payer: Self-pay | Admitting: Gastroenterology

## 2019-08-02 DIAGNOSIS — S233XXA Sprain of ligaments of thoracic spine, initial encounter: Secondary | ICD-10-CM | POA: Diagnosis not present

## 2019-08-02 DIAGNOSIS — S134XXA Sprain of ligaments of cervical spine, initial encounter: Secondary | ICD-10-CM | POA: Diagnosis not present

## 2019-08-02 DIAGNOSIS — S335XXA Sprain of ligaments of lumbar spine, initial encounter: Secondary | ICD-10-CM | POA: Diagnosis not present

## 2019-08-02 DIAGNOSIS — M9901 Segmental and somatic dysfunction of cervical region: Secondary | ICD-10-CM | POA: Diagnosis not present

## 2019-08-02 DIAGNOSIS — M9903 Segmental and somatic dysfunction of lumbar region: Secondary | ICD-10-CM | POA: Diagnosis not present

## 2019-08-02 DIAGNOSIS — M9902 Segmental and somatic dysfunction of thoracic region: Secondary | ICD-10-CM | POA: Diagnosis not present

## 2019-08-09 DIAGNOSIS — M9902 Segmental and somatic dysfunction of thoracic region: Secondary | ICD-10-CM | POA: Diagnosis not present

## 2019-08-09 DIAGNOSIS — S233XXA Sprain of ligaments of thoracic spine, initial encounter: Secondary | ICD-10-CM | POA: Diagnosis not present

## 2019-08-09 DIAGNOSIS — S335XXA Sprain of ligaments of lumbar spine, initial encounter: Secondary | ICD-10-CM | POA: Diagnosis not present

## 2019-08-09 DIAGNOSIS — M9901 Segmental and somatic dysfunction of cervical region: Secondary | ICD-10-CM | POA: Diagnosis not present

## 2019-08-09 DIAGNOSIS — M9903 Segmental and somatic dysfunction of lumbar region: Secondary | ICD-10-CM | POA: Diagnosis not present

## 2019-08-09 DIAGNOSIS — S134XXA Sprain of ligaments of cervical spine, initial encounter: Secondary | ICD-10-CM | POA: Diagnosis not present

## 2019-08-16 DIAGNOSIS — M9901 Segmental and somatic dysfunction of cervical region: Secondary | ICD-10-CM | POA: Diagnosis not present

## 2019-08-16 DIAGNOSIS — M9903 Segmental and somatic dysfunction of lumbar region: Secondary | ICD-10-CM | POA: Diagnosis not present

## 2019-08-16 DIAGNOSIS — M9902 Segmental and somatic dysfunction of thoracic region: Secondary | ICD-10-CM | POA: Diagnosis not present

## 2019-08-16 DIAGNOSIS — S335XXA Sprain of ligaments of lumbar spine, initial encounter: Secondary | ICD-10-CM | POA: Diagnosis not present

## 2019-08-16 DIAGNOSIS — S233XXA Sprain of ligaments of thoracic spine, initial encounter: Secondary | ICD-10-CM | POA: Diagnosis not present

## 2019-08-16 DIAGNOSIS — S134XXA Sprain of ligaments of cervical spine, initial encounter: Secondary | ICD-10-CM | POA: Diagnosis not present

## 2019-08-17 DIAGNOSIS — S134XXA Sprain of ligaments of cervical spine, initial encounter: Secondary | ICD-10-CM | POA: Diagnosis not present

## 2019-08-17 DIAGNOSIS — S335XXA Sprain of ligaments of lumbar spine, initial encounter: Secondary | ICD-10-CM | POA: Diagnosis not present

## 2019-08-17 DIAGNOSIS — M9902 Segmental and somatic dysfunction of thoracic region: Secondary | ICD-10-CM | POA: Diagnosis not present

## 2019-08-17 DIAGNOSIS — M9901 Segmental and somatic dysfunction of cervical region: Secondary | ICD-10-CM | POA: Diagnosis not present

## 2019-08-17 DIAGNOSIS — S233XXA Sprain of ligaments of thoracic spine, initial encounter: Secondary | ICD-10-CM | POA: Diagnosis not present

## 2019-08-17 DIAGNOSIS — M9903 Segmental and somatic dysfunction of lumbar region: Secondary | ICD-10-CM | POA: Diagnosis not present

## 2019-08-20 DIAGNOSIS — M9901 Segmental and somatic dysfunction of cervical region: Secondary | ICD-10-CM | POA: Diagnosis not present

## 2019-08-20 DIAGNOSIS — M9902 Segmental and somatic dysfunction of thoracic region: Secondary | ICD-10-CM | POA: Diagnosis not present

## 2019-08-20 DIAGNOSIS — S233XXA Sprain of ligaments of thoracic spine, initial encounter: Secondary | ICD-10-CM | POA: Diagnosis not present

## 2019-08-20 DIAGNOSIS — S134XXA Sprain of ligaments of cervical spine, initial encounter: Secondary | ICD-10-CM | POA: Diagnosis not present

## 2019-08-20 DIAGNOSIS — M9903 Segmental and somatic dysfunction of lumbar region: Secondary | ICD-10-CM | POA: Diagnosis not present

## 2019-08-20 DIAGNOSIS — S335XXA Sprain of ligaments of lumbar spine, initial encounter: Secondary | ICD-10-CM | POA: Diagnosis not present

## 2019-08-23 DIAGNOSIS — M9902 Segmental and somatic dysfunction of thoracic region: Secondary | ICD-10-CM | POA: Diagnosis not present

## 2019-08-23 DIAGNOSIS — S134XXA Sprain of ligaments of cervical spine, initial encounter: Secondary | ICD-10-CM | POA: Diagnosis not present

## 2019-08-23 DIAGNOSIS — S233XXA Sprain of ligaments of thoracic spine, initial encounter: Secondary | ICD-10-CM | POA: Diagnosis not present

## 2019-08-23 DIAGNOSIS — S335XXA Sprain of ligaments of lumbar spine, initial encounter: Secondary | ICD-10-CM | POA: Diagnosis not present

## 2019-08-23 DIAGNOSIS — M9903 Segmental and somatic dysfunction of lumbar region: Secondary | ICD-10-CM | POA: Diagnosis not present

## 2019-08-23 DIAGNOSIS — M9901 Segmental and somatic dysfunction of cervical region: Secondary | ICD-10-CM | POA: Diagnosis not present

## 2019-08-30 DIAGNOSIS — M9902 Segmental and somatic dysfunction of thoracic region: Secondary | ICD-10-CM | POA: Diagnosis not present

## 2019-08-30 DIAGNOSIS — S233XXA Sprain of ligaments of thoracic spine, initial encounter: Secondary | ICD-10-CM | POA: Diagnosis not present

## 2019-08-30 DIAGNOSIS — S134XXA Sprain of ligaments of cervical spine, initial encounter: Secondary | ICD-10-CM | POA: Diagnosis not present

## 2019-08-30 DIAGNOSIS — S335XXA Sprain of ligaments of lumbar spine, initial encounter: Secondary | ICD-10-CM | POA: Diagnosis not present

## 2019-08-30 DIAGNOSIS — M9903 Segmental and somatic dysfunction of lumbar region: Secondary | ICD-10-CM | POA: Diagnosis not present

## 2019-08-30 DIAGNOSIS — M9901 Segmental and somatic dysfunction of cervical region: Secondary | ICD-10-CM | POA: Diagnosis not present

## 2019-09-06 DIAGNOSIS — H2511 Age-related nuclear cataract, right eye: Secondary | ICD-10-CM | POA: Diagnosis not present

## 2019-09-06 DIAGNOSIS — H2512 Age-related nuclear cataract, left eye: Secondary | ICD-10-CM | POA: Diagnosis not present

## 2019-09-06 DIAGNOSIS — H43813 Vitreous degeneration, bilateral: Secondary | ICD-10-CM | POA: Diagnosis not present

## 2019-09-27 DIAGNOSIS — M9902 Segmental and somatic dysfunction of thoracic region: Secondary | ICD-10-CM | POA: Diagnosis not present

## 2019-09-27 DIAGNOSIS — S233XXA Sprain of ligaments of thoracic spine, initial encounter: Secondary | ICD-10-CM | POA: Diagnosis not present

## 2019-09-27 DIAGNOSIS — M9903 Segmental and somatic dysfunction of lumbar region: Secondary | ICD-10-CM | POA: Diagnosis not present

## 2019-09-27 DIAGNOSIS — S134XXA Sprain of ligaments of cervical spine, initial encounter: Secondary | ICD-10-CM | POA: Diagnosis not present

## 2019-09-27 DIAGNOSIS — S335XXA Sprain of ligaments of lumbar spine, initial encounter: Secondary | ICD-10-CM | POA: Diagnosis not present

## 2019-09-27 DIAGNOSIS — M9901 Segmental and somatic dysfunction of cervical region: Secondary | ICD-10-CM | POA: Diagnosis not present

## 2019-11-02 DIAGNOSIS — Z01812 Encounter for preprocedural laboratory examination: Secondary | ICD-10-CM | POA: Diagnosis not present

## 2019-11-02 DIAGNOSIS — Z20822 Contact with and (suspected) exposure to covid-19: Secondary | ICD-10-CM | POA: Diagnosis not present

## 2019-11-04 DIAGNOSIS — H25811 Combined forms of age-related cataract, right eye: Secondary | ICD-10-CM | POA: Diagnosis not present

## 2019-11-04 DIAGNOSIS — I10 Essential (primary) hypertension: Secondary | ICD-10-CM | POA: Diagnosis not present

## 2019-11-04 DIAGNOSIS — H2511 Age-related nuclear cataract, right eye: Secondary | ICD-10-CM | POA: Diagnosis not present

## 2019-11-04 DIAGNOSIS — Z9861 Coronary angioplasty status: Secondary | ICD-10-CM | POA: Diagnosis not present

## 2019-11-04 DIAGNOSIS — I252 Old myocardial infarction: Secondary | ICD-10-CM | POA: Diagnosis not present

## 2019-11-04 DIAGNOSIS — H43813 Vitreous degeneration, bilateral: Secondary | ICD-10-CM | POA: Diagnosis not present

## 2019-11-05 DIAGNOSIS — H2512 Age-related nuclear cataract, left eye: Secondary | ICD-10-CM | POA: Diagnosis not present

## 2019-11-13 ENCOUNTER — Other Ambulatory Visit: Payer: Self-pay | Admitting: Family Medicine

## 2019-11-15 DIAGNOSIS — I10 Essential (primary) hypertension: Secondary | ICD-10-CM | POA: Diagnosis not present

## 2019-11-15 DIAGNOSIS — N289 Disorder of kidney and ureter, unspecified: Secondary | ICD-10-CM | POA: Diagnosis not present

## 2019-11-15 DIAGNOSIS — E785 Hyperlipidemia, unspecified: Secondary | ICD-10-CM | POA: Diagnosis not present

## 2019-11-15 DIAGNOSIS — I2510 Atherosclerotic heart disease of native coronary artery without angina pectoris: Secondary | ICD-10-CM | POA: Diagnosis not present

## 2019-11-15 DIAGNOSIS — Z125 Encounter for screening for malignant neoplasm of prostate: Secondary | ICD-10-CM | POA: Diagnosis not present

## 2019-11-16 DIAGNOSIS — Z01812 Encounter for preprocedural laboratory examination: Secondary | ICD-10-CM | POA: Diagnosis not present

## 2019-11-16 DIAGNOSIS — Z20822 Contact with and (suspected) exposure to covid-19: Secondary | ICD-10-CM | POA: Diagnosis not present

## 2019-11-18 DIAGNOSIS — I2510 Atherosclerotic heart disease of native coronary artery without angina pectoris: Secondary | ICD-10-CM | POA: Diagnosis not present

## 2019-11-18 DIAGNOSIS — Z961 Presence of intraocular lens: Secondary | ICD-10-CM | POA: Diagnosis not present

## 2019-11-18 DIAGNOSIS — H2512 Age-related nuclear cataract, left eye: Secondary | ICD-10-CM | POA: Diagnosis not present

## 2019-11-18 DIAGNOSIS — I10 Essential (primary) hypertension: Secondary | ICD-10-CM | POA: Diagnosis not present

## 2019-11-18 DIAGNOSIS — Z9841 Cataract extraction status, right eye: Secondary | ICD-10-CM | POA: Diagnosis not present

## 2019-11-18 DIAGNOSIS — E785 Hyperlipidemia, unspecified: Secondary | ICD-10-CM | POA: Diagnosis not present

## 2019-11-18 DIAGNOSIS — H25812 Combined forms of age-related cataract, left eye: Secondary | ICD-10-CM | POA: Diagnosis not present

## 2019-11-22 DIAGNOSIS — Z23 Encounter for immunization: Secondary | ICD-10-CM | POA: Diagnosis not present

## 2019-12-17 ENCOUNTER — Encounter: Payer: Self-pay | Admitting: Gastroenterology

## 2019-12-20 DIAGNOSIS — Z23 Encounter for immunization: Secondary | ICD-10-CM | POA: Diagnosis not present

## 2019-12-21 ENCOUNTER — Other Ambulatory Visit: Payer: Self-pay | Admitting: Family Medicine

## 2020-02-17 ENCOUNTER — Encounter: Payer: Self-pay | Admitting: Family Medicine

## 2020-02-17 ENCOUNTER — Telehealth (INDEPENDENT_AMBULATORY_CARE_PROVIDER_SITE_OTHER): Payer: Medicare Other | Admitting: Family Medicine

## 2020-02-17 DIAGNOSIS — Z639 Problem related to primary support group, unspecified: Secondary | ICD-10-CM

## 2020-02-17 DIAGNOSIS — F439 Reaction to severe stress, unspecified: Secondary | ICD-10-CM

## 2020-02-17 DIAGNOSIS — R4589 Other symptoms and signs involving emotional state: Secondary | ICD-10-CM

## 2020-02-17 NOTE — Progress Notes (Signed)
Virtual Visit via Video Note  I connected with Jimmy  on 02/17/20 at  1:00 PM EDT by a video enabled telemedicine application and verified that I am speaking with the correct person using two identifiers.  Location patient: home, [place] Location provider:work or home office Persons participating in the virtual visit: patient, provider, daughter  I discussed the limitations of evaluation and management by telemedicine and the availability of in person appointments. The patient expressed understanding and agreed to proceed.   HPI:  Acute visit Depression: -has PMH of depression years ago when went through a divorce, but these symptoms started at the beginning of this week -a 28 year relationship ended and he is struggling with this -he has felt sad, emotional, lack of appetite, has not gone to work for 2 days due to lack of motivation, has fleeting thoughts of wishing it was all over - but denies thoughts of committing suicide or harm to self or others or plan, reports feels would never hurt himself -prefers to avoid medications as did not feel helped remotely; he did feel that counseling helped in the past   ROS: See pertinent positives and negatives per HPI.  Past Medical History:  Diagnosis Date  . Allergic rhinitis    allegra and flonase  . CAD (coronary artery disease)    Dr. [name] s/p 4 angioplasty's with last in 9. ASA. simvastatin 80mg   . GERD (gastroesophageal reflux disease)    nexium 40mg  and zantac BID-mout fills with saliva and coughing. [facility] GI in [facility] 12/2014  . Hyperlipidemia    simvastatin 80mg , niaspan 750mg   . Hypertension    losartan 50mg   . Myocardial infarction ([place])    "small MI, mild"- 1994  . Osteoarthritis    mainly hands. Mobic 15mg     Past Surgical History:  Procedure Laterality Date  . ANGIOPLASTY     x4  . COLONOSCOPY    . pyloric stenosis     as a child  . UPPER GASTROINTESTINAL ENDOSCOPY      Family History  Problem Relation Age of  Onset  . Dementia Father        passed age 57  . Kidney disease Father        CKD  . CAD Father   . AAA (abdominal aortic aneurysm) Father   . Breast cancer Mother   . Congestive Heart Failure Mother        54 time of death  . CAD Brother   . Colon cancer Neg Hx   . Esophageal cancer Neg Hx   . Stomach cancer Neg Hx   . Rectal cancer Neg Hx     SOCIAL HX: see hpi   Current Outpatient Medications:  .  aspirin 81 MG tablet, Take 81 mg by mouth daily., Disp: , Rfl:  .  famotidine (PEPCID) 20 MG tablet, TAKE 1 TABLET BY MOUTH TWICE DAILY, Disp: 180 tablet, Rfl: 0 .  fluticasone (FLONASE) 50 MCG/ACT nasal spray, SPRAY 2 SPRAYS INTO EACH NOSTRIL ONCE DAILY, Disp: 48 g, Rfl: 0 .  losartan (COZAAR) 50 MG tablet, TAKE 2 TABLETS BY MOUTH ONCE DAILY., Disp: 180 tablet, Rfl: 0 .  Multiple Vitamin (MULTIVITAMIN) tablet, Take 1 tablet by mouth daily., Disp: , Rfl:  .  omeprazole (PRILOSEC) 40 MG capsule, Take 1 capsule (40 mg total) by mouth daily. 30 minutes before breakfast, Disp: 90 capsule, Rfl: 3 .  simvastatin (ZOCOR) 80 MG tablet, TAKE 1 TABLET BY MOUTH ONCE DAILY., Disp: 90 tablet, Rfl: 0  EXAM:  VITALS per patient if applicable:  GENERAL: alert, oriented, appears well and in no acute distress  HEENT: atraumatic, conjunttiva clear, no obvious abnormalities on inspection of external nose and ears  NECK: normal movements of the head and neck  LUNGS: on inspection no signs of respiratory distress, breathing rate appears normal, no obvious gross SOB, gasping or wheezing  CV: no obvious cyanosis  MS: moves all visible extremities without noticeable abnormality  PSYCH/NEURO: pleasant and cooperative, speech and thought processing grossly intact  ASSESSMENT AND PLAN:  Discussed the following assessment and plan:  Stress  Relationship problems  Depressed mood  -we discussed possible serious and likely etiologies, options for evaluation and workup, limitations of  telemedicine visit vs in person visit, treatment, treatment risks and precautions. Pt prefers to treat via telemedicine empirically rather then risking or undertaking an in person visit at this moment. Discussed common symptoms of loss of relationship and treatment options. Do feel that prompt CBT is warranted and advised of options with [facility] preferred; provided on demand options if is unable to get prompt help at [facility]. Advised close follow up in 1-2 weeks. Advised to seek immediate care (emergency care, 911 if severe symptoms) if worsening, SI, thoughts of harm or severe symptoms. Patient and daughter agree. > 20 minutes spent on this visit.   I discussed the assessment and treatment plan with the patient. The patient was provided an opportunity to ask questions and all were answered. The patient agreed with the plan and demonstrated an understanding of the instructions.   The patient was advised to call back or seek an in-person evaluation if the symptoms worsen or if the condition fails to improve as anticipated.   Lucretia Kern, DO

## 2020-02-17 NOTE — Patient Instructions (Signed)
[]   Schedule cognitive behavioral therapy (CBT)   [facility] is a good option.   Call for appointment: [phone]  []  Follow up with your doctor in 1-2 weeks. Seek care sooner if worsening, thoughts of harm to yourself or others or other severe symptoms. Call 911 and seek emergency care if severe symptoms or thoughts of hurting yourself or others.    I hope you are feeling better soon.

## 2020-02-21 ENCOUNTER — Telehealth: Payer: Self-pay | Admitting: Family Medicine

## 2020-02-21 NOTE — Telephone Encounter (Signed)
Pt has appointment 08/02.

## 2020-02-21 NOTE — Telephone Encounter (Signed)
Nurse Assessment Nurse: Vallery Sa, RN, Cathy Date/Time (Eastern Time): 02/17/2020 11:47:59 AM Confirm and document reason for call. If symptomatic, describe symptoms. ---Jeneen Rinks states that he has been very depressed for about 2 days. He has not done anything to harm himself or anyone else. He has not been planning to end his life. He has been drinking more than usual and feels he is in withdrawal. No fever, cold, cough, or pain. Alert and responsive. Has the patient had close contact with a person known or suspected to have the novel coronavirus illness OR traveled / lives in area with major community spread (including international travel) in the last 14 days from the onset of symptoms? * If Asymptomatic, screen for exposure and travel within the last 14 days. ---No Does the patient have any new or worsening symptoms? ---Yes Will a triage be completed? ---Yes Related visit to physician within the last 2 weeks? ---No Does the PT have any chronic conditions? (i.e. diabetes, asthma, this includes High risk factors for pregnancy, etc.) ---Yes List chronic conditions. ---Depression in the past, Alcohol use, Heart problems Is this a behavioral health or substance abuse call? ---No Guidelines Guideline Title Affirmed Question Affirmed Notes Nurse Date/Time (Eastern Time) Depression [1] Depression AND [2] unable to do any of normal activities (e.g., [place], RN, T[place] 02/17/2020 11:51:08 AMPLEASE NOTE: All timestamps contained within this report are represented as [place] Standard Time. CONFIDENTIALTY NOTICE: This fax transmission is intended only for the addressee. It contains information that is legally privileged, confidential or otherwise protected from use or disclosure. If you are not the intended recipient, you are strictly prohibited from reviewing, disclosing, copying using or disseminating any of this information or taking any action in reliance on or regarding this information. If  you have received this fax in error, please notify us immediately by telephone so that we can arrange for its return to [place]. Phone: [phone], Toll-Free: [phone], Fax: [phone] Page: 2 of 2 Call Id: [id number] Guidelines Guideline Title Affirmed Question Affirmed Notes Nurse Date/Time Eilene Ghazi Time) self care, school, work; in comparison to baseline). Disp. Time Eilene Ghazi Time) Disposition Final User 02/17/2020 11:34:58 AM Attempt made - message left Vallery Sa, RN, [facility] 02/17/2020 11:47:06 AM Attempt made - message left Vallery Sa, RN, [place] 02/17/2020 11:52:03 AM Go to ED Now Yes Vallery Sa, RN, [facility] Disagree/Comply Comply Caller Understands Yes PreDisposition Did not know what to do Care Advice Given Per Guideline GO TO ED NOW: * You need to be seen in the Emergency Department. * Go to the ED at ___________ [place] now. Drive carefully. ALTERNATE DISPOSITION - CALL MENTAL HEALTH PROFESSIONAL NOW: * If patient has a Lexicographer, psychologist or counselor, recommend the caller phone the mental health professional now. ANOTHER ADULT SHOULD DRIVE: * It is better and safer if another adult drives instead of you. CARE ADVICE given per Depression (Adult) guideline. Referrals GO TO FACILITY OTHER - SPECIFY

## 2020-02-28 ENCOUNTER — Ambulatory Visit (INDEPENDENT_AMBULATORY_CARE_PROVIDER_SITE_OTHER): Payer: Medicare Other | Admitting: Family Medicine

## 2020-02-28 ENCOUNTER — Encounter: Payer: Self-pay | Admitting: Family Medicine

## 2020-02-28 ENCOUNTER — Other Ambulatory Visit: Payer: Self-pay

## 2020-02-28 DIAGNOSIS — F332 Major depressive disorder, recurrent severe without psychotic features: Secondary | ICD-10-CM

## 2020-02-28 MED ORDER — BUSPIRONE HCL 5 MG PO TABS: 5.0000 mg | ORAL_TABLET | Freq: Three times a day (TID) | ORAL | 2 refills | Status: DC | PRN

## 2020-02-28 NOTE — Patient Instructions (Addendum)
Health Maintenance Due  Topic Date Due  . COVID-19 Vaccine (1) will send message with dates  Never done  . INFLUENZA VACCINE we do not have in office now - consider next visit 02/27/2020   Considered medicine like lexapro but with your prior history on zoloft I understand your concern and wanting to hold off.   Lets try buspirone instead up to 3x a day for anxiety- may want to just take three times a day with how high your anxiety has been  Sign release of information at the check out desk for records from cardiology for labs within last 6 months. If we cannot get those within 2-3 weeks we may have you come back for labs or simply do labs at your 1 month check in  If you have thoughts of self harm contact us or 911 immediately.   Recommended follow up: Return in about 1 month (around 03/30/2020) for follow up- or sooner if needed.

## 2020-02-28 NOTE — Assessment & Plan Note (Signed)
#   Depression/relational stress S: Patient had a video visit with Dr. Maudie Mercury on February 17, 2020 and states he feels slightly better since that time.  Patient reports has been having some relational issues with his girlfriend.  They are only talking by text right now-they have been together for 28 years.  Patient's girlfriend has a Daughter in 38s and 2 grandboys in 46s- feels lik enot much room for him.  He is suffering from loneliness/depressed mood primarily.  Denies any thoughts of self-harm.  He also deeply misses his father that he care for previously before his death  He feels he has some support with his pastor, daughter, brother.  Church is small 12-15 people there and does not have much support there-other than meets with preacher some.   Does not currently see therapist or counseling is not interested in that at this time.  Dr. Maudie Mercury strongly encourage counseling but patient declined at visit today  He also reports a lot of anxiety-asks if something like Xanax would be helpful. In the past reports zoloft caused thoughts of self harm like jumping off a bridge- made skin crawl. Never attempted suicide.  Depression screen [facility] 2/9 02/28/2020 03/15/2019 11/10/2018  Decreased Interest 3 0 0  Down, Depressed, Hopeless 3 0 1  PHQ - 2 Score 6 0 1  Altered sleeping 3 - 0  Tired, decreased energy 3 - 1  Change in appetite 3 - 0  Feeling bad or failure about yourself  3 - 1  Trouble concentrating 3 - 0  Moving slowly or fidgety/restless 0 - 0  Suicidal thoughts 1 - 0  PHQ-9 Score 22 - 3  Difficult doing work/chores - - Not difficult at all  A/P: 69 year old male with depressed mood/situational stress/relational stress who did not tolerate a prior SSRI trial of Zoloft reporting wanting to crawl out of his skin and jump off a bridge. -Patient asks about Xanax but I told him this would primarily help with anxiety and is a high risk medication overall -Discussed other SSRIs but he is hesitant due to prior  sensation -Discussed adding buspirone 5 mg 3 times a day to help with anxiety and may have a mild benefit for depression-she would like to start with that -1 month follow-up -I also discussed some resources about depression I thought might be helpful for him in relation to his spiritual believes.

## 2020-02-28 NOTE — Progress Notes (Signed)
Phone [phone] In person visit   Subjective:   William Park is a 69 y.o. year old very pleasant male patient who presents for/with See problem oriented charting Chief Complaint  Patient presents with  . Depression   This visit occurred during the SARS-CoV-2 public health emergency.  Safety protocols were in place, including screening questions prior to the visit, additional usage of staff PPE, and extensive cleaning of exam room while observing appropriate contact time as indicated for disinfecting solutions.   Past Medical History-  Patient Active Problem List   Diagnosis Date Noted  . CAD (coronary artery disease)     Priority: High  . Major depressive disorder with single episode, in full remission ([place]) 05/12/2018    Priority: Medium  . Hyperlipidemia     Priority: Medium  . Hypertension     Priority: Medium  . GERD (gastroesophageal reflux disease)     Priority: Medium  . Osteoarthritis     Priority: Medium  . Former smoker 01/06/2015    Priority: Low  . Allergic rhinitis     Priority: Low  . Peroneal tendinitis, right 05/18/2018  . Anemia 08/13/2016    Medications- reviewed and updated Current Outpatient Medications  Medication Sig Dispense Refill  . aspirin 81 MG tablet Take 81 mg by mouth daily.    . famotidine (PEPCID) 20 MG tablet TAKE 1 TABLET BY MOUTH TWICE DAILY 180 tablet 0  . fluticasone (FLONASE) 50 MCG/ACT nasal spray SPRAY 2 SPRAYS INTO EACH NOSTRIL ONCE DAILY 48 g 0  . losartan (COZAAR) 50 MG tablet TAKE 2 TABLETS BY MOUTH ONCE DAILY. 180 tablet 0  . Multiple Vitamin (MULTIVITAMIN) tablet Take 1 tablet by mouth daily.    Marland Kitchen omeprazole (PRILOSEC) 40 MG capsule Take 1 capsule (40 mg total) by mouth daily. 30 minutes before breakfast 90 capsule 3  . simvastatin (ZOCOR) 80 MG tablet TAKE 1 TABLET BY MOUTH ONCE DAILY. 90 tablet 0   No current facility-administered medications for this visit.     Objective:  BP 120/62   Pulse 70   Temp  98.4 F (36.9 C) (Temporal)   Ht 5\' 7"  (1.702 m)   Wt 203 lb (92.1 kg)   SpO2 97%   BMI 31.79 kg/m  Gen: NAD, resting comfortably visibly upset at times during visit    Assessment and Plan   # Depression/relational stress S: Patient had a video visit with Dr. Maudie Mercury on February 17, 2020 and states he feels slightly better since that time.  Patient reports has been having some relational issues with his girlfriend.  They are only talking by text right now-they have been together for 28 years.  Patient's girlfriend has a Daughter in 60s and 2 grandboys in 55s- feels lik enot much room for him.  He is suffering from loneliness/depressed mood primarily.  Denies any thoughts of self-harm.  He also deeply misses his father that he care for previously before his death  He feels he has some support with his pastor, daughter, brother.  Church is small 12-15 people there and does not have much support there-other than meets with preacher some.   Does not currently see therapist or counseling is not interested in that at this time.  Dr. Maudie Mercury strongly encourage counseling but patient declined at visit today  He also reports a lot of anxiety-asks if something like Xanax would be helpful. In the past reports zoloft caused thoughts of self harm like jumping off a bridge- made skin  crawl. Never attempted suicide.  Depression screen [facility] 2/9 02/28/2020 03/15/2019 11/10/2018  Decreased Interest 3 0 0  Down, Depressed, Hopeless 3 0 1  PHQ - 2 Score 6 0 1  Altered sleeping 3 - 0  Tired, decreased energy 3 - 1  Change in appetite 3 - 0  Feeling bad or failure about yourself  3 - 1  Trouble concentrating 3 - 0  Moving slowly or fidgety/restless 0 - 0  Suicidal thoughts 1 - 0  PHQ-9 Score 22 - 3  Difficult doing work/chores - - Not difficult at all  A/P: 69 year old male with depressed mood/situational stress/relational stress who did not tolerate a prior SSRI trial of Zoloft reporting wanting to crawl out of his  skin and jump off a bridge. -Patient asks about Xanax but I told him this would primarily help with anxiety and is a high risk medication overall -Discussed other SSRIs but he is hesitant due to prior sensation -Discussed adding buspirone 5 mg 3 times a day to help with anxiety and may have a mild benefit for depression-she would like to start with that -1 month follow-up -I also discussed some resources about depression I thought might be helpful for him in relation to his spiritual believes. -He declines counseling for now though I think this could be very helpful.  At next visit I may also talk about hospice grief counseling-could have some unresolved grief  We discussed possibly doing labs like CBC, CMP, TSH-he recently had labs with cardiology and we will try to get a copy of those.  If we do not get a copy we will consider blood work at next visit  Recommended follow up: Return in about 1 month (around 03/30/2020) for follow up- or sooner if needed. Future Appointments  Date Time Provider [place]  04/10/2020  2:20 PM Marin Olp, MD [facility]   Lab/Order associations:   ICD-10-CM   1. Severe episode of recurrent major depressive disorder, without psychotic features ([place])  F33.2     Meds ordered this encounter  Medications  . busPIRone (BUSPAR) 5 MG tablet    Sig: Take 1 tablet (5 mg total) by mouth 3 (three) times daily as needed.    Dispense:  90 tablet    Refill:  2    Time Spent: 35 minutes of total time (455 PM-5:30 PM ) was spent on the date of the encounter performing the following actions: chart review prior to seeing the patient, obtaining history, performing a medically necessary exam, counseling on the treatment plan, placing orders, and documenting in our EHR.   Return precautions advised.  Garret Reddish, MD

## 2020-03-28 ENCOUNTER — Other Ambulatory Visit: Payer: Self-pay | Admitting: Family Medicine

## 2020-04-10 ENCOUNTER — Other Ambulatory Visit: Payer: Self-pay

## 2020-04-10 ENCOUNTER — Ambulatory Visit (INDEPENDENT_AMBULATORY_CARE_PROVIDER_SITE_OTHER): Payer: Medicare Other

## 2020-04-10 ENCOUNTER — Ambulatory Visit (INDEPENDENT_AMBULATORY_CARE_PROVIDER_SITE_OTHER): Payer: Medicare Other | Admitting: Family Medicine

## 2020-04-10 ENCOUNTER — Encounter: Payer: Self-pay | Admitting: Family Medicine

## 2020-04-10 VITALS — BP 122/78 | HR 70 | Temp 98.4°F | Resp 18 | Ht 67.0 in | Wt 203.8 lb

## 2020-04-10 VITALS — Wt 203.0 lb

## 2020-04-10 DIAGNOSIS — E785 Hyperlipidemia, unspecified: Secondary | ICD-10-CM

## 2020-04-10 DIAGNOSIS — I2510 Atherosclerotic heart disease of native coronary artery without angina pectoris: Secondary | ICD-10-CM

## 2020-04-10 DIAGNOSIS — F332 Major depressive disorder, recurrent severe without psychotic features: Secondary | ICD-10-CM

## 2020-04-10 DIAGNOSIS — Z0000 Encounter for general adult medical examination without abnormal findings: Secondary | ICD-10-CM

## 2020-04-10 DIAGNOSIS — I10 Essential (primary) hypertension: Secondary | ICD-10-CM | POA: Diagnosis not present

## 2020-04-10 DIAGNOSIS — Z23 Encounter for immunization: Secondary | ICD-10-CM | POA: Diagnosis not present

## 2020-04-10 MED ORDER — LOSARTAN POTASSIUM 50 MG PO TABS
50.0000 mg | ORAL_TABLET | Freq: Every day | ORAL | 3 refills | Status: DC
Start: 2020-04-10 — End: 2021-04-09

## 2020-04-10 MED ORDER — SILDENAFIL CITRATE 100 MG PO TABS: 100.0000 mg | ORAL_TABLET | Freq: Every day | ORAL | 11 refills | Status: DC | PRN

## 2020-04-10 NOTE — Progress Notes (Signed)
Virtual Visit via Telephone Note  I connected with  William Park on 04/10/20 at  8:45 AM EDT by telephone and verified that I am speaking with the correct person using two identifiers.  Medicare Annual Wellness visit completed telephonically due to Covid-19 pandemic.   Persons participating in this call: This Health Coach and this patient.  Location: Patient: Home Provider: Office   I discussed the limitations, risks, security and privacy concerns of performing an evaluation and management service by telephone and the availability of in person appointments. The patient expressed understanding and agreed to proceed.  Unable to perform video visit due to video visit attempted and failed and/or patient does not have video capability.   Some vital signs may be absent or patient reported.   Willette Brace, LPN    Subjective:   William Park is a 69 y.o. male who presents for Medicare Annual/Subsequent preventive examination.  Review of Systems     Cardiac Risk Factors include: dyslipidemia;hypertension;male gender;obesity (BMI >30kg/m2)     Objective:    Today's Vitals   04/10/20 0846  Weight: 203 lb (92.1 kg)   Body mass index is 31.79 kg/m.  Advanced Directives 04/10/2020 03/15/2019 12/09/2017  Does Patient Have a Medical Advance Directive? Yes Yes Yes  Type of Paramedic of [place];Living will [place];Living will [place];Living will  Does patient want to make changes to medical advance directive? - No - Patient declined No - Patient declined  Copy of [place] in Chart? No - copy requested No - copy requested No - copy requested    Current Medications (verified) Outpatient Encounter Medications as of 04/10/2020  Medication Sig  . aspirin 81 MG tablet Take 81 mg by mouth daily.  . busPIRone (BUSPAR) 5 MG tablet Take 1 tablet (5 mg total) by mouth 3 (three) times daily as  needed.  . famotidine (PEPCID) 20 MG tablet TAKE 1 TABLET BY MOUTH TWICE DAILY  . losartan (COZAAR) 50 MG tablet TAKE 2 TABLETS BY MOUTH ONCE DAILY.  . Multiple Vitamin (MULTIVITAMIN) tablet Take 1 tablet by mouth daily.  Marland Kitchen omeprazole (PRILOSEC) 40 MG capsule Take 1 capsule (40 mg total) by mouth daily. 30 minutes before breakfast  . simvastatin (ZOCOR) 80 MG tablet TAKE 1 TABLET BY MOUTH ONCE DAILY.  . fluticasone (FLONASE) 50 MCG/ACT nasal spray SPRAY 2 SPRAYS INTO EACH NOSTRIL ONCE DAILY   No facility-administered encounter medications on file as of 04/10/2020.    Allergies (verified) Patient has no known allergies.   History: Past Medical History:  Diagnosis Date  . Allergic rhinitis    allegra and flonase  . CAD (coronary artery disease)    Dr. [name] s/p 4 angioplasty's with last in 97. ASA. simvastatin 80mg   . GERD (gastroesophageal reflux disease)    nexium 40mg  and zantac BID-mout fills with saliva and coughing. Seeing GI in [facility] 12/2014  . Hyperlipidemia    simvastatin 80mg , niaspan 750mg   . Hypertension    losartan 50mg   . Myocardial infarction ([place])    "small MI, mild"- 1994  . Osteoarthritis    mainly hands. Mobic 15mg    Past Surgical History:  Procedure Laterality Date  . ANGIOPLASTY     x4  . COLONOSCOPY    . EYE SURGERY    . pyloric stenosis     as a child  . UPPER GASTROINTESTINAL ENDOSCOPY     Family History  Problem Relation Age  of Onset  . Dementia Father        passed age 49  . Kidney disease Father        CKD  . CAD Father   . AAA (abdominal aortic aneurysm) Father   . Breast cancer Mother   . Congestive Heart Failure Mother        23 time of death  . CAD Brother   . Colon cancer Neg Hx   . Esophageal cancer Neg Hx   . Stomach cancer Neg Hx   . Rectal cancer Neg Hx    Social History   Socioeconomic History  . Marital status: Divorced    Spouse name: Not on file  . Number of children: Not on file  . Years of education: Not on file   . Highest education level: Not on file  Occupational History  . Not on file  Tobacco Use  . Smoking status: Former Smoker    Packs/day: 2.00    Years: 25.00    Pack years: 50.00    Types: Cigarettes    Quit date: 07/30/1987    Years since quitting: 32.7  . Smokeless tobacco: Never Used  Vaping Use  . Vaping Use: Never used  Substance and Sexual Activity  . Alcohol use: No    Alcohol/week: 0.0 standard drinks  . Drug use: No  . Sexual activity: Not on file  Other Topics Concern  . Not on file  Social History Narrative   Family: Divorced. 2 daughters. 3 granddaughters. Doesn't get to see them.       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 Difficulty of Paying Living Expenses: Not hard at all  Food Insecurity: No Food Insecurity  . Worried About Charity fundraiser in the Last Year: Never true  . Ran Out of Food in the Last Year: Never true  Transportation Needs: No Transportation Needs  . Lack of Transportation (Medical): No  . Lack of Transportation (Non-Medical): No  Physical Activity: Inactive  . Days of Exercise per Week: 0 days  . Minutes of Exercise per Session: 0 min  Stress: No Stress Concern Present  . Feeling of Stress : Only a little  Social Connections: Moderately Isolated  . Frequency of Communication with Friends and Family: More than three times a week  . Frequency of Social Gatherings with Friends and Family: More than three times a week  . Attends Religious Services: More than 4 times per year  . Active Member of Clubs or Organizations: No  . Attends Archivist Meetings: Never  . Marital Status: Divorced    Tobacco Counseling Counseling given: Not Answered   Clinical Intake:  Pre-visit preparation completed: Yes  Pain : No/denies pain     BMI - recorded: 31.79 Nutritional Status: BMI > 30  Obese  How often  do you need to have someone help you when you read instructions, pamphlets, or other written materials from your doctor or pharmacy?: 1 - Never  Diabetic?No  Interpreter Needed?: No  Information entered by :: Charlott Rakes, LPN   Activities of Daily Living In your present state of health, do you have any difficulty performing the following activities: 04/10/2020  Hearing? N  Vision? N  Difficulty concentrating or making decisions? N  Walking or climbing stairs? N  Dressing or bathing? N  Doing errands, shopping? N  Preparing Food and eating ? N  Using the Toilet? N  Do you have problems with loss of bowel control? N  Managing your Medications? N  Managing your Finances? N  Housekeeping or managing your Housekeeping? N  Some recent data might be hidden    Patient Care Team: Marin Olp, MD as PCP - General (Family Medicine) Delanna Notice, MD as Consulting Physician (Cardiology)  Indicate any recent Medical Services you may have received from other than [facility] providers in the past year (date may be approximate).     Assessment:   This is a routine wellness examination for Aut[place].  Hearing/Vision screen  Hearing Screening   125Hz  250Hz  500Hz  1000Hz  2000Hz  3000Hz  4000Hz  6000Hz  8000Hz   Right ear:           Left ear:           Comments: Pt states he has been told he has but he feels fine  Vision Screening Comments: Follows up with annually with Dr Graciela Husbands  Dietary issues and exercise activities discussed: Current Exercise Habits: The patient does not participate in regular exercise at present ([place] works a full time trucking Job), Exercise limited by: None identified  Goals    .  Patient Stated (pt-stated)      Maintain current state of health and functional ability     .  Patient Stated      Stay healthy      Depression Screen PHQ 2/9 Scores 04/10/2020 02/28/2020 03/15/2019 11/10/2018 12/09/2017 11/10/2017 05/23/2016  PHQ - 2 Score 1 6 0 1 0 0 0  PHQ- 9  Score - 22 - 3 0 - -    Fall Risk Fall Risk  04/10/2020 02/28/2020 03/15/2019 02/26/2019 12/09/2017  Falls in the past year? 0 0 0 1 No  Comment - - - Emmi Telephone Survey: data to providers prior to load -  Number falls in past yr: 0 0 0 1 -  Comment - - - Emmi Telephone Survey Actual Response = 9 -  Injury with Fall? 0 0 0 0 -  Risk for fall due to : Impaired vision - - - -  Risk for fall due to: Comment just readers - - - -  Follow up Falls prevention discussed - Education provided - -     Home free of loose throw rugs in walkways, pet beds, electrical cords, etc? Yes  Adequate lighting in your home to reduce risk of falls? Yes   ASSISTIVE DEVICES UTILIZED TO PREVENT FALLS:  Life alert? No  Use of a cane, walker or w/c? No    TIMED UP AND GO:  Was the test performed? No .     Cognitive Function:     6CIT Screen 04/10/2020  What Year? 0 points  What month? 0 points  Count back from 20 0 points  Months in reverse 0 points  Repeat phrase 0 points    Immunizations Immunization History  Administered Date(s) Administered  . Influenza Whole 04/29/2019  . Influenza, High Dose Seasonal PF 04/07/2017, 05/12/2018  . Influenza,inj,Quad PF,6+ Mos 07/21/2015  . Influenza-Unspecified 07/17/2016  . Moderna SARS-COVID-2 Vaccination 11/22/2019, 12/20/2019  . Pneumococcal Conjugate-13 07/21/2015  . Pneumococcal Polysaccharide-23 03/10/2017  . Tdap 01/26/2016    TDAP status: Up to date Flu Vaccine status: Up to date Pneumococcal vaccine status: Up to date Covid-19 vaccine status: Completed vaccines  Qualifies for Shingles Vaccine? Yes   Zostavax completed No   Shingrix Completed?:  No.    Education has been provided regarding the importance of this vaccine. Patient has been advised to call insurance company to determine out of pocket expense if they have not yet received this vaccine. Advised may also receive vaccine at local pharmacy or Health Dept. Verbalized acceptance and  understanding.  Screening Tests Health Maintenance  Topic Date Due  . INFLUENZA VACCINE  02/27/2020  . Fecal DNA (Cologuard)  03/16/2020  . Hepatitis C Screening  07/25/2098 (Originally Apr 27, 1951)  . TETANUS/TDAP  01/25/2026  . COVID-19 Vaccine  Completed  . PNA vac Low Risk Adult  Completed    Health Maintenance  Health Maintenance Due  Topic Date Due  . INFLUENZA VACCINE  02/27/2020  . Fecal DNA (Cologuard)  03/16/2020    Colorectal cancer screening: Completed 07/06/19. Repeat every 10 years  Additional Screening:  Hepatitis C Screening: does qualify  Vision Screening: Recommended annual ophthalmology exams for early detection of glaucoma and other disorders of the eye. Is the patient up to date with their annual eye exam?  Yes  Who is the provider or what is the name of the office in which the patient attends annual eye exams? Dr Graciela Husbands   Dental Screening: Recommended annual dental exams for proper oral hygiene  Community Resource Referral / Chronic Care Management: CRR required this visit?  No   CCM required this visit?  No      Plan:     I have personally reviewed and noted the following in the patient's chart:   . Medical and social history . Use of alcohol, tobacco or illicit drugs  . Current medications and supplements . Functional ability and status . Nutritional status . Physical activity . Advanced directives . List of other physicians . Hospitalizations, surgeries, and ER visits in previous 12 months . Vitals . Screenings to include cognitive, depression, and falls . Referrals and appointments  In addition, I have reviewed and discussed with patient certain preventive protocols, quality metrics, and best practice recommendations. A written personalized care plan for preventive services as well as general preventive health recommendations were provided to patient.     Willette Brace, LPN   1/70/0174   Nurse Notes: Pt wants to get flu  shot today at visit in office 04/10/20.

## 2020-04-10 NOTE — Patient Instructions (Addendum)
William Park , Thank you for taking time to come for your Medicare Wellness Visit. I appreciate your ongoing commitment to your health goals. Please review the following plan we discussed and let me know if I can assist you in the future.   Screening recommendations/referrals: Colonoscopy: Done 12/8//20 Recommended yearly ophthalmology/optometry visit for glaucoma screening and checkup Recommended yearly dental visit for hygiene and checkup  Vaccinations: Influenza vaccine: Up to date Pneumococcal vaccine: Up to date Tdap vaccine: Up to date Shingles vaccine: Shingrix discussed. Please contact your pharmacy for coverage information.    Covid-19: Completed 4/26 & 12/20/19  Advanced directives: Please bring a copy of your health care power of attorney and living will to the office at your convenience.  Conditions/risks identified: Stay Healthy  Next appointment: Follow up in one year for your annual wellness visit.   Preventive Care 14 Years and Older, Male Preventive care refers to lifestyle choices and visits with your health care provider that can promote health and wellness. What does preventive care include?  A yearly physical exam. This is also called an annual well check.  Dental exams once or twice a year.  Routine eye exams. Ask your health care provider how often you should have your eyes checked.  Personal lifestyle choices, including:  Daily care of your teeth and gums.  Regular physical activity.  Eating a healthy diet.  Avoiding tobacco and drug use.  Limiting alcohol use.  Practicing safe sex.  Taking low doses of aspirin every day.  Taking vitamin and mineral supplements as recommended by your health care provider. What happens during an annual well check? The services and screenings done by your health care provider during your annual well check will depend on your age, overall health, lifestyle risk factors, and family history of disease. Counseling   Your health care provider may ask you questions about your:  Alcohol use.  Tobacco use.  Drug use.  Emotional well-being.  Home and relationship well-being.  Sexual activity.  Eating habits.  History of falls.  Memory and ability to understand (cognition).  Work and work Statistician. Screening  You may have the following tests or measurements:  Height, weight, and BMI.  Blood pressure.  Lipid and cholesterol levels. These may be checked every 5 years, or more frequently if you are over 39 years old.  Skin check.  Lung cancer screening. You may have this screening every year starting at age 49 if you have a 30-pack-year history of smoking and currently smoke or have quit within the past 15 years.  Fecal occult blood test (FOBT) of the stool. You may have this test every year starting at age 51.  Flexible sigmoidoscopy or colonoscopy. You may have a sigmoidoscopy every 5 years or a colonoscopy every 10 years starting at age 12.  Prostate cancer screening. Recommendations will vary depending on your family history and other risks.  Hepatitis C blood test.  Hepatitis B blood test.  Sexually transmitted disease (STD) testing.  Diabetes screening. This is done by checking your blood sugar (glucose) after you have not eaten for a while (fasting). You may have this done every 1-3 years.  Abdominal aortic aneurysm (AAA) screening. You may need this if you are a current or former smoker.  Osteoporosis. You may be screened starting at age 4 if you are at high risk. Talk with your health care provider about your test results, treatment options, and if necessary, the need for more tests. Vaccines  Your health  care provider may recommend certain vaccines, such as:  Influenza vaccine. This is recommended every year.  Tetanus, diphtheria, and acellular pertussis (Tdap, Td) vaccine. You may need a Td booster every 10 years.  Zoster vaccine. You may need this after age  81.  Pneumococcal 13-valent conjugate (PCV13) vaccine. One dose is recommended after age 1.  Pneumococcal polysaccharide (PPSV23) vaccine. One dose is recommended after age 45. Talk to your health care provider about which screenings and vaccines you need and how often you need them. This information is not intended to replace advice given to you by your health care provider. Make sure you discuss any questions you have with your health care provider. Document Released: 08/11/2015 Document Revised: 04/03/2016 Document Reviewed: 05/16/2015 Elsevier Interactive Patient Education  2017 [place] Prevention in the Home Falls can cause injuries. They can happen to people of all ages. There are many things you can do to make your home safe and to help prevent falls. What can I do on the outside of my home?  Regularly fix the edges of walkways and driveways and fix any cracks.  Remove anything that might make you trip as you walk through a door, such as a raised step or threshold.  Trim any bushes or trees on the path to your home.  Use bright outdoor lighting.  Clear any walking paths of anything that might make someone trip, such as rocks or tools.  Regularly check to see if handrails are loose or broken. Make sure that both sides of any steps have handrails.  Any raised decks and porches should have guardrails on the edges.  Have any leaves, snow, or ice cleared regularly.  Use sand or salt on walking paths during winter.  Clean up any spills in your garage right away. This includes oil or grease spills. What can I do in the bathroom?  Use night lights.  Install grab bars by the toilet and in the tub and shower. Do not use towel bars as grab bars.  Use non-skid mats or decals in the tub or shower.  If you need to sit down in the shower, use a plastic, non-slip stool.  Keep the floor dry. Clean up any water that spills on the floor as soon as it happens.  Remove  soap buildup in the tub or shower regularly.  Attach bath mats securely with double-sided non-slip rug tape.  Do not have throw rugs and other things on the floor that can make you trip. What can I do in the bedroom?  Use night lights.  Make sure that you have a light by your bed that is easy to reach.  Do not use any sheets or blankets that are too big for your bed. They should not hang down onto the floor.  Have a firm chair that has side arms. You can use this for support while you get dressed.  Do not have throw rugs and other things on the floor that can make you trip. What can I do in the kitchen?  Clean up any spills right away.  Avoid walking on wet floors.  Keep items that you use a lot in easy-to-reach places.  If you need to reach something above you, use a strong step stool that has a grab bar.  Keep electrical cords out of the way.  Do not use floor polish or wax that makes floors slippery. If you must use wax, use non-skid floor wax.  Do not have  throw rugs and other things on the floor that can make you trip. What can I do with my stairs?  Do not leave any items on the stairs.  Make sure that there are handrails on both sides of the stairs and use them. Fix handrails that are broken or loose. Make sure that handrails are as long as the stairways.  Check any carpeting to make sure that it is firmly attached to the stairs. Fix any carpet that is loose or worn.  Avoid having throw rugs at the top or bottom of the stairs. If you do have throw rugs, attach them to the floor with carpet tape.  Make sure that you have a light switch at the top of the stairs and the bottom of the stairs. If you do not have them, ask someone to add them for you. What else can I do to help prevent falls?  Wear shoes that:  Do not have high heels.  Have rubber bottoms.  Are comfortable and fit you well.  Are closed at the toe. Do not wear sandals.  If you use a  stepladder:  Make sure that it is fully opened. Do not climb a closed stepladder.  Make sure that both sides of the stepladder are locked into place.  Ask someone to hold it for you, if possible.  Clearly mark and make sure that you can see:  Any grab bars or handrails.  First and last steps.  Where the edge of each step is.  Use tools that help you move around (mobility aids) if they are needed. These include:  Canes.  Walkers.  Scooters.  Crutches.  Turn on the lights when you go into a dark area. Replace any light bulbs as soon as they burn out.  Set up your furniture so you have a clear path. Avoid moving your furniture around.  If any of your floors are uneven, fix them.  If there are any pets around you, be aware of where they are.  Review your medicines with your doctor. Some medicines can make you feel dizzy. This can increase your chance of falling. Ask your doctor what other things that you can do to help prevent falls. This information is not intended to replace advice given to you by your health care provider. Make sure you discuss any questions you have with your health care provider. Document Released: 05/11/2009 Document Revised: 12/21/2015 Document Reviewed: 08/19/2014 Elsevier Interactive Patient Education  2017 [employer].

## 2020-04-10 NOTE — Addendum Note (Signed)
Addended by: Thomes Cake on: 04/10/2020 03:09 PM   Modules accepted: Orders

## 2020-04-10 NOTE — Patient Instructions (Addendum)
Health Maintenance Due  Topic Date Due  . INFLUENZA VACCINE- high dose flu shot today. 02/27/2020   viagra trial good rx card given  Continue buspirone up to 3x a day. If depression worsens let me know. I still think counseling would be great if you change your mind or feel you need more support.   Please call [phone] to schedule a visit with [facility] -Trey Paula is an excellent counselor who is based out of our clinic  Glad blood pressure looks better- continue losartan 50mg   Recommended follow up: Return in about 6 months (around 10/08/2020) for follow up- or sooner if needed.

## 2020-04-10 NOTE — Progress Notes (Signed)
Phone [phone] In person visit   Subjective:   William Park is a 69 y.o. year old very pleasant male patient who presents for/with See problem oriented charting Chief Complaint  Patient presents with  . Depression   This visit occurred during the SARS-CoV-2 public health emergency.  Safety protocols were in place, including screening questions prior to the visit, additional usage of staff PPE, and extensive cleaning of exam room while observing appropriate contact time as indicated for disinfecting solutions.   Past Medical History-  Patient Active Problem List   Diagnosis Date Noted  . CAD (coronary artery disease)     Priority: High  . Major depressive disorder with single episode, in full remission ([place]) 05/12/2018    Priority: Medium  . Hyperlipidemia     Priority: Medium  . Hypertension     Priority: Medium  . GERD (gastroesophageal reflux disease)     Priority: Medium  . Osteoarthritis     Priority: Medium  . Former smoker 01/06/2015    Priority: Low  . Allergic rhinitis     Priority: Low  . Peroneal tendinitis, right 05/18/2018  . Anemia 08/13/2016    Medications- reviewed and updated Current Outpatient Medications  Medication Sig Dispense Refill  . aspirin 81 MG tablet Take 81 mg by mouth daily.    . busPIRone (BUSPAR) 5 MG tablet Take 1 tablet (5 mg total) by mouth 3 (three) times daily as needed. 90 tablet 2  . famotidine (PEPCID) 20 MG tablet TAKE 1 TABLET BY MOUTH TWICE DAILY 180 tablet 0  . fluticasone (FLONASE) 50 MCG/ACT nasal spray SPRAY 2 SPRAYS INTO EACH NOSTRIL ONCE DAILY 48 g 0  . losartan (COZAAR) 50 MG tablet TAKE 2 TABLETS BY MOUTH ONCE DAILY. (Patient taking differently: Take 50 mg by mouth daily. ) 180 tablet 0  . Multiple Vitamin (MULTIVITAMIN) tablet Take 1 tablet by mouth daily.    Marland Kitchen omeprazole (PRILOSEC) 40 MG capsule Take 1 capsule (40 mg total) by mouth daily. 30 minutes before breakfast 90 capsule 3  . simvastatin (ZOCOR) 80  MG tablet TAKE 1 TABLET BY MOUTH ONCE DAILY. 90 tablet 0      Objective:  BP 122/78   Pulse 70   Temp 98.4 F (36.9 C) (Temporal)   Resp 18   Ht 5\' 7"  (1.702 m)   Wt 203 lb 12.8 oz (92.4 kg)   SpO2 98%   BMI 31.92 kg/m  Gen: NAD, resting comfortably CV: RRR no murmurs rubs or gallops Lungs: CTAB no crackles, wheeze, rhonchi Ext: no edema Skin: warm, dry     Assessment and Plan  #Depression/relational stress S: Patient stated that his depression has gotten better. The medication has really helped him. He is taking Buspar/buspirone 5mg  3 times a day. Takes 0-3 per day- just depends on the day.   Issues with his girlfriend are much better and that's helpful as well. Also felt like he had to somewhat release situation with his daughters to prayer and that's helpful.  Depression screen [facility] 2/9 04/10/2020 04/10/2020 02/28/2020  Decreased Interest 0 0 3  Down, Depressed, Hopeless 1 1 3   PHQ - 2 Score 1 1 6   Altered sleeping 2 - 3  Tired, decreased energy 1 - 3  Change in appetite 1 - 3  Feeling bad or failure about yourself  1 - 3  Trouble concentrating 1 - 3  Moving slowly or fidgety/restless 1 - 0  Suicidal thoughts 1 - 1  PHQ-9  Score 9 - 22  Difficult doing work/chores - - -  A/P: Depression with significant improvement with PHQ-9 down from 22 to 9 today.  He has not tolerated SSRIs in the past-felt like he was crawling out of his skin on Zoloft so unfortunately we cannot use those medications.  Cautious about using Wellbutrin as anxiety plays a role in his symptoms.  We opted to continue current medications.  He declines counseling for now.  If he has more pervasive thoughts of self-harm he knows to call us or call 911 immediately-preferably 911  See last note as well  # erectile dysfunction S: Patient is still able to get an erection but firmness is not as much as he would like.  He has taken Levitra in the past-did get mild headache with this but would like to retry something  similar A/P: Poor control of erectile dysfunction-I sent in Viagra for him to trial at 100 mg but I want him to start with just 50 mg.  Discussed potential for symptoms like headaches like he had with Levitra.  He knows he cannot take nitroglycerin if he has taken Viagra within 24 hours.  He will shop for this through [facility] to see if more cost effective  #CAD #hyperlipidemia S: Medication:simvastatin 80mg  and aspirin 81mg . No chest pain or shorntess of breath  Lab Results  Component Value Date   CHOL 134 08/24/2018   HDL 49 08/24/2018   LDLCALC 51 08/24/2018   TRIG 170 (A) 08/24/2018   CHOLHDL 3 07/31/2016   A/P: Patient tried to get records sent to [place] from his cardiologist-they reported sending these but unfortunately have not been able to locate them.  We opted to update blood work today including lipid, TSH, CBC, CMP.  For coronary artery disease-asymptomatic-continue current medications  Hyperlipidemia obviously we prefer LDL under 70 and it was there last January-update lipids today  #hypertension S: medication: losartan 50mg  (listed as take 2 but hes only taking one) Home readings #s: good control at home BP Readings from Last 3 Encounters:  04/10/20 122/78  02/28/20 120/62  07/06/19 117/67  A/P: well controlled- despite going back to 50mg - continue current meds  Recommended follow up: Return in about 6 months (around 10/08/2020) for follow up- or sooner if needed. Future Appointments  Date Time Provider [place]  04/12/2021  8:45 AM [facility]    Lab/Order associations:   ICD-10-CM   1. Severe episode of recurrent major depressive disorder, without psychotic features ([place])  F33.2 CBC With Differential/Platelet    COMPLETE METABOLIC PANEL WITH GFR    TSH  2. Hyperlipidemia, unspecified hyperlipidemia type  E78.5 CBC With Differential/Platelet    COMPLETE METABOLIC PANEL WITH GFR    Lipid Panel (Refl)    TSH  3. Coronary artery disease  involving native coronary artery of native heart without angina pectoris  I25.10     Meds ordered this encounter  Medications  . sildenafil (VIAGRA) 100 MG tablet    Sig: Take 1 tablet (100 mg total) by mouth daily as needed for erectile dysfunction.    Dispense:  100 tablet    Refill:  11  . losartan (COZAAR) 50 MG tablet    Sig: Take 1 tablet (50 mg total) by mouth daily.    Dispense:  90 tablet    Refill:  3   Return precautions advised.  Garret Reddish, MD

## 2020-04-10 NOTE — Addendum Note (Signed)
Addended by: Milton Ferguson D on: 04/10/2020 03:16 PM   Modules accepted: Orders

## 2020-04-11 LAB — COMPLETE METABOLIC PANEL WITH GFR
AG Ratio: 1.6 (calc) (ref 1.0–2.5)
ALT: 15 U/L (ref 9–46)
AST: 17 U/L (ref 10–35)
Albumin: 3.9 g/dL (ref 3.6–5.1)
Alkaline phosphatase (APISO): 65 U/L (ref 35–144)
BUN: 15 mg/dL (ref 7–25)
CO2: 31 mmol/L (ref 20–32)
Calcium: 9 mg/dL (ref 8.6–10.3)
Chloride: 104 mmol/L (ref 98–110)
Creat: 1.03 mg/dL (ref 0.70–1.25)
GFR, Est African American: 85 mL/min/{1.73_m2} (ref >=60)
GFR, Est Non African American: 74 mL/min/{1.73_m2} (ref >=60)
Globulin: 2.4 g/dL (calc) (ref 1.9–3.7)
Glucose, Bld: 79 mg/dL (ref 65–99)
Potassium: 3.9 mmol/L (ref 3.5–5.3)
Sodium: 142 mmol/L (ref 135–146)
Total Bilirubin: 0.8 mg/dL (ref 0.2–1.2)
Total Protein: 6.3 g/dL (ref 6.1–8.1)

## 2020-04-11 LAB — TSH: TSH: 0.93 mIU/L (ref 0.40–4.50)

## 2020-04-11 LAB — CBC WITH DIFFERENTIAL/PLATELET
Absolute Monocytes: 764 cells/uL (ref 200–950)
Basophils Absolute: 39 cells/uL (ref 0–200)
Basophils Relative: 0.5 %
Eosinophils Absolute: 117 cells/uL (ref 15–500)
Eosinophils Relative: 1.5 %
HCT: 40.9 % (ref 38.5–50.0)
Hemoglobin: 14.2 g/dL (ref 13.2–17.1)
Lymphs Abs: 2839 cells/uL (ref 850–3900)
MCH: 34 pg — ABNORMAL HIGH (ref 27.0–33.0)
MCHC: 34.7 g/dL (ref 32.0–36.0)
MCV: 97.8 fL (ref 80.0–100.0)
MPV: 9.7 fL (ref 7.5–12.5)
Monocytes Relative: 9.8 %
Neutro Abs: 4040 cells/uL (ref 1500–7800)
Neutrophils Relative %: 51.8 %
Platelets: 147 10*3/uL (ref 140–400)
RBC: 4.18 10*6/uL — ABNORMAL LOW (ref 4.20–5.80)
RDW: 12.8 % (ref 11.0–15.0)
Total Lymphocyte: 36.4 %
WBC: 7.8 10*3/uL (ref 3.8–10.8)

## 2020-04-11 LAB — LIPID PANEL (REFL)
Cholesterol: 141 mg/dL (ref <200)
HDL: 52 mg/dL (ref >=40)
LDL Cholesterol (Calc): 62 mg/dL (calc)
Non-HDL Cholesterol (Calc): 89 mg/dL (calc) (ref <130)
Total CHOL/HDL Ratio: 2.7 (calc) (ref <5.0)
Triglycerides: 197 mg/dL — ABNORMAL HIGH (ref <150)

## 2020-04-29 DIAGNOSIS — Z20822 Contact with and (suspected) exposure to covid-19: Secondary | ICD-10-CM | POA: Diagnosis not present

## 2020-04-29 DIAGNOSIS — R059 Cough, unspecified: Secondary | ICD-10-CM | POA: Diagnosis not present

## 2020-05-29 DIAGNOSIS — I10 Essential (primary) hypertension: Secondary | ICD-10-CM | POA: Diagnosis not present

## 2020-05-29 DIAGNOSIS — Z125 Encounter for screening for malignant neoplasm of prostate: Secondary | ICD-10-CM | POA: Diagnosis not present

## 2020-05-29 DIAGNOSIS — I2510 Atherosclerotic heart disease of native coronary artery without angina pectoris: Secondary | ICD-10-CM | POA: Diagnosis not present

## 2020-05-29 DIAGNOSIS — E785 Hyperlipidemia, unspecified: Secondary | ICD-10-CM | POA: Diagnosis not present

## 2020-05-29 DIAGNOSIS — N289 Disorder of kidney and ureter, unspecified: Secondary | ICD-10-CM | POA: Diagnosis not present

## 2020-06-19 ENCOUNTER — Other Ambulatory Visit: Payer: Self-pay | Admitting: Family Medicine

## 2020-07-10 DIAGNOSIS — H43813 Vitreous degeneration, bilateral: Secondary | ICD-10-CM | POA: Diagnosis not present

## 2020-07-10 DIAGNOSIS — Z961 Presence of intraocular lens: Secondary | ICD-10-CM | POA: Diagnosis not present

## 2020-07-14 ENCOUNTER — Telehealth: Payer: Self-pay | Admitting: Family Medicine

## 2020-07-14 NOTE — Chronic Care Management (AMB) (Signed)
°  Chronic Care Management   Note  07/14/2020 Name: William Park MRN: [id number] DOB: [birth year]  William Park is a 69 y.o. year old male who is a primary care patient of Yong Channel, Brayton Mars, MD. I reached out to William Park by phone today in response to a referral sent by William Park's PCP, Marin Olp, MD.   William Park was given information about Chronic Care Management services today including:  1. CCM service includes personalized support from designated clinical staff supervised by his physician, including individualized plan of care and coordination with other care providers 2. 24/7 contact phone numbers for assistance for urgent and routine care needs. 3. Service will only be billed when office clinical staff spend 20 minutes or more in a month to coordinate care. 4. Only one practitioner may furnish and bill the service in a calendar month. 5. The patient may stop CCM services at any time (effective at the end of the month) by phone call to the office staff.   Patient agreed to services and verbal consent obtained.   Follow up plan:   Lauretta Grill [facility] Scheduler

## 2020-07-17 ENCOUNTER — Other Ambulatory Visit: Payer: Self-pay | Admitting: Family Medicine

## 2020-08-17 ENCOUNTER — Telehealth: Payer: Self-pay

## 2020-08-17 NOTE — Chronic Care Management (AMB) (Signed)
Chronic Care Management Pharmacy Assistant   Name: William Park  MRN: [id number] DOB: [birth year]  Reason for Encounter: Medication Review/ Initial Visit   William Park,  70 y.o. , male presents for their Initial CCM visit with the clinical pharmacist In office.  PCP : Marin Olp, MD  Allergies:  No Known Allergies  Medications: Outpatient Encounter Medications as of 08/17/2020  Medication Sig  . aspirin 81 MG tablet Take 81 mg by mouth daily.  . busPIRone (BUSPAR) 5 MG tablet Take 1 tablet (5 mg total) by mouth 3 (three) times daily as needed.  . famotidine (PEPCID) 20 MG tablet TAKE 1 TABLET BY MOUTH TWICE DAILY  . fluticasone (FLONASE) 50 MCG/ACT nasal spray SPRAY 2 SPRAYS INTO EACH NOSTRIL ONCE DAILY  . losartan (COZAAR) 50 MG tablet Take 1 tablet (50 mg total) by mouth daily.  . Multiple Vitamin (MULTIVITAMIN) tablet Take 1 tablet by mouth daily.  Marland Kitchen omeprazole (PRILOSEC) 40 MG capsule TAKE (1) CAPSULE BY MOUTH ONCE DAILY 30 MINTUES BEFORE BREAKFAST.  . sildenafil (VIAGRA) 100 MG tablet Take 1 tablet (100 mg total) by mouth daily as needed for erectile dysfunction.  . simvastatin (ZOCOR) 80 MG tablet TAKE 1 TABLET BY MOUTH ONCE DAILY.   No facility-administered encounter medications on file as of 08/17/2020.    Current Diagnosis: Patient Active Problem List   Diagnosis Date Noted  . Peroneal tendinitis, right 05/18/2018  . Major depressive disorder with single episode, in full remission ([place]) 05/12/2018  . Anemia 08/13/2016  . Former smoker 01/06/2015  . CAD (coronary artery disease)   . Hyperlipidemia   . Hypertension   . GERD (gastroesophageal reflux disease)   . Osteoarthritis   . Allergic rhinitis     Have you seen any other providers since your last visit?   04/29/2020 Ellison Carwin, Jenny Reichmann, 05/29/2020 [facility] Renee Ramus, MD, no medication changes noted during these visits.  Any changes in your medications or  health?  Patient states he recently started back taking Meloxicam for arthritis in his hands.  Any side effects from any medications?   Patient states he does not currently have any side effects from any of his medications.  Do you have any symptoms or problems not managed by your medications?  Patient states he does not currently have any symptoms or problems not managed by his medications.  Any concerns about your health right now?  Patient states he does not have any concerns about his health at this time.  Has your provider asked that you check blood pressure, blood sugar, or follow special diet at home?  Patient states he does not check his blood sugars at home at all, he states he hasn't ever had any problems with his blood sugars. Patient states he does monitor his blood pressure at home. Patient reports readings at 130/70. Patient states he does not follow any special diet at this time.  Do you get any type of exercise on a regular basis?  Patient states he does not exercise on a regular basis. 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  Can you think of a goal you would like to reach for your health?  Patient states a goal for his health would be to stay healthy.  Do you have any problems getting your medications?  Patient states he does not have any problems getting his medications at this time.  Is there anything that you would like to discuss during the appointment?   Patient states he would like to discuss more about our pharmacy and what we offer. Patient expresses interest in Facilities manager.  Please have your medications and supplements available during this appointment.  Patient states he would like to change his appointment from an office visit to a telephone visit on 08/21/2020 at 3:30 pm.  April D Calhoun, [place] Pharmacist Assistant [phone]   Follow-Up:  Pharmacist Review

## 2020-08-21 ENCOUNTER — Ambulatory Visit: Payer: Medicare Other

## 2020-08-21 DIAGNOSIS — M9903 Segmental and somatic dysfunction of lumbar region: Secondary | ICD-10-CM | POA: Diagnosis not present

## 2020-08-21 DIAGNOSIS — E785 Hyperlipidemia, unspecified: Secondary | ICD-10-CM

## 2020-08-21 DIAGNOSIS — S134XXA Sprain of ligaments of cervical spine, initial encounter: Secondary | ICD-10-CM | POA: Diagnosis not present

## 2020-08-21 DIAGNOSIS — S335XXA Sprain of ligaments of lumbar spine, initial encounter: Secondary | ICD-10-CM | POA: Diagnosis not present

## 2020-08-21 DIAGNOSIS — K219 Gastro-esophageal reflux disease without esophagitis: Secondary | ICD-10-CM

## 2020-08-21 DIAGNOSIS — M9901 Segmental and somatic dysfunction of cervical region: Secondary | ICD-10-CM | POA: Diagnosis not present

## 2020-08-21 DIAGNOSIS — I10 Essential (primary) hypertension: Secondary | ICD-10-CM

## 2020-08-21 DIAGNOSIS — M9902 Segmental and somatic dysfunction of thoracic region: Secondary | ICD-10-CM | POA: Diagnosis not present

## 2020-08-21 DIAGNOSIS — S233XXA Sprain of ligaments of thoracic spine, initial encounter: Secondary | ICD-10-CM | POA: Diagnosis not present

## 2020-08-21 NOTE — Progress Notes (Signed)
Chronic Care Management Pharmacy Name: William Park     MRN: [id number]     DOB: [birth year]  Chief Complaint/ HPI William Park, 70 y.o., male, presents for their initial CCM visit with the clinical pharmacist via telephone due to COVID-19 pandemic.  PCP: Shelva Majestic, MD Encounter Diagnoses  Name Primary?  . Hyperlipidemia, unspecified hyperlipidemia type Yes  . Primary hypertension   . Gastroesophageal reflux disease, unspecified whether esophagitis present     Office Visits:  04/10/2020 (PCP): PHQ down from 22 to 9. SSRIs - did not tolerate, potential anxiety component so no bupropion.   Patient Active Problem List   Diagnosis Date Noted  . Peroneal tendinitis, right 05/18/2018  . Major depressive disorder with single episode, in full remission (HCC) 05/12/2018  . Anemia 08/13/2016  . Former smoker 01/06/2015  . CAD (coronary artery disease)   . Hyperlipidemia   . Hypertension   . GERD (gastroesophageal reflux disease)   . Osteoarthritis   . Allergic rhinitis    Past Surgical History:  Procedure Laterality Date  . ANGIOPLASTY     x4  . COLONOSCOPY    . EYE SURGERY    . pyloric stenosis     as a child  . UPPER GASTROINTESTINAL ENDOSCOPY     Family History  Problem Relation Age of Onset  . Dementia Father        passed age 34  . Kidney disease Father        CKD  . CAD Father   . AAA (abdominal aortic aneurysm) Father   . Breast cancer Mother   . Congestive Heart Failure Mother        70 time of death  . CAD Brother   . Colon cancer Neg Hx   . Esophageal cancer Neg Hx   . Stomach cancer Neg Hx   . Rectal cancer Neg Hx    Social History   Social History Narrative   Family: Divorced. 2 daughters. 3 granddaughters. Doesn't get to see them.       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 famotidine  (PEPCID) 20 MG tablet TAKE 1 TABLET BY MOUTH TWICE DAILY  . meloxicam (MOBIC) 15 MG tablet Take 15 mg by mouth daily.  Marland Kitchen aspirin 81 MG tablet Take 81 mg by mouth daily.  . busPIRone (BUSPAR) 5 MG tablet Take 1 tablet (5 mg total) by mouth 3 (three) times daily as needed.  . fluticasone (FLONASE) 50 MCG/ACT nasal spray SPRAY 2 SPRAYS INTO EACH NOSTRIL ONCE DAILY  . losartan (COZAAR) 50 MG tablet Take 1 tablet (50 mg total) by mouth daily.  . Multiple Vitamin (MULTIVITAMIN) tablet Take 1 tablet by mouth daily.  Marland Kitchen omeprazole (PRILOSEC) 40 MG capsule TAKE (1) CAPSULE BY MOUTH ONCE DAILY 30 MINTUES BEFORE BREAKFAST.  . sildenafil (VIAGRA) 100 MG tablet Take 1 tablet (100 mg total) by mouth daily as needed for erectile dysfunction.  . simvastatin (ZOCOR) 80 MG tablet TAKE 1 TABLET BY MOUTH ONCE DAILY. (Patient taking differently: Take 40 mg by mouth daily.)   No facility-administered encounter medications on file as of 08/21/2020.   Patient Care Team    Relationship Specialty Notifications Start End  Shelva Majestic, MD PCP - General Family Medicine  01/06/15   Ave Filter, MD Consulting Physician  Cardiology  03/24/19   Madelin Rear, [facility]  Pharmacist  07/14/20   Madelin Rear, [facility] Pharmacist Pharmacist  07/14/20    Current Diagnosis/Assessment: Goals Addressed            This Visit's Progress   . PharmD Care Plan       CARE PLAN ENTRY (see longitudinal plan of care for additional care plan information)  Current Barriers:  . Chronic Disease Management support, education, and care coordination needs related to Hypertension, Hyperlipidemia, and GERD   Hypertension BP Readings from Last 3 Encounters:  04/10/20 122/78  02/28/20 120/62  07/06/19 117/67   . Pharmacist Clinical Goal(s): o Over the next 365 days, patient will work with PharmD and providers to maintain BP goal <130/80 . Current regimen:  o Losartan 50 mg once daily . Interventions: o Reviewed home BP readings,  potential side - no problems noted . Patient self care activities - Over the next 365 days, patient will: o Check BP at least once every 1-2, document, and provide at future appointments o Ensure daily salt intake < 2300 mg/day  Hyperlipidemia Lab Results  Component Value Date/Time   LDLCALC 62 04/10/2020 03:16 PM   . Pharmacist Clinical Goal(s): o Over the next 365 days, patient will work with PharmD and providers to maintain LDL goal < 70 . Current regimen:  o Simvastatin 40 mg once daily . Interventions: o Reviewed cholesterol goals, discussed potential side effects - at LDL goal, no side effects noted  . Patient self care activities - Over the next 365 days, patient will: o Continue current management  Acid reflux (GERD) . Pharmacist Clinical Goal(s) o Over the next 180 days, patient will work with PharmD and providers to ensure medication safety . Current regimen:  o Omeprazole 40 mg once daily o Famotidine 20 mg twice daily  . Interventions: o Reviewed triggers of acid reflux - focus on reduction of caffeine/coffee o Discussed possibility of stepdown tx should symptoms be controlled with lifestyle modications . Patient self care activities - Over the next 180 days, patient will: o Continue current management  Medication management . Pharmacist Clinical Goal(s): o Over the next 365 days, patient will work with PharmD and providers to maintain optimal medication adherence . Current pharmacy: [facility] . Interventions o Comprehensive medication review performed. o Continue current medication management strategy . Patient self care activities - Over the next 365 days, patient will: o Focus on medication adherence by taking medications as prescribed o Report any questions or concerns to PharmD and/or provider(s) Initial goal documentation.      Hypertension   BP goal <130/80  BP Readings from Last 3 Encounters:  04/10/20 122/78  02/28/20 120/62   07/06/19 117/67    BMP Latest Ref Rng & Units 04/10/2020 08/24/2018 05/12/2018  Glucose 65 - 99 mg/dL 79 - 98  BUN 7 - 25 mg/dL $Remove'15 21 22  'TDwcSsG$ Creatinine 0.70 - 1.25 mg/dL 1.03 1.4(A) 1.17  BUN/Creat Ratio 6 - 22 (calc) NOT APPLICABLE - -  Sodium 185 - 146 mmol/L 142 140 139  Potassium 3.5 - 5.3 mmol/L 3.9 4.5 4.3  Chloride 98 - 110 mmol/L 104 - 102  CO2 20 - 32 mmol/L 31 - 32  Calcium 8.6 - 10.3 mg/dL 9.0 - 9.6  Previous medications: losartan 100 mg/day, hydrochlorothiazide 25 mg (fatigue).  No particular diet. Fruits every day. Oatmeal/eggs/bacon, very little red meat. Greens/salad 2-3x/week. Lots of chicken and rice. Does not add salt to anything. Continues to  work as Engineer, drilling.  Checks BP at home several times per month. Recent home readings: 130s/80s or less.  Denies dizziness. Patient is currently at goal on the following medications:  . Losartan 50 mg once daily   We discussed diet/exercise - Maintain a healthy weight and exercise regularly, as directed by your health care provider. Eat healthy foods, such as: Lean proteins, complex carbohydrates, fresh fruits and vegetables, low-fat dairy products, healthy fats.  Plan  Continue current medications.  Hyperlipidemia   LDL goal < 70  Lipid Panel     Component Value Date/Time   CHOL 141 04/10/2020 1516   CHOL 134 08/24/2018 0000   CHOL 166 02/10/2018 0000   TRIG 197 (H) 04/10/2020 1516   TRIG 170 (A) 08/24/2018 0000   TRIG 220 (A) 02/10/2018 0000   HDL 52 04/10/2020 1516   HDL 49 08/24/2018 0000   HDL 46 02/10/2018 0000   LDLCALC 62 04/10/2020 1516   LDLCALC 51 08/24/2018 0000   LDLCALC 76 02/10/2018 0000   LDLCALC 29 05/20/2017 0000    Hepatic Function Latest Ref Rng & Units 04/10/2020 08/24/2018 02/10/2018  Total Protein 6.1 - 8.1 g/dL 6.3 - -  Albumin 3.5 - 5.2 g/dL - - -  AST 10 - 35 U/L $Remo'17 17 20  'WONxZ$ ALT 9 - 46 U/L $Remo'15 23 23  'wZLeH$ Alk Phosphatase 25 - 125 - 62 73  Total Bilirubin 0.2 - 1.2 mg/dL 0.8 - -   Bilirubin, Direct 0.0 - 0.3 mg/dL - - -  Previous medications: none noted.   Reports cardiology has reduced simvastatin to 40 mg once daily.   CAD -s/p 4 angioplasty's - last in 97, HTN, former smoker.  Denies intolerance/side effects. Patient is currently at goal the following medications:  . Simvastatin 80 mg once daily - takes half tablet daily    We discussed  Plan  Continue current medications.  GERD   No results found for: VITAMINB12.  Previous medications: Zantac, Nexium.  Has recently started back on meloxicom 15 mg as needed.   Denies reflux with current regimen. Currently controlled on: . Omeprazole 40 mg once daily . Famotidine 20 mg twice daily  We discussed: Avoidance of potential triggers such as alcohol, fatty foods, lying down after eating, and tomato sauce. Aware of NSAID-related risks from GI/CV standpoint - consider risks and minimize use.   Plan   Continue current medications. Consider B12 lab at f/u visit.   Depression/anxiety   PHQ9 SCORE ONLY 04/10/2020 04/10/2020 02/28/2020  PHQ-9 Total Score $RemoveBef'9 1 22  'rjSZOmKife$ Previous medications: sertraline (felt like crawling out of skin).   Has not used Buspar for several weeks due to not needing. Feels like it does help if when needed.  Patient is currently controlled on the following medications:  . Buspar 5 mg 1-3 times daily as needed  Plan  Continue current medications.  Vaccines   Immunization History  Administered Date(s) Administered  . Fluad Quad(high Dose 65+) 04/10/2020  . Influenza Whole 04/29/2019  . Influenza, High Dose Seasonal PF 04/07/2017, 05/12/2018  . Influenza,inj,Quad PF,6+ Mos 07/21/2015  . Influenza-Unspecified 07/17/2016  . Moderna Sars-Covid-2 Vaccination 11/22/2019, 12/20/2019  . Pneumococcal Conjugate-13 07/21/2015  . Pneumococcal Polysaccharide-23 03/10/2017  . Tdap 01/26/2016   Reviewed and discussed patient's vaccination history. Declined COVID-19  booster.  Plan  Recommended patient receive COVID-19 booster if wanting to reconsider.   Medication Management / Care Coordination   Receives prescription medications from:  [place],  [place] - [address] [place] [address] Phone: [phone] Fax: [phone]   Pt states would be [facility].   Confirmed that pharmacy will delivery to [facility] Would like [place] to do cost review and confirm medication delivery.   Plan  Continue current medication management strategy. ___________________________ SDOH (Social Determinants of Health) assessments performed: Yes. Future Appointments  Date Time Provider [place]  10/16/2020  1:20 PM Marin Olp, MD [facility]  01/09/2021  1:30 PM [facility] CCM PHARMACIST [facility]  04/12/2021  8:45 AM [facility] HEALTH COACH [facility]   Verbal consent obtained for [facility] enhanced pharmacy services (medication synchronization, adherence packaging, delivery coordination). A medication sync plan was created to allow patient to get all medications delivered once every 30 to 90 days per patient preference. Patient understands they have freedom to choose pharmacy and clinical pharmacist will coordinate care between all prescribers and [facility].  Visit follow-up:  . CPA follow-up: cost review. Marland Kitchen RPH follow-up: 5-6 months.  Madelin Rear, Pharm.D., BCGP Clinical Pharmacist [facility] [phone]

## 2020-08-21 NOTE — Patient Instructions (Signed)
[name],  Thank you for taking the time to review your medications with me today.  I have included our care plan/goals in the following pages. Please review and call me at [phone] with any questions!  Thanks! Ellin Mayhew, Pharm.D., BCGP Clinical Pharmacist [facility] at [facility] [phone]   Goals Addressed            This Visit's Progress   . PharmD Care Plan       CARE PLAN ENTRY (see longitudinal plan of care for additional care plan information)  Current Barriers:  . Chronic Disease Management support, education, and care coordination needs related to Hypertension, Hyperlipidemia, and GERD   Hypertension BP Readings from Last 3 Encounters:  04/10/20 122/78  02/28/20 120/62  07/06/19 117/67   . Pharmacist Clinical Goal(s): o Over the next 365 days, patient will work with PharmD and providers to maintain BP goal <130/80 . Current regimen:  o Losartan 50 mg once daily . Interventions: o Reviewed home BP readings, potential side - no problems noted . Patient self care activities - Over the next 365 days, patient will: o Check BP at least once every 1-2, document, and provide at future appointments o Ensure daily salt intake < 2300 mg/day  Hyperlipidemia Lab Results  Component Value Date/Time   LDLCALC 62 04/10/2020 03:16 PM   . Pharmacist Clinical Goal(s): o Over the next 365 days, patient will work with PharmD and providers to maintain LDL goal < 70 . Current regimen:  o Simvastatin 40 mg once daily . Interventions: o Reviewed cholesterol goals, discussed potential side effects - at LDL goal, no side effects noted  . Patient self care activities - Over the next 365 days, patient will: o Continue current management  Acid reflux (GERD) . Pharmacist Clinical Goal(s) o Over the next 180 days, patient will work with PharmD and providers to ensure medication safety . Current regimen:  o Omeprazole 40 mg once  daily o Famotidine 20 mg twice daily  . Interventions: o Reviewed triggers of acid reflux - focus on reduction of caffeine/coffee o Discussed possibility of stepdown tx should symptoms be controlled with lifestyle modications . Patient self care activities - Over the next 180 days, patient will: o Continue current management  Medication management . Pharmacist Clinical Goal(s): o Over the next 365 days, patient will work with PharmD and providers to maintain optimal medication adherence . Current pharmacy: [facility] . Interventions o Comprehensive medication review performed. o Continue current medication management strategy . Patient self care activities - Over the next 365 days, patient will: o Focus on medication adherence by taking medications as prescribed o Report any questions or concerns to PharmD and/or provider(s) Initial goal documentation.      [name] was given information about Chronic Care Management services today including:  1. CCM service includes personalized support from designated clinical staff supervised by his physician, including individualized plan of care and coordination with other care providers 2. 24/7 contact phone numbers for assistance for urgent and routine care needs. 3. Standard insurance, coinsurance, copays and deductibles apply for chronic care management only during months in which we provide at least 20 minutes of these services. Most insurances cover these services at 100%, however patients may be responsible for any copay, coinsurance and/or deductible if applicable. This service may help you avoid the need for more expensive face-to-face services. 4. Only one practitioner may furnish and bill  the service in a calendar month. 5. The patient may stop CCM services at any time (effective at the end of the month) by phone call to the office staff.  Patient agreed to services and verbal consent obtained.   The patient verbalized  understanding of instructions provided today and agreed to receive a MyChart copy of patient instruction and/or educational materials. Telephone follow up appointment with pharmacy team member scheduled for: See next appointment with "Care Management Staff" under "What's Next" below.   Madelin Rear, Pharm.D., BCGP Clinical Pharmacist [facility] at [facility] [phone]  Food Choices for Gastroesophageal Reflux Disease, Adult When you have gastroesophageal reflux disease (GERD), the foods you eat and your eating habits are very important. Choosing the right foods can help ease your discomfort. Think about working with a food expert (dietitian) to help you make good choices. What are tips for following this plan? Reading food labels  Look for foods that are low in saturated fat. Foods that may help with your symptoms include: ? Foods that have less than 5% of daily value (DV) of fat. ? Foods that have 0 grams of trans fat. Cooking  Do not fry your food.  Cook your food by baking, steaming, grilling, or broiling. These are all methods that do not need a lot of fat for cooking.  To add flavor, try to use herbs that are low in spice and acidity. Meal planning  Choose healthy foods that are low in fat, such as: ? Fruits and vegetables. ? Whole grains. ? Low-fat dairy products. ? Lean meats, fish, and poultry.  Eat small meals often instead of eating 3 large meals each day. Eat your meals slowly in a place where you are relaxed. Avoid bending over or lying down until 2-3 hours after eating.  Limit high-fat foods such as fatty meats or fried foods.  Limit your intake of fatty foods, such as oils, butter, and shortening.  Avoid the following as told by your doctor: ? Foods that cause symptoms. These may be different for different people. Keep a food diary to keep track of foods that cause symptoms. ? Alcohol. ? Drinking a lot of liquid with meals. ? Eating meals  during the 2-3 hours before bed.   Lifestyle  Stay at a healthy weight. Ask your doctor what weight is healthy for you. If you need to lose weight, work with your doctor to do so safely.  Exercise for at least 30 minutes on 5 or more days each week, or as told by your doctor.  Wear loose-fitting clothes.  Do not smoke or use any products that contain nicotine or tobacco. If you need help quitting, ask your doctor.  Sleep with the head of your bed higher than your feet. Use a wedge under the mattress or blocks under the bed frame to raise the head of the bed.  Chew sugar-free gum after meals. What foods should eat? Eat a healthy, well-balanced diet of fruits, vegetables, whole grains, low-fat dairy products, lean meats, fish, and poultry. Each person is different. Foods that may cause symptoms in one person may not cause any symptoms in another person. Work with your doctor to find foods that are safe for you. The items listed above may not be a complete list of what you can eat and drink. Contact a food expert for more options.   What foods should I avoid? Limiting some of these foods may help in managing the symptoms of GERD.  Everyone is different. Talk with a food expert or your doctor to help you find the exact foods to avoid, if any. Fruits Any fruits prepared with added fat. Any fruits that cause symptoms. For some people, this may include citrus fruits, such as oranges, grapefruit, pineapple, and lemons. Vegetables Deep-fried vegetables. [place] fries. Any vegetables prepared with added fat. Any vegetables that cause symptoms. For some people, this may include tomatoes and tomato products, chili peppers, onions and garlic, and horseradish. Grains Pastries or quick breads with added fat. Meats and other proteins High-fat meats, such as fatty beef or pork, hot dogs, ribs, ham, sausage, salami, and bacon. Fried meat or protein, including fried fish and fried chicken. Nuts and nut  butters, in large amounts. Dairy Whole milk and chocolate milk. Sour cream. Cream. Ice cream. Cream cheese. Milkshakes. Fats and oils Butter. Margarine. Shortening. Ghee. Beverages Coffee and tea, with or without caffeine. Carbonated beverages. Sodas. Energy drinks. Fruit juice made with acidic fruits, such as orange or grapefruit. Tomato juice. Alcoholic drinks. Sweets and desserts Chocolate and cocoa. Donuts. Seasonings and condiments Pepper. Peppermint and spearmint. Added salt. Any condiments, herbs, or seasonings that cause symptoms. For some people, this may include curry, hot sauce, or vinegar-based salad dressings. The items listed above may not be a complete list of what you should not eat and drink. Contact a food expert for more options. Questions to ask your doctor Diet and lifestyle changes are often the first steps that are taken to manage symptoms of GERD. If diet and lifestyle changes do not help, talk with your doctor about taking medicines. Where to find more information  International Foundation for Gastrointestinal Disorders: aboutgerd.org Summary  When you have GERD, food and lifestyle choices are very important in easing your symptoms.  Eat small meals often instead of 3 large meals a day. Eat your meals slowly and in a place where you are relaxed.  Avoid bending over or lying down until 2-3 hours after eating.  Limit high-fat foods such as fatty meats or fried foods. This information is not intended to replace advice given to you by your health care provider. Make sure you discuss any questions you have with your health care provider. Document Revised: 01/24/2020 Document Reviewed: 01/24/2020 Elsevier Patient Education  [place].

## 2020-08-23 ENCOUNTER — Telehealth: Payer: Self-pay

## 2020-08-23 NOTE — Chronic Care Management (AMB) (Signed)
    Chronic Care Management Pharmacy Assistant   Name: Mercedes Valeriano III  MRN: [id number] DOB: 05-23-1951  Reason for Encounter: Medication Cost Review   PCP : Marin Olp, MD  Allergies:  No Known Allergies  Medications: Outpatient Encounter Medications as of 08/23/2020  Medication Sig  . aspirin 81 MG tablet Take 81 mg by mouth daily.  . busPIRone (BUSPAR) 5 MG tablet Take 1 tablet (5 mg total) by mouth 3 (three) times daily as needed.  . famotidine (PEPCID) 20 MG tablet TAKE 1 TABLET BY MOUTH TWICE DAILY  . fluticasone (FLONASE) 50 MCG/ACT nasal spray SPRAY 2 SPRAYS INTO EACH NOSTRIL ONCE DAILY  . losartan (COZAAR) 50 MG tablet Take 1 tablet (50 mg total) by mouth daily.  . meloxicam (MOBIC) 15 MG tablet Take 15 mg by mouth daily.  . Multiple Vitamin (MULTIVITAMIN) tablet Take 1 tablet by mouth daily.  Marland Kitchen omeprazole (PRILOSEC) 40 MG capsule TAKE (1) CAPSULE BY MOUTH ONCE DAILY 30 MINTUES BEFORE BREAKFAST.  . sildenafil (VIAGRA) 100 MG tablet Take 1 tablet (100 mg total) by mouth daily as needed for erectile dysfunction.  . simvastatin (ZOCOR) 80 MG tablet TAKE 1 TABLET BY MOUTH ONCE DAILY. (Patient taking differently: Take 40 mg by mouth daily.)   No facility-administered encounter medications on file as of 08/23/2020.    Current Diagnosis: Patient Active Problem List   Diagnosis Date Noted  . Peroneal tendinitis, right 05/18/2018  . Major depressive disorder with single episode, in full remission ([place]) 05/12/2018  . Anemia 08/13/2016  . Former smoker 01/06/2015  . CAD (coronary artery disease)   . Hyperlipidemia   . Hypertension   . GERD (gastroesophageal reflux disease)   . Osteoarthritis   . Allergic rhinitis    Medication Cost Review  [facility] is Standard in-net work Total yearly drug + premium cost [employer] is Preferred in-net work Total yearly drug + premium cost [phone]  Patient will save $647.04/year switching to  [facility]. Patient is aware of this information and wants to onboard with our pharmacy. Patient states he is working all week so he will call me on Monday(08/29/2019) with how much medication he has on hand of each drug.   -Patient returned call (08/28/2020) medications reviewed with patient.  -Onboarding form completed. Profile transfer requested from [facility].  Refills requested from Garret Reddish, MD for medications: Simvastatin 80 mg tablet daily Meloxicam 15 mg tablet daily Pepcid 20 mg tablet twice daily Flonase 50 mcg nasal spray  April D Calhoun, [place] Pharmacist Assistant [phone]   Follow-Up:  Pharmacist Review

## 2020-08-27 NOTE — Progress Notes (Signed)
Phone [phone] In person visit   Subjective:   Abdiaziz Kretzer III is a 70 y.o. year old very pleasant male patient who presents for/with See problem oriented charting Chief Complaint  Patient presents with  . Back Pain    Since last week. The patient states the pain was very bad last week he's doing okay this week. When he's standing and walking is when he has the pain the most. States he's having no pain right now.     This visit occurred during the SARS-CoV-2 public health emergency.  Safety protocols were in place, including screening questions prior to the visit, additional usage of staff PPE, and extensive cleaning of exam room while observing appropriate contact time as indicated for disinfecting solutions.   Past Medical History-  Patient Active Problem List   Diagnosis Date Noted  . CAD (coronary artery disease)     Priority: High  . Major depressive disorder with single episode, in full remission ([place]) 05/12/2018    Priority: Medium  . Hyperlipidemia     Priority: Medium  . Hypertension     Priority: Medium  . GERD (gastroesophageal reflux disease)     Priority: Medium  . Osteoarthritis     Priority: Medium  . Former smoker 01/06/2015    Priority: Low  . Allergic rhinitis     Priority: Low  . Peroneal tendinitis, right 05/18/2018  . Anemia 08/13/2016    Medications- reviewed and updated Current Outpatient Medications  Medication Sig Dispense Refill  . aspirin 81 MG tablet Take 81 mg by mouth daily.    . busPIRone (BUSPAR) 5 MG tablet Take 1 tablet (5 mg total) by mouth 3 (three) times daily as needed. 90 tablet 2  . famotidine (PEPCID) 20 MG tablet TAKE 1 TABLET BY MOUTH TWICE DAILY 180 tablet 0  . fluticasone (FLONASE) 50 MCG/ACT nasal spray SPRAY 2 SPRAYS INTO EACH NOSTRIL ONCE DAILY 48 g 0  . losartan (COZAAR) 50 MG tablet Take 1 tablet (50 mg total) by mouth daily. 90 tablet 3  . meloxicam (MOBIC) 15 MG tablet Take 15 mg by mouth daily.    .  Multiple Vitamin (MULTIVITAMIN) tablet Take 1 tablet by mouth daily.    Marland Kitchen omeprazole (PRILOSEC) 40 MG capsule TAKE (1) CAPSULE BY MOUTH ONCE DAILY 30 MINTUES BEFORE BREAKFAST. 90 capsule 0  . predniSONE (DELTASONE) 20 MG tablet Take 2 pills for 3 days, 1 pill for 4 days 10 tablet 0  . sildenafil (VIAGRA) 100 MG tablet Take 1 tablet (100 mg total) by mouth daily as needed for erectile dysfunction. 100 tablet 11  . simvastatin (ZOCOR) 80 MG tablet TAKE 1 TABLET BY MOUTH ONCE DAILY. (Patient taking differently: Take 40 mg by mouth daily.) 90 tablet 0   No current facility-administered medications for this visit.     Objective:  BP 134/86   Pulse (!) 57   Temp 97.9 F (36.6 C) (Temporal)   Ht 5\' 7"  (1.702 m)   Wt 219 lb 9.6 oz (99.6 kg)   SpO2 95%   BMI 34.39 kg/m  Gen: NAD, resting comfortably CV: RRR no murmurs rubs or gallops Lungs: CTAB no crackles, wheeze, rhonchi Ext: no edema Skin: warm, dry Back - Spine with normal alignment and no deformity.  No tenderness to vertebral process palpation.  Paraspinous muscles are not tender and without spasm.   Range of motion is full at neck and lumbar sacral regions. Negative Straight leg raise.  Neuro- no saddle anesthesia, 5/5  strength lower extremities, 2+ reflexes     Assessment and Plan  # Right low Back Pain with pain into right leg/foot S: history of back pain even back to high school. No recent imaging.   patient with flare up of back pain starting last week. Pain up to 8/10- only relief he got was sitting down and resting- also was taking a fair amount of advil/aleve- actually was using with meloxicam- advised against. Pain was down the right low back into the right leg as far down as the foot.  Has improved some this week currently at 0/10 at moment. Doing ok with short trips walking but prolonged walking like a store still causing flare up 6/10.  Pain has been worse with standing and walking. Gets numbness/tingling into right  leg  Has seen a chiropractor for years and got some relief but when he went last week there was no improvement bc he was not having pain at moment of visit.   History OA in hands on meloxicam in past (not ideal with CAD long term)  Previous imaging-none  ROS-No saddle anesthesia, bladder incontinence, fecal incontinence, weakness in extremity other than perhaps mild in right knee. History negative for trauma, history of cancer, fever, chills, unintentional weight loss, recent bacterial infection, recent IV drug use, HIV, pain worse at night or while supine.  A/P: this appears to be right sideded low back pain with sciatica likely due to slight disc herniation. We will trial course of prednisone for 7 days. Offered PT but hed like to see how he does on medicine first but will contact me if fails to improve or worsens. With long term back pain discussed x-rays but he opted to hold off for now- if persistent issues we can pursue this. He does update me that chiropractor has recent ones on file and mainly arthritis issues.  -if medicine and PT not helpful we will refer to sports medicine as next step  # Depression S: Medication: buspirone 5 mg up to TID- has not had to use in a while, off antidepressants Depression screen [facility] 2/9 08/28/2020 04/10/2020 04/10/2020  Decreased Interest 0 0 0  Down, Depressed, Hopeless 0 1 1  PHQ - 2 Score 0 1 1  Altered sleeping 0 2 -  Tired, decreased energy 0 1 -  Change in appetite 0 1 -  Feeling bad or failure about yourself  0 1 -  Trouble concentrating 0 1 -  Moving slowly or fidgety/restless 0 1 -  Suicidal thoughts 0 1 -  PHQ-9 Score 0 9 -  Difficult doing work/chores Not difficult at all - -  A/P: full remission even without antidepressants- life situation much more stable - continue to monitor for recurrence  #hypertension S: medication: losartan 50mg  BP Readings from Last 3 Encounters:  08/28/20 134/86  04/10/20 122/78  02/28/20 120/62  A/P: blood  pressure well controlled- discussed prednisone may increase this short term but should come back down  Recommended follow up: march visit Future Appointments  Date Time Provider [place]  10/16/2020  1:20 PM Marin Olp, MD [facility]  01/09/2021  1:30 PM [facility] CCM PHARMACIST [facility]  04/12/2021  8:45 AM [facility] HEALTH COACH [facility]    Lab/Order associations:   ICD-10-CM   1. Acute right-sided low back pain with right-sided sciatica  M54.41   2. Primary hypertension  I10   3. Recurrent major depressive disorder, in full remission ([place]) Chronic F33.42     Meds  ordered this encounter  Medications  . predniSONE (DELTASONE) 20 MG tablet    Sig: Take 2 pills for 3 days, 1 pill for 4 days    Dispense:  10 tablet    Refill:  0    Return precautions advised.  Garret Reddish, MD

## 2020-08-27 NOTE — Patient Instructions (Addendum)
  Health Maintenance Due  Topic Date Due  . COVID-19 Vaccine (3 - Booster for Moderna series) - I would encourage you to get this 06/21/2020   this appears to be right sideded low back pain with sciatica likely due to slight disc herniation. We will trial course of prednisone for 7 days. Offered PT but hed like to see how he does on medicine first but will contact me if fails to improve or worsens. With long term back pain discussed x-rays but he opted to hold off for now- if persistent issues we can pursue this. He does update me that chiropractor has recent ones on file and mainly arthritis issues.  -if medicine and PT not helpful we will refer to sports medicine as next step  Recommended follow up: keep march visit

## 2020-08-28 ENCOUNTER — Encounter: Payer: Self-pay | Admitting: Family Medicine

## 2020-08-28 ENCOUNTER — Other Ambulatory Visit: Payer: Self-pay

## 2020-08-28 ENCOUNTER — Ambulatory Visit (INDEPENDENT_AMBULATORY_CARE_PROVIDER_SITE_OTHER): Payer: Medicare Other | Admitting: Family Medicine

## 2020-08-28 ENCOUNTER — Telehealth: Payer: Self-pay

## 2020-08-28 VITALS — BP 134/86 | HR 57 | Temp 97.9°F | Ht 67.0 in | Wt 219.6 lb

## 2020-08-28 DIAGNOSIS — M5441 Lumbago with sciatica, right side: Secondary | ICD-10-CM

## 2020-08-28 DIAGNOSIS — F3342 Major depressive disorder, recurrent, in full remission: Secondary | ICD-10-CM

## 2020-08-28 DIAGNOSIS — I10 Essential (primary) hypertension: Secondary | ICD-10-CM

## 2020-08-28 MED ORDER — FLUTICASONE PROPIONATE 50 MCG/ACT NA SUSP: NASAL | 0 refills | Status: DC

## 2020-08-28 MED ORDER — FAMOTIDINE 20 MG PO TABS
20.0000 mg | ORAL_TABLET | Freq: Two times a day (BID) | ORAL | 3 refills | Status: DC
Start: 2020-08-28 — End: 2021-07-04

## 2020-08-28 MED ORDER — SIMVASTATIN 80 MG PO TABS
80.0000 mg | ORAL_TABLET | Freq: Every day | ORAL | 0 refills | Status: DC
Start: 2020-08-28 — End: 2021-01-12

## 2020-08-28 MED ORDER — PREDNISONE 20 MG PO TABS: ORAL_TABLET | ORAL | 0 refills | Status: DC

## 2020-08-28 MED ORDER — MELOXICAM 15 MG PO TABS
15.0000 mg | ORAL_TABLET | Freq: Every day | ORAL | 3 refills | Status: DC
Start: 2020-08-28 — End: 2021-02-19

## 2020-08-28 NOTE — Telephone Encounter (Signed)
[facility].

## 2020-08-28 NOTE — Telephone Encounter (Signed)
..   LAST APPOINTMENT DATE: 08/28/2020   NEXT APPOINTMENT DATE:@3 /21/2022  MEDICATION:simvastatin (ZOCOR) 80 MG tablet meloxicam (MOBIC) 15 MG tablet famotidine (PEPCID) 20 MG tablet fluticasone (FLONASE) 50 MCG/ACT nasal spray       PHARMACY: [facility] - [place], [place] - [address]  Let patient know to contact pharmacy at the end of the day to make sure medication is ready.  Please notify patient to allow 48-72 hours to process  Encourage patient to contact the pharmacy for refills or they can request refills through [place]:   LAST REFILL:  QTY:  REFILL DATE:    OTHER COMMENTS:    Okay for refill?  Please advise

## 2020-08-29 ENCOUNTER — Telehealth: Payer: Self-pay

## 2020-08-29 NOTE — Telephone Encounter (Signed)
  LAST APPOINTMENT DATE: 08/28/2020   NEXT APPOINTMENT DATE:@3 /21/2022  MEDICATION: omeprazole (PRILOSEC) 40 MG capsule  PHARMACY:[facility] - [place], [place] - [address]  Comments: patient is completely out

## 2020-08-30 MED ORDER — OMEPRAZOLE 40 MG PO CPDR: DELAYED_RELEASE_CAPSULE | ORAL | 3 refills | Status: DC

## 2020-08-30 NOTE — Telephone Encounter (Signed)
Rx sent in

## 2020-09-12 DIAGNOSIS — M9903 Segmental and somatic dysfunction of lumbar region: Secondary | ICD-10-CM | POA: Diagnosis not present

## 2020-09-12 DIAGNOSIS — M9902 Segmental and somatic dysfunction of thoracic region: Secondary | ICD-10-CM | POA: Diagnosis not present

## 2020-09-12 DIAGNOSIS — M9901 Segmental and somatic dysfunction of cervical region: Secondary | ICD-10-CM | POA: Diagnosis not present

## 2020-09-12 DIAGNOSIS — S233XXA Sprain of ligaments of thoracic spine, initial encounter: Secondary | ICD-10-CM | POA: Diagnosis not present

## 2020-09-12 DIAGNOSIS — S134XXA Sprain of ligaments of cervical spine, initial encounter: Secondary | ICD-10-CM | POA: Diagnosis not present

## 2020-09-12 DIAGNOSIS — S335XXA Sprain of ligaments of lumbar spine, initial encounter: Secondary | ICD-10-CM | POA: Diagnosis not present

## 2020-09-14 ENCOUNTER — Other Ambulatory Visit: Payer: Self-pay | Admitting: Family Medicine

## 2020-09-19 ENCOUNTER — Other Ambulatory Visit: Payer: Self-pay | Admitting: Family Medicine

## 2020-09-25 DIAGNOSIS — M9901 Segmental and somatic dysfunction of cervical region: Secondary | ICD-10-CM | POA: Diagnosis not present

## 2020-09-25 DIAGNOSIS — S233XXA Sprain of ligaments of thoracic spine, initial encounter: Secondary | ICD-10-CM | POA: Diagnosis not present

## 2020-09-25 DIAGNOSIS — M9902 Segmental and somatic dysfunction of thoracic region: Secondary | ICD-10-CM | POA: Diagnosis not present

## 2020-09-25 DIAGNOSIS — S134XXA Sprain of ligaments of cervical spine, initial encounter: Secondary | ICD-10-CM | POA: Diagnosis not present

## 2020-09-25 DIAGNOSIS — S335XXA Sprain of ligaments of lumbar spine, initial encounter: Secondary | ICD-10-CM | POA: Diagnosis not present

## 2020-09-25 DIAGNOSIS — M9903 Segmental and somatic dysfunction of lumbar region: Secondary | ICD-10-CM | POA: Diagnosis not present

## 2020-09-25 NOTE — Telephone Encounter (Signed)
Ok for refill? 

## 2020-09-25 NOTE — Patient Instructions (Addendum)
We will call you within two weeks about your referral to Sports medicine. If you do not hear within 2 weeks, give [place] a call.   Since prednisone helped so much lets use as a short term bandaid until Dr. Georgina Snell can see you to develop longer term plan  If pain flares up again at work- let me write you a work note- you declined for now  Recommended follow up: Return for as needed for new, worsening, persistent symptoms.  -can cancel at the desk visit in 3 weeks and then schedule 04/10/21 or later unless you need me sooner

## 2020-09-25 NOTE — Progress Notes (Signed)
Phone [phone] In person visit   Subjective:   William Park is a 70 y.o. year old very pleasant male patient who presents for/with See problem oriented charting Chief Complaint  Patient presents with  . Injections    Patient states that he has a bad back and he wants an injection in his lower back    This visit occurred during the SARS-CoV-2 public health emergency.  Safety protocols were in place, including screening questions prior to the visit, additional usage of staff PPE, and extensive cleaning of exam room while observing appropriate contact time as indicated for disinfecting solutions.   Past Medical History-  Patient Active Problem List   Diagnosis Date Noted  . CAD (coronary artery disease)     Priority: High  . Major depressive disorder with single episode, in full remission ([place]) 05/12/2018    Priority: Medium  . Hyperlipidemia     Priority: Medium  . Hypertension     Priority: Medium  . GERD (gastroesophageal reflux disease)     Priority: Medium  . Osteoarthritis     Priority: Medium  . Former smoker 01/06/2015    Priority: Low  . Allergic rhinitis     Priority: Low  . Peroneal tendinitis, right 05/18/2018  . Anemia 08/13/2016    Medications- reviewed and updated Current Outpatient Medications  Medication Sig Dispense Refill  . aspirin 81 MG tablet Take 81 mg by mouth daily.    . busPIRone (BUSPAR) 5 MG tablet Take 1 tablet (5 mg total) by mouth 3 (three) times daily as needed. 90 tablet 2  . famotidine (PEPCID) 20 MG tablet Take 1 tablet (20 mg total) by mouth 2 (two) times daily. 180 tablet 3  . fluticasone (FLONASE) 50 MCG/ACT nasal spray SPRAY 2 SPRAYS INTO EACH NOSTRIL ONCE DAILY 48 g 0  . losartan (COZAAR) 50 MG tablet Take 1 tablet (50 mg total) by mouth daily. 90 tablet 3  . meloxicam (MOBIC) 15 MG tablet Take 1 tablet (15 mg total) by mouth daily. 90 tablet 3  . Multiple Vitamin (MULTIVITAMIN) tablet Take 1 tablet by mouth daily.    Marland Kitchen  omeprazole (PRILOSEC) 40 MG capsule TAKE (1) CAPSULE BY MOUTH ONCE DAILY 30 MINTUES BEFORE BREAKFAST. 90 capsule 3  . sildenafil (VIAGRA) 100 MG tablet Take 1 tablet (100 mg total) by mouth daily as needed for erectile dysfunction. 100 tablet 11  . simvastatin (ZOCOR) 80 MG tablet Take 1 tablet (80 mg total) by mouth daily. 90 tablet 0  . predniSONE (DELTASONE) 20 MG tablet Take 2 pills for 2 days, 1 pill for 8 days 12 tablet 0   No current facility-administered medications for this visit.     Objective:  BP (!) 142/74   Pulse 60   Temp 98.4 F (36.9 C) (Temporal)   Ht 5\' 7"  (1.702 m)   Wt 219 lb 12.8 oz (99.7 kg)   SpO2 94%   BMI 34.43 kg/m  Gen: NAD, resting comfortably CV: RRR no murmurs rubs or gallops Lungs: CTAB no crackles, wheeze, rhonchi Ext: no edema Skin: warm, dry Back - Normal skin, Spine with normal alignment and no deformity.  No tenderness to vertebral process palpation.  Paraspinous muscles are not tender and without spasm.    Negative Straight leg raise.  Neuro- no saddle anesthesia, 5/5 strength lower extremities, 2+ reflexes     Assessment and Plan   # Low back pain and radiating into right thigh to behind knee and occasionally groin  S:pain was so bad recently had to leave work. Can with standing or walking. Seems to be better with sitting. Has been working with the chiropractor. Chiropractor told him - thought he had spinal stenosis potentially (he had done x-rays prior). No mri or CT has been done. He was told could contact neurosurgeon.   Prednisone did help him short term ( a week after finishing) - then about 2 weeks ago flared back up (saw him 08/28/20)   No incontinence. Mild weakness in right leg over last 6 months. No numbness or tingling in groin. Does get some numbness/tingling into leg. history negative for trauma, history of cancer, fever, chills, unintentional weight loss, recent bacterial infection, recent IV drug use, HIV, pain worse at night or  while supine.  A/P: Right low back pain with sciatica.  Responded very well to prednisone about a month ago.  We will refer to sports medicine but he is in significant pain and requests a refill of prednisone-this was done today.  Patient was interested in epidural steroid injections and I discussed with him that is not something I am qualified to do-he can discuss with Dr. Georgina Snell about possible referral to IR.  Patient has had x-rays with chiropractor but we may need to get separate x-rays and MRI.  Patient reports some right leg weakness over the last 6 months-I did not appreciate this on exam today  He has been using meloxicam primarily for hand arthritis but now with current pain also using it for his back -we will monitor renal function-if any significant worsening will need to stop long-term  #hypertension S: medication:  losartan 50mg  BP Readings from Last 3 Encounters:  09/26/20 (!) 142/74  08/28/20 134/86  04/10/20 122/78  A/P: Mild poor control today but patient is in pain-I asked him to update me in approximately a week with home readings  #CAD-denies chest pain or shortness of breath.  Compliant with aspirin #hyperlipidemia S: Medication: Simvastatin 80 mg Lab Results  Component Value Date   CHOL 141 04/10/2020   HDL 52 04/10/2020   LDLCALC 62 04/10/2020   TRIG 197 (H) 04/10/2020   CHOLHDL 2.7 04/10/2020   A/P: CAD asymptomatic.  Lipids well controlled.  Continue simvastatin and aspirin  Recommended follow up: Return for as needed for new, worsening, persistent symptoms.  Also recommended approximately 30-month follow-up-we need to continue to monitor renal function Future Appointments  Date Time Provider [place]  01/09/2021  1:30 PM [facility] CCM PHARMACIST [facility]  04/09/2021  3:00 PM Marin Olp, MD [facility]  04/12/2021  8:45 AM [facility] HEALTH COACH [facility]    Lab/Order associations:   ICD-10-CM   1. Acute right-sided low back pain with  right-sided sciatica  M54.41 Ambulatory referral to Sports Medicine  2. Primary hypertension  I10 Comprehensive metabolic panel    Meds ordered this encounter  Medications  . predniSONE (DELTASONE) 20 MG tablet    Sig: Take 2 pills for 2 days, 1 pill for 8 days    Dispense:  12 tablet    Refill:  0    Return precautions advised.  Garret Reddish, MD

## 2020-09-26 ENCOUNTER — Encounter: Payer: Self-pay | Admitting: Family Medicine

## 2020-09-26 ENCOUNTER — Ambulatory Visit (INDEPENDENT_AMBULATORY_CARE_PROVIDER_SITE_OTHER): Payer: Medicare Other | Admitting: Family Medicine

## 2020-09-26 ENCOUNTER — Other Ambulatory Visit: Payer: Self-pay

## 2020-09-26 VITALS — BP 142/74 | HR 60 | Temp 98.4°F | Ht 67.0 in | Wt 219.8 lb

## 2020-09-26 DIAGNOSIS — M19042 Primary osteoarthritis, left hand: Secondary | ICD-10-CM

## 2020-09-26 DIAGNOSIS — E785 Hyperlipidemia, unspecified: Secondary | ICD-10-CM | POA: Diagnosis not present

## 2020-09-26 DIAGNOSIS — I2510 Atherosclerotic heart disease of native coronary artery without angina pectoris: Secondary | ICD-10-CM | POA: Diagnosis not present

## 2020-09-26 DIAGNOSIS — I10 Essential (primary) hypertension: Secondary | ICD-10-CM | POA: Diagnosis not present

## 2020-09-26 DIAGNOSIS — M19041 Primary osteoarthritis, right hand: Secondary | ICD-10-CM

## 2020-09-26 DIAGNOSIS — M5441 Lumbago with sciatica, right side: Secondary | ICD-10-CM | POA: Diagnosis not present

## 2020-09-26 MED ORDER — PREDNISONE 20 MG PO TABS: ORAL_TABLET | ORAL | 0 refills | Status: DC

## 2020-09-26 NOTE — Telephone Encounter (Signed)
Team next step planned was sports medicine- see if he is willing to go through with a referral

## 2020-09-26 NOTE — Telephone Encounter (Signed)
Called and lm for pt tcb. 

## 2020-09-27 LAB — COMPREHENSIVE METABOLIC PANEL
ALT: 17 U/L (ref 0–53)
AST: 19 U/L (ref 0–37)
Albumin: 3.9 g/dL (ref 3.5–5.2)
Alkaline Phosphatase: 56 U/L (ref 39–117)
BUN: 21 mg/dL (ref 6–23)
CO2: 30 mEq/L (ref 19–32)
Calcium: 9.3 mg/dL (ref 8.4–10.5)
Chloride: 103 mEq/L (ref 96–112)
Creatinine, Ser: 1.14 mg/dL (ref 0.40–1.50)
GFR: 65.29 mL/min (ref >60.00)
Glucose, Bld: 90 mg/dL (ref 70–99)
Potassium: 4.1 mEq/L (ref 3.5–5.1)
Sodium: 140 mEq/L (ref 135–145)
Total Bilirubin: 0.5 mg/dL (ref 0.2–1.2)
Total Protein: 6.6 g/dL (ref 6.0–8.3)

## 2020-10-06 NOTE — Progress Notes (Signed)
Subjective:    CC: R-sided low back pain w/ R sciatica  I, Molly Weber, LAT, ATC, am serving as scribe for Dr. Lynne Leader.  HPI: Pt is a 70 y/o male presenting w/ c/o R-sided low back pain and R LE sciatica that flared up in mid-February 2022.  He was seen by his PCP for these c/o on 09/26/20 and was prescribed a prednisone dose pack.  He locates his pain to his R lower back, buttock, R ant thigh and R post knee/calf when he has it but currently has no pain.  Patient has been seen several times by his primary care provider for this including on January 31 where he was started on physician directed conservative management.  Radiating pain: yes into his R thigh R LE numbness/tingling: yes in R LE at R ant thigh and post knee/calf R LE weakness: yes Aggravating factors: standing; walking;  Treatments tried: prednisone dose pack x 2; Meloxicam; chiropractor  Diagnostic imaging: L-spine XRs at his chiropractor  Pertinent review of Systems: No fevers or chills  Relevant historical information: Hypertension, coronary disease, depression   Objective:    Vitals:   10/09/20 1034  BP: 140/78  Pulse: 82  SpO2: 97%   General: Well Developed, well nourished, and in no acute distress.   MSK: L-spine normal. Nontender midline. Range of motion limited extension normal flexion and rotation. Lower extremity reflex and sensation are equal normal throughout.  Negative slump test. Strength decreased knee extension 4/5 right knee normal otherwise. Normal gait.  Lab and Radiology Results  X-ray images L-spine obtained today personally independently interpreted Altered level DDD and facet DJD.  No acute fractures or significant malalignment Await formal radiology review   Impression and Recommendations:    Assessment and Plan: 70 y.o. male with chronic back pain with right L3 radiculopathy with right leg weakness to knee extension.  This is been ongoing for months not improving  with conservative management.  Patient has had physician directed conservative management for at least 6 weeks with little benefit.  Additionally he has had chiropractic care for at least 6 weeks with little benefit.  At this point patient will likely benefit from epidural steroid injection.  Plan for MRI to further characterize cause of pain and for epidural steroid injection planning anticipate right L3.  Discussed diagnosis and plan with patient proceed with MRI recheck after MRI. Prescribe gabapentin and backup course of prednisone for use as needed.Marland Kitchen  PDMP not reviewed this encounter. Orders Placed This Encounter  Procedures  . DG Lumbar Spine 2-3 Views    Standing Status:   Future    Number of Occurrences:   1    Standing Expiration Date:   10/09/2021    Order Specific Question:   Reason for Exam (SYMPTOM  OR DIAGNOSIS REQUIRED)    Answer:   eval right L3 lumbar rad    Order Specific Question:   Preferred imaging location?    Answer:   [facility]  . MR Lumbar Spine Wo Contrast    Standing Status:   Future    Standing Expiration Date:   10/09/2021    Order Specific Question:   What is the patient's sedation requirement?    Answer:   No Sedation    Order Specific Question:   Does the patient have a pacemaker or implanted devices?    Answer:   No    Order Specific Question:   Preferred imaging location?    Answer:  [facility] (table limit-350lbs)   Meds ordered this encounter  Medications  . gabapentin (NEURONTIN) 300 MG capsule    Sig: Take 1 capsule (300 mg total) by mouth 3 (three) times daily as needed.    Dispense:  90 capsule    Refill:  3  . predniSONE (DELTASONE) 20 MG tablet    Sig: Take 2 pills for 2 days, 1 pill for 8 days    Dispense:  12 tablet    Refill:  0    Discussed warning signs or symptoms. Please see discharge instructions. Patient expresses understanding.   The above documentation has been reviewed and is accurate and complete Lynne Leader, M.D.

## 2020-10-09 ENCOUNTER — Other Ambulatory Visit: Payer: Self-pay

## 2020-10-09 ENCOUNTER — Ambulatory Visit (INDEPENDENT_AMBULATORY_CARE_PROVIDER_SITE_OTHER): Payer: Medicare Other | Admitting: Family Medicine

## 2020-10-09 ENCOUNTER — Ambulatory Visit (INDEPENDENT_AMBULATORY_CARE_PROVIDER_SITE_OTHER): Payer: Medicare Other

## 2020-10-09 VITALS — BP 140/78 | HR 82 | Ht 67.0 in | Wt 219.4 lb

## 2020-10-09 DIAGNOSIS — M5416 Radiculopathy, lumbar region: Secondary | ICD-10-CM

## 2020-10-09 DIAGNOSIS — I2510 Atherosclerotic heart disease of native coronary artery without angina pectoris: Secondary | ICD-10-CM | POA: Diagnosis not present

## 2020-10-09 DIAGNOSIS — M545 Low back pain, unspecified: Secondary | ICD-10-CM | POA: Diagnosis not present

## 2020-10-09 MED ORDER — GABAPENTIN 300 MG PO CAPS: 300.0000 mg | ORAL_CAPSULE | Freq: Three times a day (TID) | ORAL | 3 refills | Status: DC | PRN

## 2020-10-09 MED ORDER — PREDNISONE 20 MG PO TABS: ORAL_TABLET | ORAL | 0 refills | Status: DC

## 2020-10-09 NOTE — Patient Instructions (Signed)
Thank you for coming in today.  Please get an Xray today before you leave  You should hear from MRI scheduling within 1 week. If you do not hear please let me know.   Recheck following MRI.   Use gabapentin sparingly for nerve pain. It may make you sleepy

## 2020-10-10 NOTE — Progress Notes (Signed)
X-ray lumbar spine shows multilevel arthritis changes.  This will be better seen on MRI.

## 2020-10-15 ENCOUNTER — Ambulatory Visit (INDEPENDENT_AMBULATORY_CARE_PROVIDER_SITE_OTHER): Payer: Medicare Other

## 2020-10-15 ENCOUNTER — Other Ambulatory Visit: Payer: Self-pay

## 2020-10-15 DIAGNOSIS — M5416 Radiculopathy, lumbar region: Secondary | ICD-10-CM | POA: Diagnosis not present

## 2020-10-15 DIAGNOSIS — M545 Low back pain, unspecified: Secondary | ICD-10-CM | POA: Diagnosis not present

## 2020-10-16 ENCOUNTER — Ambulatory Visit: Payer: Medicare Other | Admitting: Family Medicine

## 2020-10-16 NOTE — Progress Notes (Signed)
MRI lumbar spine shows medium left and severe right narrowing at L3-L4 that could cause symptoms at the level that you are experiencing.  Mild spinal narrowing throughout majority of lumbar spine that could cause problems.  Additionally problems at L4-L5 on the left side are present as well.  Schedule follow-up appoint with me in the near future to go over these results and discuss treatment plan and options.

## 2020-10-17 ENCOUNTER — Telehealth: Payer: Self-pay

## 2020-10-17 NOTE — Progress Notes (Signed)
° ° °  Chronic Care Management Pharmacy Assistant   Name: William Park  MRN: [id number] DOB: [birth year]    Reason for Encounter: Medication Coordination   Medications: Outpatient Encounter Medications as of 10/17/2020  Medication Sig   aspirin 81 MG tablet Take 81 mg by mouth daily.   busPIRone (BUSPAR) 5 MG tablet Take 1 tablet (5 mg total) by mouth 3 (three) times daily as needed.   famotidine (PEPCID) 20 MG tablet Take 1 tablet (20 mg total) by mouth 2 (two) times daily.   fluticasone (FLONASE) 50 MCG/ACT nasal spray SPRAY 2 SPRAYS INTO EACH NOSTRIL ONCE DAILY   gabapentin (NEURONTIN) 300 MG capsule Take 1 capsule (300 mg total) by mouth 3 (three) times daily as needed.   losartan (COZAAR) 50 MG tablet Take 1 tablet (50 mg total) by mouth daily.   meloxicam (MOBIC) 15 MG tablet Take 1 tablet (15 mg total) by mouth daily. (Patient not taking: Reported on 10/09/2020)   Multiple Vitamin (MULTIVITAMIN) tablet Take 1 tablet by mouth daily.   omeprazole (PRILOSEC) 40 MG capsule TAKE (1) CAPSULE BY MOUTH ONCE DAILY 30 MINTUES BEFORE BREAKFAST.   predniSONE (DELTASONE) 20 MG tablet Take 2 pills for 2 days, 1 pill for 8 days   sildenafil (VIAGRA) 100 MG tablet Take 1 tablet (100 mg total) by mouth daily as needed for erectile dysfunction.   simvastatin (ZOCOR) 80 MG tablet Take 1 tablet (80 mg total) by mouth daily.   No facility-administered encounter medications on file as of 10/17/2020.    Reviewed chart for medication changes ahead of medication coordination call.  No OVs, Consults, or hospital visits since last care coordination call/Pharmacist visit. (If appropriate, list visit date, provider name)  No medication changes indicated OR if recent visit, treatment plan here.  BP Readings from Last 3 Encounters:  10/09/20 140/78  09/26/20 (!) 142/74  08/28/20 134/86    No results found for: HGBA1C   Patient obtains medications through [facility]  90 Days   Last  adherence delivery (medication name and frequency) Omeprazole 40 mg  Take one capsule by mouth once daily 30 minutes before breakfast   Patient is due for next adherence delivery on: [id number] Called patient and reviewed medications and coordinated delivery.  This delivery to include: Omeprazole 40 mg Take one capsule by mouth once daily  Simvastatin 80 mg Take one tablet by mouth once daily Losartan 50 mg Take one tab by mouth once daily Famotidine 20 mg Take one tab by mouth twice daily  Confirmed delivery date of 10-24-20, advised patient that pharmacy will contact them the morning of delivery.   Georgiana Shore ,[place] Pharmacist Assistant [phone]

## 2020-10-18 NOTE — Progress Notes (Signed)
I, Peterson Lombard, LAT, ATC acting as a scribe for Lynne Leader, MD.  William Park is a 70 y.o. male who presents to [place] at [facility] today for R-sided low back pain and R LE radiculopathy that flared up in mid-February 2022. Pt was last seen by Dr. Georgina Snell on 10/09/20 for lumbar radiculopathy and was advised to plan for MRI to further characterize cause of pain and for epidural steroid injection planning. Today, pt reports prednisone is really helping his pain.  Pain was thought to be due to right L3 radiculopathy.  Radiating pain: yes into his R thigh R LE numbness/tingling: yes in R LE at R ant thigh and post knee/calf R LE weakness: yes Aggravating factors: standing; walking;  Treatments tried: prednisone dose pack x 2; Meloxicam; chiropractor  Dx imaging: 10/15/20 L-spine MRI  10/09/20 L-spine XR   Pertinent review of systems: No fevers or chills  Relevant historical information: Hypertension.  History of depression.   Exam:  BP 112/84 (BP Location: Right Arm, Patient Position: Sitting, Cuff Size: Normal)    Pulse 67    Ht 5\' 7"  (1.702 m)    Wt 218 lb 3.2 oz (99 kg)    SpO2 94%    BMI 34.17 kg/m  General: Well Developed, well nourished, and in no acute distress.   MSK: Right leg normal-appearing nontender normal strength. Lumbar spine decreased motion intact strength of extremities noted above.    Lab and Radiology Results  EXAM: MRI LUMBAR SPINE WITHOUT CONTRAST  TECHNIQUE: Multiplanar, multisequence MR imaging of the lumbar spine was performed. No intravenous contrast was administered.  COMPARISON:  10/09/2020.  FINDINGS: Segmentation:  Standard.  Alignment: Trace grade 1 retrolisthesis at the L1-3 levels. Trace grade 1 anterolisthesis at L4-5.  Vertebrae: Modic type 2 endplate degenerative changes. No fracture or aggressive osseous lesion.  Conus medullaris and cauda equina: Conus extends to the L1 level. Conus and  cauda equina appear normal.  Disc levels: Multilevel desiccation and disc space loss.  L1-2: Minimal disc bulge and facet degenerative spurring. Patent spinal canal and neural foramen.  L2-3: Disc bulge and bilateral facet hypertrophy. Mild spinal canal and bilateral neural foraminal narrowing.  L3-4: Minimal disc bulge and bilateral facet hypertrophy. Mild spinal canal, moderate left and severe right neural foraminal narrowing.  L4-5: Disc bulge and bilateral facet hypertrophy. Superimposed superiorly migrated left subarticular extrusion. Small right extraforaminal protrusion. Mild spinal canal, mild right and moderate left neural foraminal narrowing.  L5-S1: Disc bulge and superimposed left paracentral protrusion abutting the descending S1 nerve roots. Bilateral facet hypertrophy. Patent spinal canal and right neural foramen. Mild left neural foraminal narrowing.  Paraspinal and other soft tissues: Negative.  IMPRESSION: Moderate left and severe right neural foraminal narrowing at the L3-4 level.  Superiorly migrated left L4-5 subarticular extrusion with moderate left neural foraminal narrowing.  Otherwise mild spinal canal/neural foraminal narrowing at the L2-S1 levels.   Electronically Signed   By: Primitivo Gauze M.D.   On: 10/16/2020 09:30  I, Lynne Leader, personally (independently) visualized and performed the interpretation of the images attached in this note.     Assessment and Plan: 70 y.o. male with right leg lumbar radiculopathy at L3.  Corresponds to findings on MRI as above.  Patient is receiving significant benefit from oral steroids however this is not a long-term plan.  Plan for epidural steroid injection as noted below.  Discussed MRI findings and treatment plan with patient.  Recheck back as needed.  PDMP not reviewed this encounter. Orders Placed This Encounter  Procedures   DG INJECT DIAG/THERA/INC NEEDLE/CATH/PLC EPI/LUMB/SAC  W/IMG    Standing Status:   Future    Standing Expiration Date:   10/19/2021    Order Specific Question:   Reason for Exam (SYMPTOM  OR DIAGNOSIS REQUIRED)    Answer:   rt L3 radiculopathy. Level and technique per radiology    Order Specific Question:   Preferred Imaging Location?    Answer:   [id number]    Order Specific Question:   Radiology Contrast Protocol - do NOT remove file path    Answer:   \[email or web address]   No orders of the defined types were placed in this encounter.    Discussed warning signs or symptoms. Please see discharge instructions. Patient expresses understanding.   The above documentation has been reviewed and is accurate and complete Lynne Leader, M.D.  Total encounter time 20 minutes including face-to-face time with the patient and, reviewing past medical record, and charting on the date of service.   Reviewed MRI treatment plan and options.

## 2020-10-19 ENCOUNTER — Ambulatory Visit (INDEPENDENT_AMBULATORY_CARE_PROVIDER_SITE_OTHER): Payer: Medicare Other | Admitting: Family Medicine

## 2020-10-19 ENCOUNTER — Other Ambulatory Visit: Payer: Self-pay

## 2020-10-19 VITALS — BP 112/84 | HR 67 | Ht 67.0 in | Wt 218.2 lb

## 2020-10-19 DIAGNOSIS — M5416 Radiculopathy, lumbar region: Secondary | ICD-10-CM

## 2020-10-19 DIAGNOSIS — I2510 Atherosclerotic heart disease of native coronary artery without angina pectoris: Secondary | ICD-10-CM

## 2020-10-19 NOTE — Patient Instructions (Signed)
Thank you for coming in today.  Please call [facility] at [phone] to schedule your spine injection.   STOP Asprin now.   Epidural Steroid Injection Patient Information  Description: The epidural space surrounds the nerves as they exit the spinal cord.  In some patients, the nerves can be compressed and inflamed by a bulging disc or a tight spinal canal (spinal stenosis).  By injecting steroids into the epidural space, we can bring irritated nerves into direct contact with a potentially helpful medication.  These steroids act directly on the irritated nerves and can reduce swelling and inflammation which often leads to decreased pain.  Epidural steroids may be injected anywhere along the spine and from the neck to the low back depending upon the location of your pain.   After numbing the skin with local anesthetic (like Novocaine), a small needle is passed into the epidural space slowly.  You may experience a sensation of pressure while this is being done.  The entire block usually last less than 10 minutes.  Conditions which may be treated by epidural steroids:   Low back and leg pain  Neck and arm pain  Spinal stenosis  Post-laminectomy syndrome  Herpes zoster (shingles) pain  Pain from compression fractures  Preparation for the injection:  1. Do not eat any solid food or dairy products within 8 hours of your appointment.  2. You may drink clear liquids up to 3 hours before appointment.  Clear liquids include water, black coffee, juice or soda.  No milk or cream please. 3. You may take your regular medication, including pain medications, with a sip of water before your appointment  Diabetics should hold regular insulin (if taken separately) and take 1/2 normal NPH dos the morning of the procedure.  Carry some sugar containing items with you to your appointment. 4. A driver must accompany you and be prepared to drive you home after your procedure.  5. Bring all your  current medications with your. 6. An IV may be inserted and sedation may be given at the discretion of the physician.   7. A blood pressure cuff, EKG and other monitors will often be applied during the procedure.  Some patients may need to have extra oxygen administered for a short period. 8. You will be asked to provide medical information, including your allergies, prior to the procedure.  We must know immediately if you are taking blood thinners (like Coumadin/Warfarin)  Or if you are allergic to IV iodine contrast (dye). We must know if you could possible be pregnant.  Possible side-effects:  Bleeding from needle site  Infection (rare, may require surgery)  Nerve injury (rare)  Numbness & tingling (temporary)  Difficulty urinating (rare, temporary)  Spinal headache ( a headache worse with upright posture)  Light -headedness (temporary)  Pain at injection site (several days)  Decreased blood pressure (temporary)  Weakness in arm/leg (temporary)  Pressure sensation in back/neck (temporary)  Call if you experience:  Fever/chills associated with headache or increased back/neck pain.  Headache worsened by an upright position.  New onset weakness or numbness of an extremity below the injection site  Hives or difficulty breathing (go to the emergency room)  Inflammation or drainage at the infection site  Severe back/neck pain  Any new symptoms which are concerning to you  Please note:  Although the local anesthetic injected can often make your back or neck feel good for several hours after the injection, the pain will likely return.  It takes  3-7 days for steroids to work in the epidural space.  You may not notice any pain relief for at least that one week.  If effective, we will often do a series of three injections spaced 3-6 weeks apart to maximally decrease your pain.  After the initial series, we generally will wait several months before considering a repeat  injection of the same type.

## 2020-10-23 DIAGNOSIS — R0789 Other chest pain: Secondary | ICD-10-CM | POA: Diagnosis not present

## 2020-10-23 DIAGNOSIS — Z87891 Personal history of nicotine dependence: Secondary | ICD-10-CM | POA: Diagnosis not present

## 2020-10-23 DIAGNOSIS — R0902 Hypoxemia: Secondary | ICD-10-CM | POA: Diagnosis not present

## 2020-10-23 DIAGNOSIS — I10 Essential (primary) hypertension: Secondary | ICD-10-CM | POA: Diagnosis not present

## 2020-10-23 DIAGNOSIS — R079 Chest pain, unspecified: Secondary | ICD-10-CM | POA: Diagnosis not present

## 2020-10-25 ENCOUNTER — Ambulatory Visit
Admission: RE | Admit: 2020-10-25 | Discharge: 2020-10-25 | Disposition: A | Discharge status: Home / Self Care | Payer: Medicare Other | Source: Ambulatory Visit | Attending: Family Medicine | Admitting: Family Medicine

## 2020-10-25 DIAGNOSIS — M5416 Radiculopathy, lumbar region: Secondary | ICD-10-CM

## 2020-10-25 DIAGNOSIS — M47817 Spondylosis without myelopathy or radiculopathy, lumbosacral region: Secondary | ICD-10-CM | POA: Diagnosis not present

## 2020-10-25 MED ORDER — METHYLPREDNISOLONE ACETATE 40 MG/ML INJ SUSP (RADIOLOG
120.0000 mg | Freq: Once | INTRAMUSCULAR | Status: AC
  Administered 2020-10-25: 120 mg via EPIDURAL

## 2020-10-25 MED ORDER — IOPAMIDOL (ISOVUE-M 200) INJECTION 41%
1.0000 mL | Freq: Once | INTRAMUSCULAR | Status: AC
  Administered 2020-10-25: 1 mL via EPIDURAL

## 2020-10-25 NOTE — Discharge Instructions (Signed)

## 2020-11-09 ENCOUNTER — Telehealth: Payer: Self-pay | Admitting: Family Medicine

## 2020-11-09 DIAGNOSIS — M5416 Radiculopathy, lumbar region: Secondary | ICD-10-CM

## 2020-11-09 NOTE — Telephone Encounter (Signed)
Pt had epi 3/30, has had no improvement. [place] when still but leg pain constant when walking, which he does a lot of at work.    Should he have another injection? OV with [place]? Please call pt, ok to leave detailed message as he is working today.

## 2020-11-09 NOTE — Telephone Encounter (Signed)
Plan to repeat epidural steroid injection.  If not better after the second shot neck step would be consultation with neurosurgery or orthopedic spine surgery.  Please contact [facility] to schedule the injection.  Phone number is [phone].

## 2020-11-09 NOTE — Telephone Encounter (Signed)
Called pt and relayed Dr. Clovis Riley message regarding next steps.  Pt verbalizes understanding.

## 2020-11-20 ENCOUNTER — Other Ambulatory Visit: Payer: Self-pay

## 2020-11-20 ENCOUNTER — Other Ambulatory Visit: Payer: Self-pay | Admitting: Family Medicine

## 2020-11-20 ENCOUNTER — Ambulatory Visit
Admission: RE | Admit: 2020-11-20 | Discharge: 2020-11-20 | Disposition: A | Discharge status: Home / Self Care | Payer: Medicare Other | Source: Ambulatory Visit | Attending: Family Medicine | Admitting: Family Medicine

## 2020-11-20 DIAGNOSIS — M5416 Radiculopathy, lumbar region: Secondary | ICD-10-CM

## 2020-11-20 MED ORDER — IOPAMIDOL (ISOVUE-M 200) INJECTION 41%
1.0000 mL | Freq: Once | INTRAMUSCULAR | Status: AC
  Administered 2020-11-20: 1 mL via EPIDURAL

## 2020-11-20 MED ORDER — METHYLPREDNISOLONE ACETATE 40 MG/ML INJ SUSP (RADIOLOG
80.0000 mg | Freq: Once | INTRAMUSCULAR | Status: AC
  Administered 2020-11-20: 80 mg via EPIDURAL

## 2020-11-20 NOTE — Discharge Instructions (Signed)

## 2020-11-23 ENCOUNTER — Telehealth: Payer: Self-pay | Admitting: Family Medicine

## 2020-11-23 DIAGNOSIS — M5416 Radiculopathy, lumbar region: Secondary | ICD-10-CM

## 2020-11-23 NOTE — Telephone Encounter (Signed)
Patient called stating that after having his epidural, he has not noticed any improvement. He asked if he should now proceed with neurosurgery or orthopedic spine surgery as mentioned previously. If so, can this referral be put in for him?  Please advise.

## 2020-11-24 NOTE — Telephone Encounter (Signed)
Orthopedic spine surgery referral placed.  It may be worthwhile trying a second epidural steroid injection.  I would recommend it while you are in the process of being scheduled for surgery evaluation.  Would you like me to go ahead and place a second epidural steroid injection order?

## 2020-11-24 NOTE — Telephone Encounter (Signed)
Called pt and advised him that he's been referred to see Dr. Lynann Bologna and he verbalizes understanding.  Pt has already had 2 epidurals and is not interested in a 3rd.  I advise pt to let us know if he doesn't hear from [facility]'s office w/I the next week about scheduling.

## 2020-12-11 ENCOUNTER — Telehealth: Payer: Self-pay

## 2020-12-11 DIAGNOSIS — I2510 Atherosclerotic heart disease of native coronary artery without angina pectoris: Secondary | ICD-10-CM

## 2020-12-11 DIAGNOSIS — M5416 Radiculopathy, lumbar region: Secondary | ICD-10-CM | POA: Diagnosis not present

## 2020-12-11 NOTE — Telephone Encounter (Signed)
Patient states that he currently has a cardiologist in [place].    States that he has spoken with Dr. Yong Channel about getting a referral to a cardiologist in [facility].    Patient has to have a surgery clearance  and would like to know if a referral can be sent to a cardiologist here in [place].    Please advise.

## 2020-12-11 NOTE — Telephone Encounter (Signed)
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

## 2020-12-11 NOTE — Telephone Encounter (Signed)
Yes thanks- may refer to [facility] cardiology

## 2020-12-13 NOTE — Telephone Encounter (Signed)
Referral has been placed. 

## 2020-12-13 NOTE — Telephone Encounter (Signed)
Coronary artery disease

## 2020-12-13 NOTE — Telephone Encounter (Signed)
What to place the referral under?

## 2020-12-19 DIAGNOSIS — S335XXA Sprain of ligaments of lumbar spine, initial encounter: Secondary | ICD-10-CM | POA: Diagnosis not present

## 2020-12-19 DIAGNOSIS — M9902 Segmental and somatic dysfunction of thoracic region: Secondary | ICD-10-CM | POA: Diagnosis not present

## 2020-12-19 DIAGNOSIS — S134XXA Sprain of ligaments of cervical spine, initial encounter: Secondary | ICD-10-CM | POA: Diagnosis not present

## 2020-12-19 DIAGNOSIS — S233XXA Sprain of ligaments of thoracic spine, initial encounter: Secondary | ICD-10-CM | POA: Diagnosis not present

## 2020-12-19 DIAGNOSIS — M9901 Segmental and somatic dysfunction of cervical region: Secondary | ICD-10-CM | POA: Diagnosis not present

## 2020-12-19 DIAGNOSIS — M9903 Segmental and somatic dysfunction of lumbar region: Secondary | ICD-10-CM | POA: Diagnosis not present

## 2020-12-22 DIAGNOSIS — M9901 Segmental and somatic dysfunction of cervical region: Secondary | ICD-10-CM | POA: Diagnosis not present

## 2020-12-22 DIAGNOSIS — S233XXA Sprain of ligaments of thoracic spine, initial encounter: Secondary | ICD-10-CM | POA: Diagnosis not present

## 2020-12-22 DIAGNOSIS — S134XXA Sprain of ligaments of cervical spine, initial encounter: Secondary | ICD-10-CM | POA: Diagnosis not present

## 2020-12-22 DIAGNOSIS — M9902 Segmental and somatic dysfunction of thoracic region: Secondary | ICD-10-CM | POA: Diagnosis not present

## 2020-12-22 DIAGNOSIS — S335XXA Sprain of ligaments of lumbar spine, initial encounter: Secondary | ICD-10-CM | POA: Diagnosis not present

## 2020-12-22 DIAGNOSIS — M9903 Segmental and somatic dysfunction of lumbar region: Secondary | ICD-10-CM | POA: Diagnosis not present

## 2020-12-29 DIAGNOSIS — S134XXA Sprain of ligaments of cervical spine, initial encounter: Secondary | ICD-10-CM | POA: Diagnosis not present

## 2020-12-29 DIAGNOSIS — M9902 Segmental and somatic dysfunction of thoracic region: Secondary | ICD-10-CM | POA: Diagnosis not present

## 2020-12-29 DIAGNOSIS — S233XXA Sprain of ligaments of thoracic spine, initial encounter: Secondary | ICD-10-CM | POA: Diagnosis not present

## 2020-12-29 DIAGNOSIS — M9901 Segmental and somatic dysfunction of cervical region: Secondary | ICD-10-CM | POA: Diagnosis not present

## 2020-12-29 DIAGNOSIS — S335XXA Sprain of ligaments of lumbar spine, initial encounter: Secondary | ICD-10-CM | POA: Diagnosis not present

## 2020-12-29 DIAGNOSIS — M9903 Segmental and somatic dysfunction of lumbar region: Secondary | ICD-10-CM | POA: Diagnosis not present

## 2021-01-01 DIAGNOSIS — S134XXA Sprain of ligaments of cervical spine, initial encounter: Secondary | ICD-10-CM | POA: Diagnosis not present

## 2021-01-01 DIAGNOSIS — S233XXA Sprain of ligaments of thoracic spine, initial encounter: Secondary | ICD-10-CM | POA: Diagnosis not present

## 2021-01-01 DIAGNOSIS — M9901 Segmental and somatic dysfunction of cervical region: Secondary | ICD-10-CM | POA: Diagnosis not present

## 2021-01-01 DIAGNOSIS — S335XXA Sprain of ligaments of lumbar spine, initial encounter: Secondary | ICD-10-CM | POA: Diagnosis not present

## 2021-01-01 DIAGNOSIS — M9903 Segmental and somatic dysfunction of lumbar region: Secondary | ICD-10-CM | POA: Diagnosis not present

## 2021-01-01 DIAGNOSIS — M9902 Segmental and somatic dysfunction of thoracic region: Secondary | ICD-10-CM | POA: Diagnosis not present

## 2021-01-08 DIAGNOSIS — S233XXA Sprain of ligaments of thoracic spine, initial encounter: Secondary | ICD-10-CM | POA: Diagnosis not present

## 2021-01-08 DIAGNOSIS — M9903 Segmental and somatic dysfunction of lumbar region: Secondary | ICD-10-CM | POA: Diagnosis not present

## 2021-01-08 DIAGNOSIS — S134XXA Sprain of ligaments of cervical spine, initial encounter: Secondary | ICD-10-CM | POA: Diagnosis not present

## 2021-01-08 DIAGNOSIS — M9901 Segmental and somatic dysfunction of cervical region: Secondary | ICD-10-CM | POA: Diagnosis not present

## 2021-01-08 DIAGNOSIS — M9902 Segmental and somatic dysfunction of thoracic region: Secondary | ICD-10-CM | POA: Diagnosis not present

## 2021-01-08 DIAGNOSIS — M5441 Lumbago with sciatica, right side: Secondary | ICD-10-CM | POA: Diagnosis not present

## 2021-01-09 ENCOUNTER — Telehealth: Payer: Medicare Other

## 2021-01-10 DIAGNOSIS — B358 Other dermatophytoses: Secondary | ICD-10-CM | POA: Diagnosis not present

## 2021-01-10 DIAGNOSIS — S134XXA Sprain of ligaments of cervical spine, initial encounter: Secondary | ICD-10-CM | POA: Diagnosis not present

## 2021-01-10 DIAGNOSIS — M9903 Segmental and somatic dysfunction of lumbar region: Secondary | ICD-10-CM | POA: Diagnosis not present

## 2021-01-10 DIAGNOSIS — L304 Erythema intertrigo: Secondary | ICD-10-CM | POA: Diagnosis not present

## 2021-01-10 DIAGNOSIS — M9902 Segmental and somatic dysfunction of thoracic region: Secondary | ICD-10-CM | POA: Diagnosis not present

## 2021-01-10 DIAGNOSIS — B354 Tinea corporis: Secondary | ICD-10-CM | POA: Diagnosis not present

## 2021-01-10 DIAGNOSIS — M9901 Segmental and somatic dysfunction of cervical region: Secondary | ICD-10-CM | POA: Diagnosis not present

## 2021-01-10 DIAGNOSIS — S233XXA Sprain of ligaments of thoracic spine, initial encounter: Secondary | ICD-10-CM | POA: Diagnosis not present

## 2021-01-10 DIAGNOSIS — M5441 Lumbago with sciatica, right side: Secondary | ICD-10-CM | POA: Diagnosis not present

## 2021-01-10 DIAGNOSIS — L578 Other skin changes due to chronic exposure to nonionizing radiation: Secondary | ICD-10-CM | POA: Diagnosis not present

## 2021-01-12 ENCOUNTER — Other Ambulatory Visit: Payer: Self-pay | Admitting: Family Medicine

## 2021-01-12 DIAGNOSIS — M9903 Segmental and somatic dysfunction of lumbar region: Secondary | ICD-10-CM | POA: Diagnosis not present

## 2021-01-12 DIAGNOSIS — M5441 Lumbago with sciatica, right side: Secondary | ICD-10-CM | POA: Diagnosis not present

## 2021-01-12 DIAGNOSIS — S233XXA Sprain of ligaments of thoracic spine, initial encounter: Secondary | ICD-10-CM | POA: Diagnosis not present

## 2021-01-12 DIAGNOSIS — M9902 Segmental and somatic dysfunction of thoracic region: Secondary | ICD-10-CM | POA: Diagnosis not present

## 2021-01-12 DIAGNOSIS — S134XXA Sprain of ligaments of cervical spine, initial encounter: Secondary | ICD-10-CM | POA: Diagnosis not present

## 2021-01-12 DIAGNOSIS — M9901 Segmental and somatic dysfunction of cervical region: Secondary | ICD-10-CM | POA: Diagnosis not present

## 2021-01-15 ENCOUNTER — Telehealth: Payer: Self-pay

## 2021-01-15 DIAGNOSIS — S335XXA Sprain of ligaments of lumbar spine, initial encounter: Secondary | ICD-10-CM | POA: Diagnosis not present

## 2021-01-15 DIAGNOSIS — M9901 Segmental and somatic dysfunction of cervical region: Secondary | ICD-10-CM | POA: Diagnosis not present

## 2021-01-15 DIAGNOSIS — M9902 Segmental and somatic dysfunction of thoracic region: Secondary | ICD-10-CM | POA: Diagnosis not present

## 2021-01-15 DIAGNOSIS — S233XXA Sprain of ligaments of thoracic spine, initial encounter: Secondary | ICD-10-CM | POA: Diagnosis not present

## 2021-01-15 DIAGNOSIS — S134XXA Sprain of ligaments of cervical spine, initial encounter: Secondary | ICD-10-CM | POA: Diagnosis not present

## 2021-01-15 DIAGNOSIS — M9903 Segmental and somatic dysfunction of lumbar region: Secondary | ICD-10-CM | POA: Diagnosis not present

## 2021-01-15 NOTE — Chronic Care Management (AMB) (Signed)
    Chronic Care Management Pharmacy Assistant   Name: William Park  MRN: [id number] DOB: [birth year]  Reason for Encounter: Medication Coordination Call  Recent office visits:  No visits noted  Recent consult visits:  10/19/20- Lynne Leader, MD (Sports Medicine)- seen for lumbar radiculopathy, plan for epidural steroid injection, visit noted discontinued aspirin, no follow up documented  Hospital visits:  None in previous 6 months  Medications: Outpatient Encounter Medications as of 01/15/2021  Medication Sig   aspirin 81 MG tablet Take 81 mg by mouth daily.   busPIRone (BUSPAR) 5 MG tablet Take 1 tablet (5 mg total) by mouth 3 (three) times daily as needed.   famotidine (PEPCID) 20 MG tablet Take 1 tablet (20 mg total) by mouth 2 (two) times daily.   fluticasone (FLONASE) 50 MCG/ACT nasal spray SPRAY 2 SPRAYS INTO EACH NOSTRIL ONCE DAILY   gabapentin (NEURONTIN) 300 MG capsule Take 1 capsule (300 mg total) by mouth 3 (three) times daily as needed.   losartan (COZAAR) 50 MG tablet Take 1 tablet (50 mg total) by mouth daily.   meloxicam (MOBIC) 15 MG tablet Take 1 tablet (15 mg total) by mouth daily. (Patient not taking: Reported on 10/09/2020)   Multiple Vitamin (MULTIVITAMIN) tablet Take 1 tablet by mouth daily.   omeprazole (PRILOSEC) 40 MG capsule TAKE (1) CAPSULE BY MOUTH ONCE DAILY 30 MINTUES BEFORE BREAKFAST.   predniSONE (DELTASONE) 20 MG tablet Take 2 pills for 2 days, 1 pill for 8 days   sildenafil (VIAGRA) 100 MG tablet Take 1 tablet (100 mg total) by mouth daily as needed for erectile dysfunction.   simvastatin (ZOCOR) 80 MG tablet TAKE ONE TABLET BY MOUTH ONCE DAILY   No facility-administered encounter medications on file as of 01/15/2021.   Reviewed chart for medication changes ahead of medication coordination call.  No OVs, Consults, or hospital visits since last care coordination call/Pharmacist visit. (If appropriate, list visit date, provider name)  No  medication changes indicated OR if recent visit, treatment plan here.  BP Readings from Last 3 Encounters:  11/20/20 131/71  10/25/20 (!) 164/80  10/19/20 112/84    No results found for: HGBA1C   Patient obtains medications through Vials  90 Days   Last adherence delivery included:  Omeprazole 40 mg Take one capsule by mouth once daily Simvastatin 80 mg Take one tablet by mouth once daily Losartan 50 mg Take one tab by mouth once daily Famotidine 20 mg Take one tab by mouth twice daily  Patient is due for next adherence delivery on: 01/23/21 Called patient and reviewed medications and coordinated delivery.  This delivery to include: Omeprazole 40 mg Take one capsule by mouth once daily Losartan 50 mg Take one tab by mouth once daily Famotidine 20 mg Take one tab by mouth twice daily fluticasone 50 MCG/ACT nasal spray Meloxicam 15 mg- Take one tab by mouth once daily  Patient declined the following medications: Simvastatin 80 mg- Patient reported taking 1/2 tab (40 mg) daily instead of 1 tab daily as prescribed and has an abundance of this medication left  Patient needs refills for: Fluticasone nasal spray   Confirmed delivery date of 01/23/21, advised patient that pharmacy will contact them the morning of delivery.  [facility] CPA, CMA

## 2021-01-16 MED ORDER — FLUTICASONE PROPIONATE 50 MCG/ACT NA SUSP: NASAL | 0 refills | Status: DC

## 2021-01-16 NOTE — Addendum Note (Signed)
Addended by: Madelin Rear on: 01/16/2021 07:32 PM   Modules accepted: Orders

## 2021-01-17 DIAGNOSIS — S335XXA Sprain of ligaments of lumbar spine, initial encounter: Secondary | ICD-10-CM | POA: Diagnosis not present

## 2021-01-17 DIAGNOSIS — M9901 Segmental and somatic dysfunction of cervical region: Secondary | ICD-10-CM | POA: Diagnosis not present

## 2021-01-17 DIAGNOSIS — S233XXA Sprain of ligaments of thoracic spine, initial encounter: Secondary | ICD-10-CM | POA: Diagnosis not present

## 2021-01-17 DIAGNOSIS — M9902 Segmental and somatic dysfunction of thoracic region: Secondary | ICD-10-CM | POA: Diagnosis not present

## 2021-01-17 DIAGNOSIS — M9903 Segmental and somatic dysfunction of lumbar region: Secondary | ICD-10-CM | POA: Diagnosis not present

## 2021-01-17 DIAGNOSIS — S134XXA Sprain of ligaments of cervical spine, initial encounter: Secondary | ICD-10-CM | POA: Diagnosis not present

## 2021-01-19 DIAGNOSIS — M9903 Segmental and somatic dysfunction of lumbar region: Secondary | ICD-10-CM | POA: Diagnosis not present

## 2021-01-19 DIAGNOSIS — M9902 Segmental and somatic dysfunction of thoracic region: Secondary | ICD-10-CM | POA: Diagnosis not present

## 2021-01-19 DIAGNOSIS — M9901 Segmental and somatic dysfunction of cervical region: Secondary | ICD-10-CM | POA: Diagnosis not present

## 2021-01-19 DIAGNOSIS — S134XXA Sprain of ligaments of cervical spine, initial encounter: Secondary | ICD-10-CM | POA: Diagnosis not present

## 2021-01-19 DIAGNOSIS — S335XXA Sprain of ligaments of lumbar spine, initial encounter: Secondary | ICD-10-CM | POA: Diagnosis not present

## 2021-01-19 DIAGNOSIS — S233XXA Sprain of ligaments of thoracic spine, initial encounter: Secondary | ICD-10-CM | POA: Diagnosis not present

## 2021-01-22 ENCOUNTER — Telehealth: Payer: Self-pay

## 2021-01-22 DIAGNOSIS — Z125 Encounter for screening for malignant neoplasm of prostate: Secondary | ICD-10-CM | POA: Diagnosis not present

## 2021-01-22 DIAGNOSIS — M5126 Other intervertebral disc displacement, lumbar region: Secondary | ICD-10-CM | POA: Diagnosis not present

## 2021-01-22 DIAGNOSIS — M9903 Segmental and somatic dysfunction of lumbar region: Secondary | ICD-10-CM | POA: Diagnosis not present

## 2021-01-22 DIAGNOSIS — E785 Hyperlipidemia, unspecified: Secondary | ICD-10-CM | POA: Diagnosis not present

## 2021-01-22 DIAGNOSIS — I2510 Atherosclerotic heart disease of native coronary artery without angina pectoris: Secondary | ICD-10-CM | POA: Diagnosis not present

## 2021-01-22 DIAGNOSIS — I10 Essential (primary) hypertension: Secondary | ICD-10-CM | POA: Diagnosis not present

## 2021-01-22 DIAGNOSIS — M48061 Spinal stenosis, lumbar region without neurogenic claudication: Secondary | ICD-10-CM | POA: Diagnosis not present

## 2021-01-22 DIAGNOSIS — N289 Disorder of kidney and ureter, unspecified: Secondary | ICD-10-CM | POA: Diagnosis not present

## 2021-01-22 DIAGNOSIS — M5137 Other intervertebral disc degeneration, lumbosacral region: Secondary | ICD-10-CM | POA: Diagnosis not present

## 2021-01-22 NOTE — Telephone Encounter (Signed)
Error

## 2021-01-26 DIAGNOSIS — M5126 Other intervertebral disc displacement, lumbar region: Secondary | ICD-10-CM | POA: Diagnosis not present

## 2021-01-26 DIAGNOSIS — M5137 Other intervertebral disc degeneration, lumbosacral region: Secondary | ICD-10-CM | POA: Diagnosis not present

## 2021-01-26 DIAGNOSIS — M9903 Segmental and somatic dysfunction of lumbar region: Secondary | ICD-10-CM | POA: Diagnosis not present

## 2021-01-30 DIAGNOSIS — M79604 Pain in right leg: Secondary | ICD-10-CM | POA: Insufficient documentation

## 2021-01-30 DIAGNOSIS — G8929 Other chronic pain: Secondary | ICD-10-CM | POA: Diagnosis not present

## 2021-01-30 DIAGNOSIS — M5441 Lumbago with sciatica, right side: Secondary | ICD-10-CM | POA: Diagnosis not present

## 2021-01-31 ENCOUNTER — Other Ambulatory Visit (HOSPITAL_COMMUNITY): Payer: Self-pay | Admitting: Internal Medicine

## 2021-01-31 DIAGNOSIS — M79604 Pain in right leg: Secondary | ICD-10-CM

## 2021-02-02 ENCOUNTER — Ambulatory Visit (INDEPENDENT_AMBULATORY_CARE_PROVIDER_SITE_OTHER)
Admission: RE | Admit: 2021-02-02 | Discharge: 2021-02-02 | Disposition: A | Discharge status: Home / Self Care | Payer: Medicare Other | Source: Ambulatory Visit | Attending: Neurosurgery | Admitting: Neurosurgery

## 2021-02-02 ENCOUNTER — Ambulatory Visit (HOSPITAL_COMMUNITY)
Admission: RE | Admit: 2021-02-02 | Discharge: 2021-02-02 | Disposition: A | Discharge status: Home / Self Care | Payer: Medicare Other | Source: Ambulatory Visit | Attending: Neurosurgery | Admitting: Neurosurgery

## 2021-02-02 ENCOUNTER — Other Ambulatory Visit: Payer: Self-pay

## 2021-02-02 DIAGNOSIS — M79604 Pain in right leg: Secondary | ICD-10-CM | POA: Diagnosis not present

## 2021-02-15 ENCOUNTER — Other Ambulatory Visit: Payer: Self-pay | Admitting: Neurosurgery

## 2021-02-16 ENCOUNTER — Encounter (HOSPITAL_COMMUNITY): Payer: Self-pay | Admitting: Neurosurgery

## 2021-02-16 ENCOUNTER — Other Ambulatory Visit: Payer: Self-pay

## 2021-02-16 NOTE — Progress Notes (Signed)
Anesthesia Chart Review: Same-day work-up  History of CAD s/p remote angioplasty, most recently in 1994.  He is followed by Dr. Rosalita Chessman in [place].  Most recent stress echo July 2020 showed normal LV size with normal systolic function, no ischemia, low risk study.  Hypertension is followed by PCP Dr. Garret Reddish.  Patient will need day of surgery labs and evaluation.  EKG 01/22/2021 (see copy in chart): Sinus rhythm with frequent PACs.  Rate 70.  Low voltage in limb leads.  Nonspecific T abnormality.  Stress echocardiogram 02/09/2019 (copy on chart): Impression: Negative stress echocardiogram.  Patient achieved 80% of MPHR.  No chest pain to suggest angina no electrocardiogram changes over the baseline suggest myocardial ischemia, ejection fraction augmented from 65 to 75%, all segments contract more vigorously.  Low risk stress test. Normal LV size with normal systolic function.  Ejection fraction 65%. Normal right ventricular size.  William, Park [facility] Stay [facility]/Anesthesiology Phone [phone] 02/16/2021 3:32 PM

## 2021-02-16 NOTE — Anesthesia Preprocedure Evaluation (Addendum)
Anesthesia Evaluation  Patient identified by MRN, date of birth, ID band Patient awake    Reviewed: Allergy & Precautions, NPO status , Patient's Chart, lab work & pertinent test results  Airway Mallampati: II  TM Distance: >3 FB     Dental   Pulmonary former smoker,    breath sounds clear to auscultation       Cardiovascular hypertension, + CAD and + Past MI   Rhythm:Regular Rate:Normal     Neuro/Psych    GI/Hepatic Neg liver ROS, GERD  ,  Endo/Other  negative endocrine ROS  Renal/GU negative Renal ROS     Musculoskeletal  (+) Arthritis ,   Abdominal   Peds  Hematology   Anesthesia Other Findings   Reproductive/Obstetrics                            Anesthesia Physical Anesthesia Plan  ASA: 3  Anesthesia Plan: General   Post-op Pain Management:    Induction:   PONV Risk Score and Plan: 2 and Ondansetron, Dexamethasone and Midazolam  Airway Management Planned: Oral ETT  Additional Equipment:   Intra-op Plan:   Post-operative Plan: Extubation in OR  Informed Consent: I have reviewed the patients History and Physical, chart, labs and discussed the procedure including the risks, benefits and alternatives for the proposed anesthesia with the patient or authorized representative who has indicated his/her understanding and acceptance.     Dental advisory given  Plan Discussed with: CRNA and Anesthesiologist  Anesthesia Plan Comments: (PAT note by Karoline Caldwell, PA-C: History of CAD s/p remote angioplasty, most recently in 1994.  He is followed by Dr. Rosalita Chessman in [place].  Most recent stress echo July 2020 showed normal LV size with normal systolic function, no ischemia, low risk study.  Hypertension is followed by PCP Dr. Garret Reddish.  Patient will need day of surgery labs and evaluation.  EKG 01/22/2021 (see copy in chart): Sinus rhythm with frequent PACs.  Rate 70.   Low voltage in limb leads.  Nonspecific T abnormality.  Stress echocardiogram 02/09/2019 (copy on chart): Impression: Negative stress echocardiogram.  Patient achieved 80% of MPHR.  No chest pain to suggest angina no electrocardiogram changes over the baseline suggest myocardial ischemia, ejection fraction augmented from 65 to 75%, all segments contract more vigorously.  Low risk stress test. Normal LV size with normal systolic function.  Ejection fraction 65%. Normal right ventricular size. )       Anesthesia Quick Evaluation

## 2021-02-16 NOTE — Progress Notes (Signed)
PCP - Dr. Yong Channel Cardiologist - Dr. [name] (new pt appt in Oct) EKG - DOS Chest x-ray -  ECHO - 2014 Cardiac Cath -  CPAP -   Aspirin Instructions: per pt last dose ASA 7/20  COVID TEST- DOS  Anesthesia review: yes  -------------  SDW INSTRUCTIONS:  Your procedure is scheduled on Monday 7/25. Please report to [facility] Main Entrance "A" at 0700 A.M., and check in at the Admitting office. Call this number if you have problems the morning of surgery: [phone]   Remember: Do not eat or drink after midnight the night before your surgery   Medications to take morning of surgery with a sip of water include: busPIRone (BUSPAR)  famotidine (PEPCID)  gabapentin (NEURONTIN)  omeprazole (PRILOSEC)  As of today, STOP taking any meloxicam (MOBIC), Aspirin (unless otherwise instructed by your surgeon), Aleve, Naproxen, Ibuprofen, Motrin, Advil, Goody's, BC's, all herbal medications, fish oil, and all vitamins.    The Morning of Surgery Do not wear jewelry Do not wear lotions, powders, colognes, or deodorant Men may shave face and neck. Do not bring valuables to the hospital. [facility] is not responsible for any belongings or valuables.  If you are a smoker, DO NOT Smoke 24 hours prior to surgery  If you wear a CPAP at night please bring your mask the morning of surgery   Remember that you must have someone to transport you home after your surgery, and remain with you for 24 hours if you are discharged the same day.  Please bring cases for contacts, glasses, hearing aids, dentures or bridgework because it cannot be worn into surgery.   Patients discharged the day of surgery will not be allowed to drive home.   Please shower the NIGHT BEFORE/MORNING OF SURGERY (use antibacterial soap like DIAL soap if possible). Wear comfortable clothes the morning of surgery. Oral Hygiene is also important to reduce your risk of infection.  Remember - BRUSH YOUR TEETH THE MORNING OF SURGERY  WITH YOUR REGULAR TOOTHPASTE  Patient denies shortness of breath, fever, cough and chest pain.

## 2021-02-19 ENCOUNTER — Encounter (HOSPITAL_COMMUNITY)
Admission: RE | Disposition: A | Discharge status: Home / Self Care | Payer: Self-pay | Source: Home / Self Care | Attending: Neurosurgery

## 2021-02-19 ENCOUNTER — Other Ambulatory Visit: Payer: Self-pay

## 2021-02-19 ENCOUNTER — Ambulatory Visit (HOSPITAL_COMMUNITY)
Admission: RE | Admit: 2021-02-19 | Discharge: 2021-02-19 | Disposition: A | Discharge status: Home / Self Care | Payer: Medicare Other | Attending: Neurosurgery | Admitting: Neurosurgery

## 2021-02-19 ENCOUNTER — Ambulatory Visit (HOSPITAL_COMMUNITY): Payer: Medicare Other | Admitting: Physician Assistant

## 2021-02-19 ENCOUNTER — Ambulatory Visit (HOSPITAL_COMMUNITY): Payer: Medicare Other

## 2021-02-19 ENCOUNTER — Encounter (HOSPITAL_COMMUNITY): Payer: Self-pay | Admitting: Neurosurgery

## 2021-02-19 DIAGNOSIS — Z7982 Long term (current) use of aspirin: Secondary | ICD-10-CM | POA: Insufficient documentation

## 2021-02-19 DIAGNOSIS — M5416 Radiculopathy, lumbar region: Secondary | ICD-10-CM | POA: Insufficient documentation

## 2021-02-19 DIAGNOSIS — Z791 Long term (current) use of non-steroidal anti-inflammatories (NSAID): Secondary | ICD-10-CM | POA: Insufficient documentation

## 2021-02-19 DIAGNOSIS — Z9861 Coronary angioplasty status: Secondary | ICD-10-CM | POA: Diagnosis not present

## 2021-02-19 DIAGNOSIS — I10 Essential (primary) hypertension: Secondary | ICD-10-CM | POA: Diagnosis not present

## 2021-02-19 DIAGNOSIS — Z20822 Contact with and (suspected) exposure to covid-19: Secondary | ICD-10-CM | POA: Diagnosis not present

## 2021-02-19 DIAGNOSIS — Z87891 Personal history of nicotine dependence: Secondary | ICD-10-CM | POA: Diagnosis not present

## 2021-02-19 DIAGNOSIS — M48062 Spinal stenosis, lumbar region with neurogenic claudication: Secondary | ICD-10-CM | POA: Insufficient documentation

## 2021-02-19 DIAGNOSIS — Z79899 Other long term (current) drug therapy: Secondary | ICD-10-CM | POA: Insufficient documentation

## 2021-02-19 DIAGNOSIS — Z4889 Encounter for other specified surgical aftercare: Secondary | ICD-10-CM | POA: Diagnosis not present

## 2021-02-19 DIAGNOSIS — Z419 Encounter for procedure for purposes other than remedying health state, unspecified: Secondary | ICD-10-CM

## 2021-02-19 DIAGNOSIS — M48061 Spinal stenosis, lumbar region without neurogenic claudication: Secondary | ICD-10-CM | POA: Diagnosis not present

## 2021-02-19 DIAGNOSIS — E785 Hyperlipidemia, unspecified: Secondary | ICD-10-CM | POA: Diagnosis not present

## 2021-02-19 DIAGNOSIS — I2510 Atherosclerotic heart disease of native coronary artery without angina pectoris: Secondary | ICD-10-CM | POA: Insufficient documentation

## 2021-02-19 HISTORY — PX: LUMBAR LAMINECTOMY/DECOMPRESSION MICRODISCECTOMY: SHX5026

## 2021-02-19 LAB — CBC
HCT: 39.2 % (ref 39.0–52.0)
Hemoglobin: 12.9 g/dL — ABNORMAL LOW (ref 13.0–17.0)
MCH: 32.8 pg (ref 26.0–34.0)
MCHC: 32.9 g/dL (ref 30.0–36.0)
MCV: 99.7 fL (ref 80.0–100.0)
Platelets: 135 10*3/uL — ABNORMAL LOW (ref 150–400)
RBC: 3.93 MIL/uL — ABNORMAL LOW (ref 4.22–5.81)
RDW: 12.7 % (ref 11.5–15.5)
WBC: 5.8 10*3/uL (ref 4.0–10.5)
nRBC: 0 % (ref 0.0–0.2)

## 2021-02-19 LAB — SURGICAL PCR SCREEN
MRSA, PCR: NEGATIVE
Staphylococcus aureus: NEGATIVE

## 2021-02-19 LAB — BASIC METABOLIC PANEL
Anion gap: 7 (ref 5–15)
BUN: 15 mg/dL (ref 8–23)
CO2: 24 mmol/L (ref 22–32)
Calcium: 8.7 mg/dL — ABNORMAL LOW (ref 8.9–10.3)
Chloride: 106 mmol/L (ref 98–111)
Creatinine, Ser: 1.19 mg/dL (ref 0.61–1.24)
GFR, Estimated: >60 mL/min (ref >60)
Glucose, Bld: 123 mg/dL — ABNORMAL HIGH (ref 70–99)
Potassium: 4.1 mmol/L (ref 3.5–5.1)
Sodium: 137 mmol/L (ref 135–145)

## 2021-02-19 LAB — SARS CORONAVIRUS 2 BY RT PCR (HOSPITAL ORDER, PERFORMED IN ~~LOC~~ HOSPITAL LAB): SARS Coronavirus 2: NEGATIVE

## 2021-02-19 SURGERY — LUMBAR LAMINECTOMY/DECOMPRESSION MICRODISCECTOMY 1 LEVEL
Anesthesia: General | Site: Spine Lumbar | Laterality: Right

## 2021-02-19 MED ORDER — DEXAMETHASONE SODIUM PHOSPHATE 10 MG/ML IJ SOLN
INTRAMUSCULAR | Status: AC
  Filled 2021-02-19: qty 1

## 2021-02-19 MED ORDER — MELOXICAM 15 MG PO TABS: 15.0000 mg | ORAL_TABLET | Freq: Every day | ORAL | 3 refills | Status: DC

## 2021-02-19 MED ORDER — ONDANSETRON HCL 4 MG/2ML IJ SOLN
INTRAMUSCULAR | Status: AC
  Filled 2021-02-19: qty 2

## 2021-02-19 MED ORDER — SODIUM CHLORIDE 0.9% FLUSH
3.0000 mL | Freq: Two times a day (BID) | INTRAVENOUS | Status: DC
  Administered 2021-02-19: 3 mL via INTRAVENOUS

## 2021-02-19 MED ORDER — BUPIVACAINE-EPINEPHRINE (PF) 0.5% -1:200000 IJ SOLN
INTRAMUSCULAR | Status: DC | PRN
  Administered 2021-02-19: 20 mL

## 2021-02-19 MED ORDER — STERILE WATER FOR IRRIGATION IR SOLN
Status: DC | PRN
  Administered 2021-02-19: 1000 mL

## 2021-02-19 MED ORDER — PHENOL 1.4 % MT LIQD: 1.0000 | OROMUCOSAL | Status: DC | PRN

## 2021-02-19 MED ORDER — BISACODYL 10 MG RE SUPP: 10.0000 mg | Freq: Every day | RECTAL | Status: DC | PRN

## 2021-02-19 MED ORDER — ROCURONIUM BROMIDE 10 MG/ML (PF) SYRINGE
PREFILLED_SYRINGE | INTRAVENOUS | Status: AC
  Filled 2021-02-19: qty 10

## 2021-02-19 MED ORDER — ONDANSETRON HCL 4 MG/2ML IJ SOLN: 4.0000 mg | Freq: Four times a day (QID) | INTRAMUSCULAR | Status: DC | PRN

## 2021-02-19 MED ORDER — OMEPRAZOLE 40 MG PO CPDR: 40.0000 mg | DELAYED_RELEASE_CAPSULE | Freq: Every day | ORAL | Status: DC

## 2021-02-19 MED ORDER — CYCLOBENZAPRINE HCL 10 MG PO TABS: 10.0000 mg | ORAL_TABLET | Freq: Three times a day (TID) | ORAL | Status: DC | PRN

## 2021-02-19 MED ORDER — SODIUM CHLORIDE 0.9% FLUSH: 3.0000 mL | INTRAVENOUS | Status: DC | PRN

## 2021-02-19 MED ORDER — SODIUM CHLORIDE 0.9 % IV SOLN: 250.0000 mL | INTRAVENOUS | Status: DC

## 2021-02-19 MED ORDER — BACITRACIN ZINC 500 UNIT/GM EX OINT
TOPICAL_OINTMENT | CUTANEOUS | Status: DC | PRN
  Administered 2021-02-19: 1 via TOPICAL

## 2021-02-19 MED ORDER — THROMBIN 5000 UNITS EX SOLR
CUTANEOUS | Status: DC | PRN
  Administered 2021-02-19: 5000 [IU] via TOPICAL

## 2021-02-19 MED ORDER — CEFAZOLIN SODIUM-DEXTROSE 2-4 GM/100ML-% IV SOLN
2.0000 g | Freq: Three times a day (TID) | INTRAVENOUS | Status: DC
Start: 2021-02-19 — End: 2021-02-20
  Administered 2021-02-19: 2 g via INTRAVENOUS
  Filled 2021-02-19: qty 100

## 2021-02-19 MED ORDER — ROCURONIUM BROMIDE 10 MG/ML (PF) SYRINGE
PREFILLED_SYRINGE | INTRAVENOUS | Status: DC | PRN
  Administered 2021-02-19: 60 mg via INTRAVENOUS

## 2021-02-19 MED ORDER — FENTANYL CITRATE (PF) 100 MCG/2ML IJ SOLN
INTRAMUSCULAR | Status: DC | PRN
  Administered 2021-02-19: 100 ug via INTRAVENOUS
  Administered 2021-02-19: 25 ug via INTRAVENOUS
  Administered 2021-02-19: 50 ug via INTRAVENOUS
  Administered 2021-02-19: 25 ug via INTRAVENOUS
  Administered 2021-02-19: 50 ug via INTRAVENOUS

## 2021-02-19 MED ORDER — CEFAZOLIN SODIUM-DEXTROSE 2-4 GM/100ML-% IV SOLN
2.0000 g | INTRAVENOUS | Status: AC
  Administered 2021-02-19: 2 g via INTRAVENOUS
  Filled 2021-02-19: qty 100

## 2021-02-19 MED ORDER — LIDOCAINE 2% (20 MG/ML) 5 ML SYRINGE
INTRAMUSCULAR | Status: DC | PRN
  Administered 2021-02-19: 100 mg via INTRAVENOUS
  Administered 2021-02-19: 500 mg via INTRAVENOUS

## 2021-02-19 MED ORDER — PROPOFOL 10 MG/ML IV BOLUS
INTRAVENOUS | Status: AC
  Filled 2021-02-19: qty 20

## 2021-02-19 MED ORDER — MORPHINE SULFATE (PF) 4 MG/ML IV SOLN: 4.0000 mg | INTRAVENOUS | Status: DC | PRN

## 2021-02-19 MED ORDER — MENTHOL 3 MG MT LOZG: 1.0000 | LOZENGE | OROMUCOSAL | Status: DC | PRN

## 2021-02-19 MED ORDER — OXYCODONE HCL 5 MG PO TABS: 10.0000 mg | ORAL_TABLET | ORAL | Status: DC | PRN

## 2021-02-19 MED ORDER — OXYCODONE HCL 5 MG PO TABS: 5.0000 mg | ORAL_TABLET | ORAL | Status: DC | PRN

## 2021-02-19 MED ORDER — ATORVASTATIN CALCIUM 40 MG PO TABS
40.0000 mg | ORAL_TABLET | Freq: Every day | ORAL | Status: DC
  Administered 2021-02-19: 40 mg via ORAL
  Filled 2021-02-19: qty 1

## 2021-02-19 MED ORDER — SIMVASTATIN 80 MG PO TABS: 40.0000 mg | ORAL_TABLET | Freq: Every day | ORAL | Status: DC

## 2021-02-19 MED ORDER — LACTATED RINGERS IV SOLN: INTRAVENOUS | Status: DC

## 2021-02-19 MED ORDER — DOCUSATE SODIUM 100 MG PO CAPS: 100.0000 mg | ORAL_CAPSULE | Freq: Two times a day (BID) | ORAL | 0 refills | Status: DC

## 2021-02-19 MED ORDER — THROMBIN 5000 UNITS EX SOLR
CUTANEOUS | Status: AC
  Filled 2021-02-19: qty 5000

## 2021-02-19 MED ORDER — ORAL CARE MOUTH RINSE: 15.0000 mL | Freq: Once | OROMUCOSAL | Status: AC

## 2021-02-19 MED ORDER — CHLORHEXIDINE GLUCONATE 0.12 % MT SOLN
15.0000 mL | Freq: Once | OROMUCOSAL | Status: AC
  Administered 2021-02-19: 15 mL via OROMUCOSAL
  Filled 2021-02-19: qty 15

## 2021-02-19 MED ORDER — BUPIVACAINE-EPINEPHRINE 0.5% -1:200000 IJ SOLN
INTRAMUSCULAR | Status: AC
  Filled 2021-02-19: qty 1

## 2021-02-19 MED ORDER — BUSPIRONE HCL 5 MG PO TABS: 5.0000 mg | ORAL_TABLET | Freq: Three times a day (TID) | ORAL | Status: DC | PRN

## 2021-02-19 MED ORDER — ALBUMIN HUMAN 5 % IV SOLN: INTRAVENOUS | Status: DC | PRN

## 2021-02-19 MED ORDER — ONDANSETRON HCL 4 MG PO TABS: 4.0000 mg | ORAL_TABLET | Freq: Four times a day (QID) | ORAL | Status: DC | PRN

## 2021-02-19 MED ORDER — MELOXICAM 7.5 MG PO TABS: 15.0000 mg | ORAL_TABLET | Freq: Every day | ORAL | Status: DC

## 2021-02-19 MED ORDER — CYCLOBENZAPRINE HCL 10 MG PO TABS: 10.0000 mg | ORAL_TABLET | Freq: Three times a day (TID) | ORAL | 0 refills | Status: DC | PRN

## 2021-02-19 MED ORDER — PHENYLEPHRINE 40 MCG/ML (10ML) SYRINGE FOR IV PUSH (FOR BLOOD PRESSURE SUPPORT)
PREFILLED_SYRINGE | INTRAVENOUS | Status: AC
  Filled 2021-02-19: qty 10

## 2021-02-19 MED ORDER — ACETAMINOPHEN 325 MG PO TABS: 650.0000 mg | ORAL_TABLET | ORAL | Status: DC | PRN

## 2021-02-19 MED ORDER — PANTOPRAZOLE SODIUM 40 MG PO TBEC: 80.0000 mg | DELAYED_RELEASE_TABLET | Freq: Every day | ORAL | Status: DC

## 2021-02-19 MED ORDER — FENTANYL CITRATE (PF) 250 MCG/5ML IJ SOLN
INTRAMUSCULAR | Status: AC
  Filled 2021-02-19: qty 5

## 2021-02-19 MED ORDER — FLUTICASONE PROPIONATE 50 MCG/ACT NA SUSP: 2.0000 | Freq: Every day | NASAL | Status: DC | PRN

## 2021-02-19 MED ORDER — GABAPENTIN 300 MG PO CAPS: 300.0000 mg | ORAL_CAPSULE | Freq: Three times a day (TID) | ORAL | Status: DC | PRN

## 2021-02-19 MED ORDER — LOSARTAN POTASSIUM 50 MG PO TABS
50.0000 mg | ORAL_TABLET | Freq: Every day | ORAL | Status: DC
  Administered 2021-02-19: 50 mg via ORAL
  Filled 2021-02-19: qty 1

## 2021-02-19 MED ORDER — FENTANYL CITRATE (PF) 100 MCG/2ML IJ SOLN: 25.0000 ug | INTRAMUSCULAR | Status: DC | PRN

## 2021-02-19 MED ORDER — LIDOCAINE 2% (20 MG/ML) 5 ML SYRINGE
INTRAMUSCULAR | Status: AC
  Filled 2021-02-19: qty 5

## 2021-02-19 MED ORDER — CHLORHEXIDINE GLUCONATE CLOTH 2 % EX PADS: 6.0000 | MEDICATED_PAD | Freq: Once | CUTANEOUS | Status: DC

## 2021-02-19 MED ORDER — DEXAMETHASONE SODIUM PHOSPHATE 10 MG/ML IJ SOLN
INTRAMUSCULAR | Status: DC | PRN
  Administered 2021-02-19: 4 mg via INTRAVENOUS

## 2021-02-19 MED ORDER — ACETAMINOPHEN 500 MG PO TABS
1000.0000 mg | ORAL_TABLET | Freq: Four times a day (QID) | ORAL | Status: DC
Start: 2021-02-19 — End: 2021-02-20
  Administered 2021-02-19: 1000 mg via ORAL
  Filled 2021-02-19: qty 2

## 2021-02-19 MED ORDER — DOCUSATE SODIUM 100 MG PO CAPS: 100.0000 mg | ORAL_CAPSULE | Freq: Two times a day (BID) | ORAL | Status: DC

## 2021-02-19 MED ORDER — BACITRACIN ZINC 500 UNIT/GM EX OINT
TOPICAL_OINTMENT | CUTANEOUS | Status: AC
  Filled 2021-02-19: qty 28.35

## 2021-02-19 MED ORDER — 0.9 % SODIUM CHLORIDE (POUR BTL) OPTIME
TOPICAL | Status: DC | PRN
  Administered 2021-02-19: 1000 mL

## 2021-02-19 MED ORDER — BUSPIRONE HCL 5 MG PO TABS
5.0000 mg | ORAL_TABLET | Freq: Three times a day (TID) | ORAL | Status: DC | PRN
  Filled 2021-02-19: qty 1

## 2021-02-19 MED ORDER — SODIUM CHLORIDE 0.9 % IV SOLN
INTRAVENOUS | Status: DC | PRN
  Administered 2021-02-19 (×5): 80 ug via INTRAVENOUS

## 2021-02-19 MED ORDER — OXYCODONE HCL 5 MG PO TABS: 5.0000 mg | ORAL_TABLET | ORAL | 0 refills | Status: DC | PRN

## 2021-02-19 MED ORDER — ACETAMINOPHEN 650 MG RE SUPP: 650.0000 mg | RECTAL | Status: DC | PRN

## 2021-02-19 MED ORDER — PROPOFOL 10 MG/ML IV BOLUS
INTRAVENOUS | Status: DC | PRN
  Administered 2021-02-19: 50 mg via INTRAVENOUS
  Administered 2021-02-19: 150 mg via INTRAVENOUS

## 2021-02-19 MED ORDER — ONDANSETRON HCL 4 MG/2ML IJ SOLN
INTRAMUSCULAR | Status: DC | PRN
  Administered 2021-02-19: 4 mg via INTRAVENOUS

## 2021-02-19 MED ORDER — PHENYLEPHRINE HCL-NACL 10-0.9 MG/250ML-% IV SOLN
INTRAVENOUS | Status: DC | PRN
  Administered 2021-02-19: 50 ug/min via INTRAVENOUS

## 2021-02-19 MED ORDER — SUGAMMADEX SODIUM 200 MG/2ML IV SOLN
INTRAVENOUS | Status: DC | PRN
  Administered 2021-02-19: 400 mg via INTRAVENOUS

## 2021-02-19 SURGICAL SUPPLY — 47 items
BAG COUNTER SPONGE SURGICOUNT (BAG) ×2 IMPLANT
BAND RUBBER #18 3X1/16 STRL (MISCELLANEOUS) ×4 IMPLANT
BENZOIN TINCTURE PRP APPL 2/3 (GAUZE/BANDAGES/DRESSINGS) ×2 IMPLANT
BLADE CLIPPER SURG (BLADE) ×2 IMPLANT
BUR MATCHSTICK NEURO 3.0 LAGG (BURR) ×2 IMPLANT
BUR PRECISION FLUTE 6.0 (BURR) ×2 IMPLANT
CANISTER SUCT 3000ML PPV (MISCELLANEOUS) ×2 IMPLANT
CARTRIDGE OIL MAESTRO DRILL (MISCELLANEOUS) ×1 IMPLANT
CLOSURE STERI-STRIP 1/4X4 (GAUZE/BANDAGES/DRESSINGS) ×2 IMPLANT
DERMABOND ADVANCED (GAUZE/BANDAGES/DRESSINGS) ×1
DERMABOND ADVANCED .7 DNX12 (GAUZE/BANDAGES/DRESSINGS) ×1 IMPLANT
DIFFUSER DRILL AIR PNEUMATIC (MISCELLANEOUS) ×2 IMPLANT
DRAPE LAPAROTOMY 100X72X124 (DRAPES) ×2 IMPLANT
DRAPE MICROSCOPE LEICA (MISCELLANEOUS) ×2 IMPLANT
DRAPE SURG 17X23 STRL (DRAPES) ×8 IMPLANT
DRSG OPSITE POSTOP 4X6 (GAUZE/BANDAGES/DRESSINGS) ×2 IMPLANT
ELECT BLADE 4.0 EZ CLEAN MEGAD (MISCELLANEOUS) ×2
ELECT REM PT RETURN 9FT ADLT (ELECTROSURGICAL) ×2
ELECTRODE BLDE 4.0 EZ CLN MEGD (MISCELLANEOUS) ×1 IMPLANT
ELECTRODE REM PT RTRN 9FT ADLT (ELECTROSURGICAL) ×1 IMPLANT
GAUZE 4X4 16PLY ~~LOC~~+RFID DBL (SPONGE) IMPLANT
GAUZE SPONGE 4X4 12PLY STRL (GAUZE/BANDAGES/DRESSINGS) ×2 IMPLANT
GLOVE EXAM NITRILE XL STR (GLOVE) ×2 IMPLANT
GLOVE SURG ENC MOIS LTX SZ8 (GLOVE) ×2 IMPLANT
GLOVE SURG ENC MOIS LTX SZ8.5 (GLOVE) ×2 IMPLANT
GOWN STRL REUS W/ TWL LRG LVL3 (GOWN DISPOSABLE) IMPLANT
GOWN STRL REUS W/ TWL XL LVL3 (GOWN DISPOSABLE) ×1 IMPLANT
GOWN STRL REUS W/TWL 2XL LVL3 (GOWN DISPOSABLE) IMPLANT
GOWN STRL REUS W/TWL LRG LVL3 (GOWN DISPOSABLE)
GOWN STRL REUS W/TWL XL LVL3 (GOWN DISPOSABLE) ×1
KIT BASIN OR (CUSTOM PROCEDURE TRAY) ×2 IMPLANT
KIT TURNOVER KIT B (KITS) ×2 IMPLANT
NEEDLE HYPO 21X1.5 SAFETY (NEEDLE) IMPLANT
NEEDLE HYPO 22GX1.5 SAFETY (NEEDLE) ×2 IMPLANT
NS IRRIG 1000ML POUR BTL (IV SOLUTION) ×2 IMPLANT
OIL CARTRIDGE MAESTRO DRILL (MISCELLANEOUS) ×2
PACK LAMINECTOMY NEURO (CUSTOM PROCEDURE TRAY) ×2 IMPLANT
PAD ARMBOARD 7.5X6 YLW CONV (MISCELLANEOUS) ×6 IMPLANT
PATTIES SURGICAL .5 X1 (DISPOSABLE) IMPLANT
SPONGE SURGIFOAM ABS GEL SZ50 (HEMOSTASIS) ×2 IMPLANT
STRIP CLOSURE SKIN 1/2X4 (GAUZE/BANDAGES/DRESSINGS) ×2 IMPLANT
SUT VIC AB 1 CT1 18XBRD ANBCTR (SUTURE) ×1 IMPLANT
SUT VIC AB 1 CT1 8-18 (SUTURE) ×1
SUT VIC AB 2-0 CP2 18 (SUTURE) ×2 IMPLANT
TOWEL GREEN STERILE (TOWEL DISPOSABLE) ×2 IMPLANT
TOWEL GREEN STERILE FF (TOWEL DISPOSABLE) ×2 IMPLANT
WATER STERILE IRR 1000ML POUR (IV SOLUTION) ×2 IMPLANT

## 2021-02-19 NOTE — Evaluation (Signed)
Physical Therapy Evaluation Patient Details Name: William Park MRN: [id number] DOB: 08/08/1950 Today's Date: 02/19/2021   History of Present Illness  Pt adm 7/25 s/p Rt L4-5 laminotomy/foraminotomies. PMH - CAD, HTN, OA  Clinical Impression  Pt doing well with mobility and no further PT needed.  Ready for dc from PT standpoint. Reviewed back hand out and reviewed ADL portions. Pt/spouse verbalized understanding.      Follow Up Recommendations No PT follow up    Equipment Recommendations  None recommended by PT    Recommendations for Other Services       Precautions / Restrictions Precautions Precautions: Back      Mobility  Bed Mobility Overal bed mobility: Modified Independent                  Transfers Overall transfer level: Modified independent Equipment used: None                Ambulation/Gait Ambulation/Gait assistance: Supervision Gait Distance (Feet): 300 Feet Assistive device: None Gait Pattern/deviations: Step-through pattern Gait velocity: decr Gait velocity interpretation: 1.31 - 2.62 ft/sec, indicative of limited community ambulator General Gait Details: Steady gait  Stairs Stairs: Yes Stairs assistance: Min guard Stair Management: Step to pattern;Forwards;Backwards (hand held) Number of Stairs: 3 General stair comments: Hand held. Forward step up and backward step down  Wheelchair Mobility    Modified Rankin (Stroke Patients Only)       Balance Overall balance assessment: No apparent balance deficits (not formally assessed)                                           Pertinent Vitals/Pain Pain Assessment: Faces Faces Pain Scale: Hurts little more Pain Location: back Pain Descriptors / Indicators: Operative site guarding;Sore Pain Intervention(s): Limited activity within patient's tolerance;Repositioned    Home Living Family/patient expects to be discharged to:: Private residence Living  Arrangements: Spouse/significant other Available Help at Discharge: Family;Available 24 hours/day Type of Home: House Home Access: Stairs to enter Entrance Stairs-Rails: None Entrance Stairs-Number of Steps: 3 Home Layout: Two level;Able to live on main level with bedroom/bathroom Home Equipment: None      Prior Function Level of Independence: Independent         Comments: works as Management consultant Extremity Assessment Upper Extremity Assessment: Overall WFL for tasks assessed    Lower Extremity Assessment Lower Extremity Assessment: Overall WFL for tasks assessed       Communication   Communication: No difficulties  Cognition Arousal/Alertness: Awake/alert Behavior During Therapy: WFL for tasks assessed/performed Overall Cognitive Status: Within Functional Limits for tasks assessed                                        General Comments      Exercises     Assessment/Plan    PT Assessment Patent does not need any further PT services  PT Problem List         PT Treatment Interventions      PT Goals (Current goals can be found in the Care Plan section)  Acute Rehab PT Goals PT Goal Formulation: All assessment and education complete, DC therapy    Frequency  Barriers to discharge        Co-evaluation               AM-PAC PT "6 Clicks" Mobility  Outcome Measure Help needed turning from your back to your side while in a flat bed without using bedrails?: None Help needed moving from lying on your back to sitting on the side of a flat bed without using bedrails?: None Help needed moving to and from a bed to a chair (including a wheelchair)?: None Help needed standing up from a chair using your arms (e.g., wheelchair or bedside chair)?: None Help needed to walk in hospital room?: None Help needed climbing 3-5 steps with a railing? : A Little 6 Click Score: 23     End of Session   Activity Tolerance: Patient tolerated treatment well Patient left: in bed;with call bell/phone within reach;with family/visitor present Nurse Communication: Mobility status PT Visit Diagnosis: Pain Pain - part of body:  (back)    Time: [id number] PT Time Calculation (min) (ACUTE ONLY): 18 min   Charges:   PT Evaluation $PT Eval Low Complexity: 1 Low          [place] Pager [phone] Office [place] 02/19/2021, 5:30 PM

## 2021-02-19 NOTE — Op Note (Signed)
Brief history: The patient is a 70 year old white male who has complained of back and right leg pain consistent with a lumbar radiculopathy.  He has failed medical management and was worked up with a lumbar MRI which demonstrated spinal stenosis at L4-5.  I discussed the various treatment options.  He has decided proceed with surgery.  Preoperative diagnosis: Right L4-5 stenosis, lumbar radiculopathy, lumbago  Postoperative diagnosis: The same  Procedure: Right L4-5 laminotomy/foraminotomies  using micro-dissection  Surgeon: Dr. Earle Gell  Asst.: Arnetha Massy, NP  Anesthesia: Gen. endotracheal  Estimated blood loss: 75 cc  Drains: None  Complications: None  Description of procedure: The patient was brought to the operating room by the anesthesia team. General endotracheal anesthesia was induced. The patient was turned to the prone position on the Wilson frame. The patient's lumbosacral region was then prepared with Betadine scrub and Betadine solution. Sterile drapes were applied.  I then injected the area to be incised with Marcaine with epinephrine solution. I then used a scalpel to make a linear midline incision over the L4-5 intervertebral disc space. I then used electrocautery to perform a right sided subperiosteal dissection exposing the spinous process and lamina of L4 and L5. We obtained intraoperative radiograph to confirm our location. I then inserted the [facility] retractor for exposure.  We then brought the operative microscope into the field. Under its magnification and illumination we completed the microdissection. I used a high-speed drill to perform a laminotomy at L4-5 on the right. I then used a Kerrison punches to widen the laminotomy and removed the ligamentum flavum at L4-5 on the right. We then used microdissection to free up the thecal sac and the L5 nerve root from the epidural tissue. I then used a Kerrison punch to perform a foraminotomy at about the right L4  and L5 nerve root.  We inspected the vertebral disc at L4-5 on the right.  There were no herniations.  I then palpated along the ventral surface of the thecal sac and along exit route of the right L4 and L5 nerve root and noted that the neural structures were well decompressed. This completed the decompression.  We then obtained hemostasis using bipolar electrocautery. We irrigated the wound out with bacitracin solution. We then removed the retractor. We then reapproximated the patient's thoracolumbar fascia with interrupted #1 Vicryl suture. We then reapproximated the patient's subcutaneous tissue with interrupted 2-0 Vicryl suture. We then reapproximated patient's skin with Steri-Strips and benzoin. The was then coated with bacitracin ointment. The drapes were removed. The patient was subsequently returned to the supine position where they were extubated by the anesthesia team. The patient was then transported to the postanesthesia care unit in stable condition. All sponge instrument and needle counts were reportedly correct at the end of this case.

## 2021-02-19 NOTE — Discharge Instructions (Signed)
Wait to restart NSAIDs (i.e., Meloxicam, ibuprofen, Aleve, etc.) until 5 days post op, 02/24/2021.  Wound Care Keep incision covered and dry for two days.    Do not put any creams, lotions, or ointments on incision. Leave steri-strips on back.  They will fall off by themselves.  Activity Walk each and every day, increasing distance each day. No lifting greater than 5 lbs. No driving for 2 weeks; may ride as a passenger locally.  Diet Resume your normal diet.   Return to Work Will be discussed at your follow up appointment.  Call Your Doctor If Any of These Occur Redness, drainage, or swelling at the wound.  Temperature greater than 101 degrees. Severe pain not relieved by pain medication. Incision starts to come apart.  Follow Up Appt Call today for appointment in 1-2 weeks [phone]) or for problems.

## 2021-02-19 NOTE — Addendum Note (Signed)
Addendum  created 02/19/21 1721 by Josephine Igo, CRNA   Intraprocedure Meds edited

## 2021-02-19 NOTE — Discharge Summary (Signed)
Physician Discharge Summary     Providing Compassionate, Quality Care - Together   Patient ID: William Park MRN: [id number] DOB/AGE: 02-23-1951 70 y.o.  Admit date: 02/19/2021 Discharge date: 02/19/2021  Admission Diagnoses: Spinal stenosis of lumbar region with neurogenic claudication  Discharge Diagnoses:  Active Problems:   Spinal stenosis of lumbar region with neurogenic claudication   Discharged Condition: good  Hospital Course: Patient underwent a right L4-5 laminotomy/foraminotomies by Dr. Arnoldo Morale on 02/19/2021. He was admitted to 3C08 following recovery from anesthesia in the PACU. His postoperative course has been uncomplicated. He has worked with physical therapy, who feel the patient is ready for discharge home. He is ambulating independently and without difficulty. He is tolerating a normal diet. He is not having any bowel or bladder dysfunction. His pain is well-controlled with oral pain medication. He is ready for discharge to home.   Consults: None  Significant Diagnostic Studies: radiology: DG Lumbar Spine 1 View  Result Date: 02/19/2021 CLINICAL DATA:  Intraoperative localization for L4-L5 laminectomy. EXAM: LUMBAR SPINE - 1 VIEW COMPARISON:  Lumbar spine MRI-10/15/2020 FINDINGS: Single spot intraoperative radiographic image of the lower lumbar spine is provided for review. Spinal labeling is in keeping with preprocedural lumbar spine MRI. Radiopaque marking instrument overlies the soft tissues posterior to the L3-L4 intervertebral disc space. IMPRESSION: Intraoperative localization of L3-L4 as above. Electronically Signed   By: Sandi Mariscal M.D.   On: 02/19/2021 12:51     Treatments: surgery: Right L4-5 laminotomy/foraminotomies  using micro-dissection  Discharge Exam: Blood pressure 140/78, pulse 82, temperature 97.7 F (36.5 C), temperature source Oral, resp. rate 18, height '5\' 11"'$  (1.803 m), weight 99.8 kg, SpO2 96 %.  Per report: Alert and oriented  x 4 PERRLA CN II-XII grossly intact MAE, Strength and sensation intact Incision is covered with Honeycomb dressing and Steri Strips; Dressing is clean, dry, and intact   Disposition: Discharge disposition: 01-Home or Self Care        Allergies as of 02/19/2021   No Known Allergies      Medication List     TAKE these medications    aspirin 81 MG tablet Take 81 mg by mouth daily.   busPIRone 5 MG tablet Commonly known as: BUSPAR Take 1 tablet (5 mg total) by mouth 3 (three) times daily as needed (unknown).   cyclobenzaprine 10 MG tablet Commonly known as: FLEXERIL Take 1 tablet (10 mg total) by mouth 3 (three) times daily as needed for muscle spasms.   docusate sodium 100 MG capsule Commonly known as: COLACE Take 1 capsule (100 mg total) by mouth 2 (two) times daily.   famotidine 20 MG tablet Commonly known as: PEPCID Take 1 tablet (20 mg total) by mouth 2 (two) times daily.   fluticasone 50 MCG/ACT nasal spray Commonly known as: FLONASE Place 2 sprays into both nostrils daily as needed for allergies or rhinitis.   gabapentin 300 MG capsule Commonly known as: NEURONTIN Take 1 capsule (300 mg total) by mouth 3 (three) times daily as needed (Nerve pain).   losartan 50 MG tablet Commonly known as: COZAAR Take 1 tablet (50 mg total) by mouth daily.   meloxicam 15 MG tablet Commonly known as: MOBIC Take 1 tablet (15 mg total) by mouth daily. Restart 02/24/2021 What changed: additional instructions   omeprazole 40 MG capsule Commonly known as: PRILOSEC Take 1 capsule (40 mg total) by mouth daily. [place].   oxyCODONE 5 MG immediate release tablet Commonly known as:  Oxy IR/ROXICODONE Take 1 tablet (5 mg total) by mouth every 4 (four) hours as needed for moderate pain ((score 4 to 6)).   sildenafil 100 MG tablet Commonly known as: Viagra Take 1 tablet (100 mg total) by mouth daily as needed for erectile dysfunction.   simvastatin 80 MG  tablet Commonly known as: ZOCOR Take 0.5 tablets (40 mg total) by mouth at bedtime.        Follow-up Information     Newman Pies, MD. Schedule an appointment as soon as possible for a visit in 3 week(s).   Specialty: Neurosurgery Contact information: [address] [place] [address] [phone]                 Signed: Viona Gilmore, DNP, AGNP-C Nurse Practitioner  [facility] [place] [address], [address], [place], [place] [address] P: [phone]    F: [phone]  02/19/2021, 6:17 PM

## 2021-02-19 NOTE — Progress Notes (Signed)
Patient was transported via wheelchair by NT for discharge home; in no acute distress nor complaints of pain nor discomfort; room was checked for his belongings and everything accounted for; discharge instructions given to patient by RN and he verbalized understanding on the instructions given.

## 2021-02-19 NOTE — Anesthesia Postprocedure Evaluation (Signed)
Anesthesia Post Note  Patient: William Park  Procedure(s) Performed: L4-L5 LAMINOTOMY/FORAMINOTOMY (Right: Spine Lumbar)     Patient location during evaluation: PACU Anesthesia Type: General Level of consciousness: awake Pain management: pain level controlled Vital Signs Assessment: post-procedure vital signs reviewed and stable Respiratory status: spontaneous breathing Cardiovascular status: stable Postop Assessment: no apparent nausea or vomiting Anesthetic complications: no   No notable events documented.  Last Vitals:  Vitals:   02/19/21 0715 02/19/21 1245  BP: 129/72 125/67  Pulse:  92  Resp: 18 17  Temp: 36.8 C 36.5 C  SpO2: 98% 97%    Last Pain:  Vitals:   02/19/21 0835  TempSrc:   PainSc: 3                  Leonte Horrigan

## 2021-02-19 NOTE — Transfer of Care (Signed)
Immediate Anesthesia Transfer of Care Note  Patient: William Park  Procedure(s) Performed: L4-L5 LAMINOTOMY/FORAMINOTOMY (Right: Spine Lumbar)  Patient Location: PACU  Anesthesia Type:General  Level of Consciousness: awake, alert , oriented, drowsy and patient cooperative  Airway & Oxygen Therapy: Patient Spontanous Breathing and Patient connected to face mask oxygen  Post-op Assessment: Report given to RN, Post -op Vital signs reviewed and stable and Patient moving all extremities X 4  Post vital signs: Reviewed and stable  Last Vitals:  Vitals Value Taken Time  BP 125/67 02/19/21 1243  Temp    Pulse 99 02/19/21 1246  Resp 14 02/19/21 1246  SpO2 97 % 02/19/21 1246  Vitals shown include unvalidated device data.  Last Pain:  Vitals:   02/19/21 0835  TempSrc:   PainSc: 3       Patients Stated Pain Goal: 2 (0000000 [id number])  Complications: No notable events documented.

## 2021-02-19 NOTE — Anesthesia Procedure Notes (Signed)
Procedure Name: Intubation Date/Time: 02/19/2021 10:55 AM Performed by: Annamary Carolin, CRNA Pre-anesthesia Checklist: Patient identified, Emergency Drugs available, Suction available and Patient being monitored Patient Re-evaluated:Patient Re-evaluated prior to induction Oxygen Delivery Method: Circle System Utilized Preoxygenation: Pre-oxygenation with 100% oxygen Induction Type: IV induction Ventilation: Mask ventilation without difficulty Laryngoscope Size: Mac and 3 Grade View: Grade II Tube type: Oral Tube size: 8.0 mm Number of attempts: 1 Airway Equipment and Method: Stylet and Oral airway Placement Confirmation: ETT inserted through vocal cords under direct vision, positive ETCO2 and breath sounds checked- equal and bilateral Tube secured with: Tape Dental Injury: Teeth and Oropharynx as per pre-operative assessment  Comments: With ease

## 2021-02-19 NOTE — H&P (Signed)
Subjective: The patient is a 70 year old white male who is complained of back, right buttock and leg pain consistent with a lumbar radiculopathy.  He has failed medical management and was worked up with a lumbar MRI which demonstrated spinal stenosis at L4-5 on the right.  I discussed the various treatment options with him.  He has decided proceed with surgery.  Past Medical History:  Diagnosis Date   Allergic rhinitis    allegra and flonase   CAD (coronary artery disease)    Dr. [name] s/p 4 angioplasty's with last in 42. ASA. simvastatin '80mg'$    GERD (gastroesophageal reflux disease)    nexium '40mg'$  and zantac BID-mout fills with saliva and coughing. Seeing GI in [facility] 12/2014   Hyperlipidemia    simvastatin '80mg'$ , niaspan '750mg'$    Hypertension    losartan '50mg'$    Myocardial infarction ([place])    "small MI, mild"- 1994   Osteoarthritis    mainly hands. Mobic '15mg'$     Past Surgical History:  Procedure Laterality Date   ANGIOPLASTY     x4   COLONOSCOPY     EYE SURGERY     pyloric stenosis     as a child   TONSILLECTOMY     UPPER GASTROINTESTINAL ENDOSCOPY      No Known Allergies  Social History   Tobacco Use   Smoking status: Former    Packs/day: 2.00    Years: 25.00    Pack years: 50.00    Types: Cigarettes    Quit date: 07/30/1987    Years since quitting: 33.5   Smokeless tobacco: Never  Substance Use Topics   Alcohol use: Not Currently    Family History  Problem Relation Age of Onset   Dementia Father        passed age 67   Kidney disease Father        CKD   CAD Father    AAA (abdominal aortic aneurysm) Father    Breast cancer Mother    Congestive Heart Failure Mother        59 time of death   CAD Brother    Colon cancer Neg Hx    Esophageal cancer Neg Hx    Stomach cancer Neg Hx    Rectal cancer Neg Hx    Prior to Admission medications   Medication Sig Start Date End Date Taking? Authorizing Provider  aspirin 81 MG tablet Take 81 mg by mouth daily.   Yes  [provider]  busPIRone (BUSPAR) 5 MG tablet Take 1 tablet (5 mg total) by mouth 3 (three) times daily as needed. Patient taking differently: Take 5 mg by mouth 3 (three) times daily as needed (unknown). 02/28/20  Yes Marin Olp, MD  famotidine (PEPCID) 20 MG tablet Take 1 tablet (20 mg total) by mouth 2 (two) times daily. 08/28/20  Yes Marin Olp, MD  fluticasone (FLONASE) 50 MCG/ACT nasal spray SPRAY 2 SPRAYS INTO EACH NOSTRIL ONCE DAILY Patient taking differently: Place 2 sprays into both nostrils daily as needed for allergies or rhinitis. 01/16/21  Yes Marin Olp, MD  losartan (COZAAR) 50 MG tablet Take 1 tablet (50 mg total) by mouth daily. 04/10/20  Yes Marin Olp, MD  meloxicam (MOBIC) 15 MG tablet Take 1 tablet (15 mg total) by mouth daily. 08/28/20  Yes Marin Olp, MD  omeprazole (PRILOSEC) 40 MG capsule TAKE (1) CAPSULE BY MOUTH ONCE DAILY 30 MINTUES BEFORE BREAKFAST. Patient taking differently: Take 40 mg by mouth daily.  [place] 08/30/20  Yes Marin Olp, MD  sildenafil (VIAGRA) 100 MG tablet Take 1 tablet (100 mg total) by mouth daily as needed for erectile dysfunction. 04/10/20  Yes Marin Olp, MD  simvastatin (ZOCOR) 80 MG tablet TAKE ONE TABLET BY MOUTH ONCE DAILY Patient taking differently: Take 40 mg by mouth at bedtime. 01/12/21  Yes Marin Olp, MD  gabapentin (NEURONTIN) 300 MG capsule Take 1 capsule (300 mg total) by mouth 3 (three) times daily as needed. Patient taking differently: Take 300 mg by mouth 3 (three) times daily as needed (Nerve pain). 10/09/20   Gregor Hams, MD     Review of Systems  Positive ROS: As above  All other systems have been reviewed and were otherwise negative with the exception of those mentioned in the HPI and as above.  Objective: Vital signs in last 24 hours: Temp:  [98.3 F (36.8 C)] 98.3 F (36.8 C) (07/25 0715) Resp:  [18] 18 (07/25 0715) BP: (129)/(72)  129/72 (07/25 0715) SpO2:  [98 %] 98 % (07/25 0715) Weight:  [99.8 kg] 99.8 kg (07/25 0715) Estimated body mass index is 30.68 kg/m as calculated from the following:   Height as of this encounter: '5\' 11"'$  (1.803 m).   Weight as of this encounter: 99.8 kg.   General Appearance: Alert Head: Normocephalic, without obvious abnormality, atraumatic Eyes: PERRL, conjunctiva/corneas clear, EOM's intact,    Ears: Normal  Throat: Normal  Neck: Supple, Back: unremarkable Lungs: Clear to auscultation bilaterally, respirations unlabored Heart: Regular rate and rhythm, no murmur, rub or gallop Abdomen: Soft, non-tender Extremities: Extremities normal, atraumatic, no cyanosis or edema Skin: unremarkable  NEUROLOGIC:   Mental status: alert and oriented,Motor Exam - grossly normal Sensory Exam - grossly normal Reflexes:  Coordination - grossly normal Gait - grossly normal Balance - grossly normal Cranial Nerves: I: smell Not tested  II: visual acuity  OS: Normal  OD: Normal   II: visual fields Full to confrontation  II: pupils Equal, round, reactive to light  III,VII: ptosis None  III,IV,VI: extraocular muscles  Full ROM  V: mastication Normal  V: facial light touch sensation  Normal  V,VII: corneal reflex  Present  VII: facial muscle function - upper  Normal  VII: facial muscle function - lower Normal  VIII: hearing Not tested  IX: soft palate elevation  Normal  IX,X: gag reflex Present  XI: trapezius strength  5/5  XI: sternocleidomastoid strength 5/5  XI: neck flexion strength  5/5  XII: tongue strength  Normal    Data Review Lab Results  Component Value Date   WBC 5.8 02/19/2021   HGB 12.9 (L) 02/19/2021   HCT 39.2 02/19/2021   MCV 99.7 02/19/2021   PLT 135 (L) 02/19/2021   Lab Results  Component Value Date   NA 137 02/19/2021   K 4.1 02/19/2021   CL 106 02/19/2021   CO2 24 02/19/2021   BUN 15 02/19/2021   CREATININE 1.19 02/19/2021   GLUCOSE 123 (H) 02/19/2021    No results found for: INR, PROTIME  Assessment/Plan: Right L4-5 spinal stenosis, lumbar radiculopathy, lumbago: I have discussed the situation with the patient.  I reviewed his imaging studies with him and pointed out the abnormalities.  We have discussed the various treatment options including surgery.  I have described the surgical treatment option of a right L4-5 laminotomy/foraminotomies.  I have shown him surgical models.  I have given him a surgical pamphlet.  We have discussed  the risk, benefits, alternatives, expected postoperative course, and likelihood of achieving our goals with surgery.  I have answered all his questions.  He has decided proceed with surgery.   Ophelia Charter 02/19/2021 10:34 AM

## 2021-02-20 ENCOUNTER — Encounter (HOSPITAL_COMMUNITY): Payer: Self-pay | Admitting: Neurosurgery

## 2021-03-05 ENCOUNTER — Ambulatory Visit: Payer: Medicare Other | Admitting: Cardiovascular Disease

## 2021-03-19 ENCOUNTER — Telehealth: Payer: Self-pay | Admitting: Pharmacist

## 2021-03-19 NOTE — Chronic Care Management (AMB) (Addendum)
Chronic Care Management Pharmacy Assistant   Name: William Park  MRN: [id number] DOB: 14-Jun-1951  Reason for Encounter: General Adherence Call    Recent office visits:  None  Recent consult visits:  01/30/2021 Eric Form MD, Leonie Douglas; [place], no further information available.  Hospital visits:  Admitted 02/19/2021 for Elective Surgery (Right L4-5 laminotomy/foraminotomies using micro-dissection, Discharged on 02/19/2021  Hospital Course: Patient underwent a right L4-5 laminotomy/foraminotomies by Dr. Arnoldo Morale on 02/19/2021. He was admitted to 3C08 following recovery from anesthesia in the PACU. His postoperative course has been uncomplicated. He has worked with physical therapy, who feel the patient is ready for discharge home. He is ambulating independently and without difficulty. He is tolerating a normal diet. He is not having any bowel or bladder dysfunction. His pain is well-controlled with oral pain medication. He is ready for discharge to home.  Medications: Outpatient Encounter Medications as of 03/19/2021  Medication Sig   aspirin 81 MG tablet Take 81 mg by mouth daily.   busPIRone (BUSPAR) 5 MG tablet Take 1 tablet (5 mg total) by mouth 3 (three) times daily as needed (unknown).   cyclobenzaprine (FLEXERIL) 10 MG tablet Take 1 tablet (10 mg total) by mouth 3 (three) times daily as needed for muscle spasms.   docusate sodium (COLACE) 100 MG capsule Take 1 capsule (100 mg total) by mouth 2 (two) times daily.   famotidine (PEPCID) 20 MG tablet Take 1 tablet (20 mg total) by mouth 2 (two) times daily.   fluticasone (FLONASE) 50 MCG/ACT nasal spray Place 2 sprays into both nostrils daily as needed for allergies or rhinitis.   gabapentin (NEURONTIN) 300 MG capsule Take 1 capsule (300 mg total) by mouth 3 (three) times daily as needed (Nerve pain).   losartan (COZAAR) 50 MG tablet Take 1 tablet (50 mg total) by mouth daily.   meloxicam  (MOBIC) 15 MG tablet Take 1 tablet (15 mg total) by mouth daily. Restart 02/24/2021   omeprazole (PRILOSEC) 40 MG capsule Take 1 capsule (40 mg total) by mouth daily. [place].   oxyCODONE (OXY IR/ROXICODONE) 5 MG immediate release tablet Take 1 tablet (5 mg total) by mouth every 4 (four) hours as needed for moderate pain ((score 4 to 6)).   sildenafil (VIAGRA) 100 MG tablet Take 1 tablet (100 mg total) by mouth daily as needed for erectile dysfunction.   simvastatin (ZOCOR) 80 MG tablet Take 0.5 tablets (40 mg total) by mouth at bedtime.   No facility-administered encounter medications on file as of 03/19/2021.   Patient Questions: Have you had any problems recently with your health? Patient states he is healing well from his surgery. Patient states he still has some minor pains.  Have you had any problems with your pharmacy? Patient states he has not had any problems with his pharmacy.  What issues or side effects are you having with your medications? Patient states he is not currently having any issues or side effects with any of his medications.  What would you like me to pass along to Leata Mouse, CPP for him to help you with?  Patient states he would like a refill on his oxycodone. (I called and requested this [facility].)  What can we do to take care of you better? Patient did not have any suggestions.  Future Appointments  Date Time Provider [place]  04/09/2021  3:00 PM Marin Olp, MD [facility]  04/12/2021  8:45 AM [facility] HEALTH COACH [facility]  05/01/2021 11:00 AM Troy Sine, MD [facility]    Star Rating Drugs: Losartan 50 mg last filled 01/18/2021 90 DS Simvastatin 80 mg last filled 10/23/2020 90 DS  April D Calhoun, [place] Pharmacist Assistant [phone]

## 2021-03-28 DIAGNOSIS — F321 Major depressive disorder, single episode, moderate: Secondary | ICD-10-CM | POA: Diagnosis not present

## 2021-03-28 DIAGNOSIS — F322 Major depressive disorder, single episode, severe without psychotic features: Secondary | ICD-10-CM | POA: Diagnosis not present

## 2021-03-29 DIAGNOSIS — F322 Major depressive disorder, single episode, severe without psychotic features: Secondary | ICD-10-CM | POA: Diagnosis not present

## 2021-03-29 DIAGNOSIS — F321 Major depressive disorder, single episode, moderate: Secondary | ICD-10-CM | POA: Diagnosis not present

## 2021-04-04 DIAGNOSIS — F321 Major depressive disorder, single episode, moderate: Secondary | ICD-10-CM | POA: Diagnosis not present

## 2021-04-04 DIAGNOSIS — F322 Major depressive disorder, single episode, severe without psychotic features: Secondary | ICD-10-CM | POA: Diagnosis not present

## 2021-04-06 ENCOUNTER — Telehealth: Payer: Self-pay | Admitting: Pharmacist

## 2021-04-06 NOTE — Chronic Care Management (AMB) (Addendum)
Chronic Care Management Pharmacy Assistant   Name: William Park  MRN: [id number] DOB: 1950-11-08   Reason for Encounter: Medication coordination call     Recent office visits:  None since 03/19/21  Recent consult visits:  None since 03/19/21  Hospital visits:  None since 03/19/21  Medications: Outpatient Encounter Medications as of 04/06/2021  Medication Sig   aspirin 81 MG tablet Take 81 mg by mouth daily.   busPIRone (BUSPAR) 5 MG tablet Take 1 tablet (5 mg total) by mouth 3 (three) times daily as needed (unknown).   cyclobenzaprine (FLEXERIL) 10 MG tablet Take 1 tablet (10 mg total) by mouth 3 (three) times daily as needed for muscle spasms.   docusate sodium (COLACE) 100 MG capsule Take 1 capsule (100 mg total) by mouth 2 (two) times daily.   famotidine (PEPCID) 20 MG tablet Take 1 tablet (20 mg total) by mouth 2 (two) times daily.   fluticasone (FLONASE) 50 MCG/ACT nasal spray Place 2 sprays into both nostrils daily as needed for allergies or rhinitis.   gabapentin (NEURONTIN) 300 MG capsule Take 1 capsule (300 mg total) by mouth 3 (three) times daily as needed (Nerve pain).   losartan (COZAAR) 50 MG tablet Take 1 tablet (50 mg total) by mouth daily.   meloxicam (MOBIC) 15 MG tablet Take 1 tablet (15 mg total) by mouth daily. Restart 02/24/2021   omeprazole (PRILOSEC) 40 MG capsule Take 1 capsule (40 mg total) by mouth daily. [place].   oxyCODONE (OXY IR/ROXICODONE) 5 MG immediate release tablet Take 1 tablet (5 mg total) by mouth every 4 (four) hours as needed for moderate pain ((score 4 to 6)).   sildenafil (VIAGRA) 100 MG tablet Take 1 tablet (100 mg total) by mouth daily as needed for erectile dysfunction.   simvastatin (ZOCOR) 80 MG tablet Take 0.5 tablets (40 mg total) by mouth at bedtime.   No facility-administered encounter medications on file as of 04/06/2021.   Reviewed chart for medication changes ahead of medication coordination  call.  No OVs, Consults, or hospital visits since last care coordination call/Pharmacist visit. (If appropriate, list visit date, provider name)  No medication changes indicated OR if recent visit, treatment plan here.  BP Readings from Last 3 Encounters:  02/19/21 140/78  11/20/20 131/71  10/25/20 (!) 164/80    No results found for: HGBA1C    Patient obtains medications through Vials  90 Days   Last adherence delivery included: (medication name and frequency) Omeprazole 40 mg Take one capsule by mouth once daily Losartan 50 mg Take one tab by mouth once daily Famotidine 20 mg Take one tab by mouth twice daily fluticasone 50 MCG/ACT nasal spray Meloxicam 15 mg- Take one tab by mouth once daily   Patient is due for next adherence delivery on: 04/16/21  Called patient and reviewed medications and coordinated delivery.  This delivery to include: Omeprazole 40 mg Take one capsule by mouth once daily Losartan 50 mg Take one tab by mouth once daily Famotidine 20 mg Take one tab by mouth twice daily Meloxicam 15 mg- Take one tab by mouth once daily  Declined: Fluticasone 14mg prn - plenty at home add to next delivery  Coordinated acute fill for (Buspirone '5mg'$  ) to be delivered (9/14/2.  Confirmed delivery date of 04/17/21, advised patient that pharmacy will contact them the morning of delivery.   Care Gaps: Medicare Annual Wellness:Last 04/12/20 Ophthalmology Exam: N/A Foot Exam: N/A Hemoglobin A1C: N/A Colonoscopy:  Next due on 07/05/29 Dexa Scan: N/A   Star Rating Drugs: Losartan 50 mg last filled 01/18/2021 90 DS Simvastatin 80 mg last filled 10/23/2020 90 DS  Future Appointments  Date Time Provider [place]  04/09/2021  3:00 PM Marin Olp, MD [facility]  04/12/2021  8:45 AM [facility] HEALTH COACH [facility]  05/01/2021 11:00 AM Troy Sine, MD [facility] [place]    8 minutes spent in  review, coordination, and documentation.  Reviewed by: Beverly Milch, PharmD Clinical Pharmacist [phone]

## 2021-04-08 ENCOUNTER — Other Ambulatory Visit: Payer: Self-pay | Admitting: Family Medicine

## 2021-04-09 ENCOUNTER — Other Ambulatory Visit: Payer: Self-pay | Admitting: Family Medicine

## 2021-04-09 ENCOUNTER — Other Ambulatory Visit: Payer: Self-pay

## 2021-04-09 ENCOUNTER — Encounter: Payer: Self-pay | Admitting: Family Medicine

## 2021-04-09 ENCOUNTER — Ambulatory Visit (INDEPENDENT_AMBULATORY_CARE_PROVIDER_SITE_OTHER): Payer: Medicare Other | Admitting: Family Medicine

## 2021-04-09 VITALS — BP 124/86 | HR 89 | Temp 98.5°F | Resp 16 | Wt 218.4 lb

## 2021-04-09 DIAGNOSIS — M5441 Lumbago with sciatica, right side: Secondary | ICD-10-CM

## 2021-04-09 DIAGNOSIS — F3341 Major depressive disorder, recurrent, in partial remission: Secondary | ICD-10-CM | POA: Diagnosis not present

## 2021-04-09 DIAGNOSIS — E785 Hyperlipidemia, unspecified: Secondary | ICD-10-CM

## 2021-04-09 DIAGNOSIS — Z23 Encounter for immunization: Secondary | ICD-10-CM | POA: Diagnosis not present

## 2021-04-09 DIAGNOSIS — H9193 Unspecified hearing loss, bilateral: Secondary | ICD-10-CM | POA: Diagnosis not present

## 2021-04-09 DIAGNOSIS — K219 Gastro-esophageal reflux disease without esophagitis: Secondary | ICD-10-CM | POA: Diagnosis not present

## 2021-04-09 DIAGNOSIS — G8929 Other chronic pain: Secondary | ICD-10-CM | POA: Diagnosis not present

## 2021-04-09 DIAGNOSIS — H9312 Tinnitus, left ear: Secondary | ICD-10-CM | POA: Diagnosis not present

## 2021-04-09 DIAGNOSIS — I2510 Atherosclerotic heart disease of native coronary artery without angina pectoris: Secondary | ICD-10-CM

## 2021-04-09 DIAGNOSIS — I10 Essential (primary) hypertension: Secondary | ICD-10-CM

## 2021-04-09 MED ORDER — CYCLOBENZAPRINE HCL 10 MG PO TABS: 10.0000 mg | ORAL_TABLET | Freq: Three times a day (TID) | ORAL | 2 refills | Status: DC | PRN

## 2021-04-09 MED ORDER — BUSPIRONE HCL 5 MG PO TABS: 5.0000 mg | ORAL_TABLET | Freq: Three times a day (TID) | ORAL | 3 refills | Status: DC | PRN

## 2021-04-09 NOTE — Progress Notes (Signed)
Phone [phone] In person visit   Subjective:   William Park is a 70 y.o. year old very pleasant male patient who presents for/with See problem oriented charting Chief Complaint  Patient presents with   6 months F/u   Hypertension    This visit occurred during the SARS-CoV-2 public health emergency.  Safety protocols were in place, including screening questions prior to the visit, additional usage of staff PPE, and extensive cleaning of exam room while observing appropriate contact time as indicated for disinfecting solutions.   Past Medical History-  Patient Active Problem List   Diagnosis Date Noted   CAD (coronary artery disease)     Priority: High   Major depressive disorder with single episode, in full remission ([place]) 05/12/2018    Priority: Medium   Hyperlipidemia     Priority: Medium   Hypertension     Priority: Medium   GERD (gastroesophageal reflux disease)     Priority: Medium   Osteoarthritis     Priority: Medium   Former smoker 01/06/2015    Priority: Low   Allergic rhinitis     Priority: Low   Spinal stenosis of lumbar region with neurogenic claudication 02/19/2021   Peroneal tendinitis, right 05/18/2018   Anemia 08/13/2016    Medications- reviewed and updated Current Outpatient Medications  Medication Sig Dispense Refill   aspirin 81 MG tablet Take 81 mg by mouth daily.     docusate sodium (COLACE) 100 MG capsule Take 1 capsule (100 mg total) by mouth 2 (two) times daily. 10 capsule 0   famotidine (PEPCID) 20 MG tablet Take 1 tablet (20 mg total) by mouth 2 (two) times daily. 180 tablet 3   fluticasone (FLONASE) 50 MCG/ACT nasal spray Place 2 sprays into both nostrils daily as needed for allergies or rhinitis.     losartan (COZAAR) 50 MG tablet TAKE ONE TABLET BY MOUTH ONCE DAILY 90 tablet 1   meloxicam (MOBIC) 15 MG tablet Take 1 tablet (15 mg total) by mouth daily. Restart 02/24/2021 90 tablet 3   omeprazole (PRILOSEC) 40 MG capsule Take 1  capsule (40 mg total) by mouth daily. [place].     oxyCODONE (OXY IR/ROXICODONE) 5 MG immediate release tablet Take 1 tablet (5 mg total) by mouth every 4 (four) hours as needed for moderate pain ((score 4 to 6)). 42 tablet 0   sildenafil (VIAGRA) 100 MG tablet Take 1 tablet (100 mg total) by mouth daily as needed for erectile dysfunction. 100 tablet 11   simvastatin (ZOCOR) 80 MG tablet Take 0.5 tablets (40 mg total) by mouth at bedtime.     busPIRone (BUSPAR) 5 MG tablet Take 1 tablet (5 mg total) by mouth 3 (three) times daily as needed. 270 tablet 3   cyclobenzaprine (FLEXERIL) 10 MG tablet Take 1 tablet (10 mg total) by mouth 3 (three) times daily as needed for muscle spasms (do not drive for 8 hours after taking). 30 tablet 2   No current facility-administered medications for this visit.     Objective:  BP 124/86   Pulse 89   Temp 98.5 F (36.9 C)   Resp 16   Wt 218 lb 6.4 oz (99.1 kg)   SpO2 93%   BMI 30.46 kg/m  Gen: NAD, resting comfortably CV: RRR no murmurs rubs or gallops Lungs: CTAB no crackles, wheeze, rhonchi Ext: trace edema Skin: warm, dry    Assessment and Plan   #social update- some struggles with GF (on  the outs), daughters are drug addicts and one lost her child to DSS- other got out of prison but didn't listen to parole officer- really hard on him  # Depression/anxiety S: Medication:none for depression, buspirone helpful scheduled 3x a day for anxiety Depression screen [facility] 2/9 04/09/2021 08/28/2020 04/10/2020  Decreased Interest 3 0 0  Down, Depressed, Hopeless 0 0 1  PHQ - 2 Score 3 0 1  Altered sleeping 0 0 2  Tired, decreased energy 2 0 1  Change in appetite 0 0 1  Feeling bad or failure about yourself  0 0 1  Trouble concentrating 0 0 1  Moving slowly or fidgety/restless 0 0 1  Suicidal thoughts 0 0 1  PHQ-9 Score 5 0 9  Difficult doing work/chores Not difficult at all Not difficult at all -   A/P: Considering tremendous amount  of stress patient is under-I think he is actually doing quite well.  I am thrilled he is started seeing a therapist in [place].  His scores are reasonably controlled considering stressors-we opted to continue buspirone for anxiety.  He will let me know if any worsening symptoms despite therapy-happy to see him sooner if needed in 6 months  #Spinal stenosis of lumbar region with neurogenic claudication S:Had laminotomy/foraminotomy surgery on 02/19/2021 with Viona Gilmore, NP.  -medications given at discharge: Cyclobenzaprine 10 mg as needed, Oxycodone 5 mg as needed.  Patient was having severe pain at last visit from back to his his right thigh behind knee to his groin occasionally as well.  He was using meloxicam at last visit which was somewhat helpful.  Patient reports did well for a month then went back to work and starting to feel more pain going into right leg again - has follow up in November. Still needing meloxicam daily for radicular pain and hand arthritis    - he is needing oxycodone and flexeril for pain-   A/P: Patient did well initially after surgery but has had resurgence of pain-strongly encouraged to follow-up with neurosurgery.  I did refill his Flexeril but asked him to discuss oxycodone refills with neurosurgery-since he is in that bad of pain we really need their expert input  #hypertension S: medication: Losartan 50 mg daily Home readings #s: well controlled at home BP Readings from Last 3 Encounters:  04/09/21 124/86  02/19/21 140/78  11/20/20 131/71  A/P: Controlled. Continue current medications.   #CAD-denied chest pain or shortness of breath.  Compliant with aspirin 81 mg daily  #hyperlipidemia S: Medication: Simvastatin 80 mg daily Lab Results  Component Value Date   CHOL 141 04/10/2020   HDL 52 04/10/2020   LDLCALC 62 04/10/2020   TRIG 197 (H) 04/10/2020   CHOLHDL 2.7 04/10/2020   A/P: Lipids well-controlled last year-update lipid panel today.  CAD  remains asymptomatic-continue current medications - recently had labs in [place] - we will request these- apparently even checked PSA  # GERD S:Medication: Omeprazole 40 mg daily and Pepcid 20 twice daily.  -Follows with GI in [place]- 2020 EGD- has been stable since then- had mild gastritis A/P: with ongoing high dose need- reasonable to check back in with GI- encouraged him to call to schedule GI follow up   # tinnitus S:off and on for last several months. Does have some hearing loss- was told at [employer] physical . Mainly left ear he believes A/P: constant buzz and not pulsatile - offered ENT referral    Recommended follow up: Return in about 6 months (around  10/07/2021) for follow-up or sooner if needed.. Future Appointments  Date Time Provider [place]  05/01/2021 11:00 AM Troy Sine, MD [facility]   Lab/Order associations:   ICD-10-CM   1. Coronary artery disease involving native coronary artery of native heart without angina pectoris  I25.10     2. Hyperlipidemia, unspecified hyperlipidemia type  E78.5     3. Primary hypertension  I10     4. Recurrent major depressive disorder, in partial remission ([place])  F33.41     5. Chronic right-sided low back pain with right-sided sciatica  M54.41    G89.29     6. Gastroesophageal reflux disease, unspecified whether esophagitis present  K21.9       Meds ordered this encounter  Medications   busPIRone (BUSPAR) 5 MG tablet    Sig: Take 1 tablet (5 mg total) by mouth 3 (three) times daily as needed.    Dispense:  270 tablet    Refill:  3   cyclobenzaprine (FLEXERIL) 10 MG tablet    Sig: Take 1 tablet (10 mg total) by mouth 3 (three) times daily as needed for muscle spasms (do not drive for 8 hours after taking).    Dispense:  30 tablet    Refill:  2     I,Jada Bradford,acting as a scribe for Garret Reddish, MD.,have documented all relevant documentation on the behalf of Garret Reddish, MD,as directed by   Garret Reddish, MD while in the presence of Garret Reddish, MD.  I, Garret Reddish, MD, have reviewed all documentation for this visit. The documentation on 04/09/21 for the exam, diagnosis, procedures, and orders are all accurate and complete.  Return precautions advised.  Garret Reddish, MD

## 2021-04-09 NOTE — Addendum Note (Signed)
Addended by: Randolm Idol A on: 04/09/2021 04:29 PM   Modules accepted: Orders

## 2021-04-09 NOTE — Patient Instructions (Addendum)
Health Maintenance Due  Topic Date Due   Zoster Vaccines- Shingrix (1 of 2)  - Please check with your pharmacy to see if they have the shingrix vaccine. If they do- please get this immunization and update [place] by phone call or mychart with dates you receive the vaccine  Never done   COVID-19 Vaccine (3 - Booster for Moderna series)  - Please consider new Omicron-Specific shot in the Fall.   05/21/2020   INFLUENZA VACCINE   - today 02/26/2021    We will call you within two weeks about your referral to ENT. If you do not hear within 2 weeks, give [place] a call.   Sign to release information from last labs from Cardiology.   Please call  to schedule GI follow-up.   Recommended follow up: Return in about 6 months (around 10/07/2021) for follow-up or sooner if needed.Marland Kitchen

## 2021-04-10 DIAGNOSIS — F321 Major depressive disorder, single episode, moderate: Secondary | ICD-10-CM | POA: Diagnosis not present

## 2021-04-10 DIAGNOSIS — F322 Major depressive disorder, single episode, severe without psychotic features: Secondary | ICD-10-CM | POA: Diagnosis not present

## 2021-04-12 ENCOUNTER — Ambulatory Visit: Payer: Medicare Other

## 2021-04-20 DIAGNOSIS — M7061 Trochanteric bursitis, right hip: Secondary | ICD-10-CM | POA: Insufficient documentation

## 2021-04-24 DIAGNOSIS — M5441 Lumbago with sciatica, right side: Secondary | ICD-10-CM | POA: Diagnosis not present

## 2021-04-24 DIAGNOSIS — M7061 Trochanteric bursitis, right hip: Secondary | ICD-10-CM | POA: Diagnosis not present

## 2021-04-24 DIAGNOSIS — I10 Essential (primary) hypertension: Secondary | ICD-10-CM | POA: Diagnosis not present

## 2021-04-24 DIAGNOSIS — Z6830 Body mass index (BMI) 30.0-30.9, adult: Secondary | ICD-10-CM | POA: Diagnosis not present

## 2021-04-24 DIAGNOSIS — M48061 Spinal stenosis, lumbar region without neurogenic claudication: Secondary | ICD-10-CM | POA: Diagnosis not present

## 2021-04-26 ENCOUNTER — Encounter: Payer: Self-pay | Admitting: Family Medicine

## 2021-04-26 DIAGNOSIS — M5441 Lumbago with sciatica, right side: Secondary | ICD-10-CM | POA: Diagnosis not present

## 2021-04-28 DIAGNOSIS — I82409 Acute embolism and thrombosis of unspecified deep veins of unspecified lower extremity: Secondary | ICD-10-CM

## 2021-04-28 HISTORY — DX: Acute embolism and thrombosis of unspecified deep veins of unspecified lower extremity: I82.409

## 2021-05-01 ENCOUNTER — Ambulatory Visit (INDEPENDENT_AMBULATORY_CARE_PROVIDER_SITE_OTHER): Payer: Medicare Other | Admitting: Cardiovascular Disease

## 2021-05-01 ENCOUNTER — Encounter: Payer: Self-pay | Admitting: Cardiovascular Disease

## 2021-05-01 ENCOUNTER — Other Ambulatory Visit: Payer: Self-pay

## 2021-05-01 VITALS — BP 165/94 | HR 82 | Ht 71.0 in | Wt 215.8 lb

## 2021-05-01 DIAGNOSIS — E785 Hyperlipidemia, unspecified: Secondary | ICD-10-CM

## 2021-05-01 DIAGNOSIS — I2510 Atherosclerotic heart disease of native coronary artery without angina pectoris: Secondary | ICD-10-CM | POA: Diagnosis not present

## 2021-05-01 DIAGNOSIS — Z9861 Coronary angioplasty status: Secondary | ICD-10-CM

## 2021-05-01 DIAGNOSIS — Z8659 Personal history of other mental and behavioral disorders: Secondary | ICD-10-CM

## 2021-05-01 DIAGNOSIS — E782 Mixed hyperlipidemia: Secondary | ICD-10-CM | POA: Diagnosis not present

## 2021-05-01 DIAGNOSIS — R0602 Shortness of breath: Secondary | ICD-10-CM | POA: Diagnosis not present

## 2021-05-01 MED ORDER — AMLODIPINE BESYLATE 5 MG PO TABS: 5.0000 mg | ORAL_TABLET | Freq: Every day | ORAL | 3 refills | Status: DC

## 2021-05-01 MED ORDER — METOPROLOL TARTRATE 100 MG PO TABS: ORAL_TABLET | ORAL | 0 refills | Status: DC

## 2021-05-01 NOTE — Progress Notes (Addendum)
Cardiology Office Note    Date:  05/08/2021   ID:  William Park, DOB [birth year], MRN [id number]  PCP:  Marin Olp, MD  Cardiologist:  Shelva Majestic, MD   New cardiology evaluation referred through the courtesy of Dr. Garret Reddish for establishment of cardiology care.  History of Present Illness:  William Park is a 70 y.o. male who has a history of known CAD, and apparently had undergone remote PTCA by me in 1990 currently had 3 blockages.  He underwent a subsequent PTCA in 1991 and his last catheterization was in 1994.  Subsequently, his insurance changed, and he has been evaluated by [name] in [place] for his cardiology care.  States his last treadmill test was approximately 2 years ago and he was told that this was "okay."He has had some difficulty with family members with a daughter being a drug addict and having served time in prison and is getting back in prison and another daughter with issues.  He has been followed by Dr. Garret Reddish for his primary care.  He had undergone a lower extremity arterial duplex study in July 2022 due to leg swelling and there was no evidence for DVT.  Over the last 10 months, he has started to notice slight progressive development of exertional shortness of breath with less activity.  He denies any chest pressure.  He is unaware of any arrhythmias.  He denies PND or orthopnea.  He has had some back and right leg discomfort.  Has a history of depression and is on risperidone.  He has been on losartan 50 mg twice a day for hypertension, famotidine for GERD in addition at times to omeprazole.  He is on simvastatin 40 mg for hyperlipidemia and apparently has a prescription for [place] a fill as needed for erectile dysfunction.  He now presents for establishment of cardiology care again in [place] instead of [place] and presents for evaluation.  Past Medical History:  Diagnosis Date   Allergic rhinitis    allegra  and flonase   CAD (coronary artery disease)    Dr. [name] s/p 4 angioplasty's with last in 50. ASA. simvastatin $RemoveBeforeDE'80mg'OHaPhdiwMXHWnMf$    GERD (gastroesophageal reflux disease)    nexium $Remov'40mg'dOiCva$  and zantac BID-mout fills with saliva and coughing. Seeing GI in [place] 12/2014   Hyperlipidemia    simvastatin $RemoveBefo'80mg'kjkQDgkfyLs$ , niaspan $RemoveB'750mg'BzZeqnjp$    Hypertension    losartan $RemoveB'50mg'sfsIbUeM$    Myocardial infarction ([facility])    "small MI, mild"- 1994   Osteoarthritis    mainly hands. Mobic $RemoveBefo'15mg'rCpqNDVdbuJ$     Past Surgical History:  Procedure Laterality Date   ANGIOPLASTY     x4   COLONOSCOPY     EYE SURGERY     LUMBAR LAMINECTOMY/DECOMPRESSION MICRODISCECTOMY Right 02/19/2021   Procedure: L4-L5 LAMINOTOMY/FORAMINOTOMY;  Surgeon: Newman Pies, MD;  Location: [facility];  Service: Neurosurgery;  Laterality: Right;  3C   pyloric stenosis     as a child   TONSILLECTOMY     UPPER GASTROINTESTINAL ENDOSCOPY      Current Medications: Outpatient Medications Prior to Visit  Medication Sig Dispense Refill   aspirin 81 MG tablet Take 81 mg by mouth daily.     busPIRone (BUSPAR) 5 MG tablet Take 1 tablet (5 mg total) by mouth 3 (three) times daily as needed. 270 tablet 3   cyclobenzaprine (FLEXERIL) 10 MG tablet Take 1 tablet (10 mg total) by mouth 3 (three) times daily as needed for muscle spasms (do not drive  for 8 hours after taking). 30 tablet 2   docusate sodium (COLACE) 100 MG capsule Take 1 capsule (100 mg total) by mouth 2 (two) times daily. 10 capsule 0   famotidine (PEPCID) 20 MG tablet Take 1 tablet (20 mg total) by mouth 2 (two) times daily. 180 tablet 3   fluticasone (FLONASE) 50 MCG/ACT nasal spray Place 2 sprays into both nostrils daily as needed for allergies or rhinitis.     losartan (COZAAR) 50 MG tablet TAKE ONE TABLET BY MOUTH ONCE DAILY (Patient taking differently: 50 mg in the morning and at bedtime.) 90 tablet 1   meloxicam (MOBIC) 15 MG tablet Take 1 tablet (15 mg total) by mouth daily. Restart 02/24/2021 90 tablet 3   omeprazole  (PRILOSEC) 40 MG capsule Take 1 capsule (40 mg total) by mouth daily. [place].     oxyCODONE (OXY IR/ROXICODONE) 5 MG immediate release tablet Take 1 tablet (5 mg total) by mouth every 4 (four) hours as needed for moderate pain ((score 4 to 6)). 42 tablet 0   sildenafil (VIAGRA) 100 MG tablet Take 1 tablet (100 mg total) by mouth daily as needed for erectile dysfunction. 100 tablet 11   simvastatin (ZOCOR) 80 MG tablet Take 0.5 tablets (40 mg total) by mouth at bedtime.     No facility-administered medications prior to visit.     Allergies:   Patient has no known allergies.   Social History   Socioeconomic History   Marital status: Divorced    Spouse name: Not on file   Number of children: Not on file   Years of education: Not on file   Highest education level: Not on file  Occupational History   Not on file  Tobacco Use   Smoking status: Former    Packs/day: 2.00    Years: 25.00    Pack years: 50.00    Types: Cigarettes    Quit date: 07/30/1987    Years since quitting: 33.7   Smokeless tobacco: Never  Vaping Use   Vaping Use: Never used  Substance and Sexual Activity   Alcohol use: Not Currently   Drug use: No   Sexual activity: Not on file  Other Topics Concern   Not on file  Social History Narrative   Family: Divorced. 2 daughters. 3 granddaughters. Doesn't get to see them.       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   He has 2 children.  He previously worked as a as a Administrator.  He denies tobacco use.  He does drink occasional alcohol.  He does not exercise.  Family History:  The patient's family history includes AAA (abdominal aortic aneurysm) in his father; Breast cancer in his mother; CAD in his brother and father; Congestive Heart Failure in his mother; Dementia in his father; Kidney disease in his father.   His mother  died at age 75 with cancer.  Father died at 23 and had heart disease and kidney problems.  He has a brother age 71 with heart disease and another brother age 83.  ROS General: Negative; No fevers, chills, or night sweats;  HEENT: Occasional tinnitus,  No changes in vision or hearing, sinus congestion, difficulty swallowing Pulmonary: Negative; No cough, wheezing, shortness of breath, hemoptysis Cardiovascular: Negative; No chest pain, presyncope, syncope, palpitations GI: Positive for GERD GU: Negative; No dysuria, hematuria, or difficulty voiding Musculoskeletal: Positive for back discomfort, status postlaminectomy and foramina ectomy July 2022. Hematologic/Oncology: Negative; no easy bruising, bleeding Endocrine: Negative; no heat/cold intolerance; no diabetes Neuro: Negative; no changes in balance, headaches Skin: Negative; No rashes or skin lesions Psychiatric: Positive for depression Sleep: Negative; No snoring, daytime sleepiness, hypersomnolence, bruxism, restless legs, hypnogognic hallucinations, no cataplexy Other comprehensive 14 point system review is negative.   PHYSICAL EXAM:   VS:  BP (!) 165/94   Pulse 82   Ht $R'5\' 11"'lH$  (1.803 m)   Wt 215 lb 12.8 oz (97.9 kg)   SpO2 97%   BMI 30.10 kg/m     Repeat blood pressure by me was 178/94  Wt Readings from Last 3 Encounters:  05/01/21 215 lb 12.8 oz (97.9 kg)  04/09/21 218 lb 6.4 oz (99.1 kg)  02/19/21 220 lb (99.8 kg)    General: Alert, oriented, no distress.  Skin: normal turgor, no rashes, warm and dry HEENT:  Normocephalic, atraumatic. Pupils equal round and reactive to light; sclera anicteric; extraocular muscles intact; Nose without nasal septal hypertrophy Mouth/Parynx benign; Mallinpatti scale 3 Neck: No JVD, no carotid bruits; normal carotid upstroke Lungs: clear to ausculatation and percussion; no wheezing or rales Chest wall: without tenderness to palpitation Heart: PMI not displaced, RRR, s1 s2 normal, 1/6 systolic murmur, no diastolic murmur, no rubs, gallops, thrills, or heaves Abdomen: soft, nontender; no hepatosplenomehaly, BS+; abdominal aorta nontender and not dilated by palpation. Back: no CVA tenderness Pulses 2+ Musculoskeletal: full range of motion, normal strength, no joint deformities Extremities: no clubbing cyanosis or edema, Homan's sign negative  Neurologic: grossly nonfocal; Cranial nerves grossly wnl Psychologic: Normal mood and affect   Studies/Labs Reviewed:   EKG:  EKG is ordered today. ECG (independently read by me):  NSR at 82, nonspecific T changes, QTc 486 msec  Recent Labs: BMP Latest Ref Rng & Units 05/01/2021 02/19/2021 09/26/2020  Glucose 70 - 99 mg/dL 89 123(H) 90  BUN 8 - 27 mg/dL $Remove'16 15 21  'wlssSJH$ Creatinine 0.76 - 1.27 mg/dL 1.14 1.19 1.14  BUN/Creat Ratio 10 - 24 14 - -  Sodium 134 - 144 mmol/L 144 137 140  Potassium 3.5 - 5.2 mmol/L 4.7 4.1 4.1  Chloride 96 - 106 mmol/L 99 106 103  CO2 20 - 29 mmol/L $RemoveB'24 24 30  'PSUMxHUF$ Calcium 8.6 - 10.2 mg/dL 9.6 8.7(L) 9.3     Hepatic Function Latest Ref Rng & Units 05/01/2021 09/26/2020 04/10/2020  Total Protein 6.0 - 8.5 g/dL 7.5 6.6 6.3  Albumin 3.8 - 4.8 g/dL 4.6 3.9 -  AST 0 - 40 IU/L $Remov'23 19 17  'FJrsMD$ ALT 0 - 44 IU/L $Remov'20 17 15  'qaxNkZ$ Alk Phosphatase 44 - 121 IU/L 113 56 -  Total Bilirubin 0.0 - 1.2 mg/dL 1.4(H) 0.5 0.8  Bilirubin, Direct 0.0 - 0.3 mg/dL - - -    CBC Latest Ref Rng & Units 05/01/2021 02/19/2021 04/10/2020  WBC 3.4 - 10.8 x10E3/uL 10.6 5.8 7.8  Hemoglobin 13.0 - 17.7 g/dL  15.5 12.9(L) 14.2  Hematocrit 37.5 - 51.0 %  44.8 39.2 40.9  Platelets 150 - 450 x10E3/uL 163 135(L) 147   Lab Results  Component Value Date   MCV 96 05/01/2021   MCV 99.7 02/19/2021   MCV 97.8 04/10/2020   Lab Results  Component Value Date   TSH 1.010 05/01/2021   No results found for: HGBA1C   BNP No results found for: BNP  ProBNP No results found for: PROBNP   Lipid Panel     Component Value Date/Time   CHOL 184 05/01/2021 1207   TRIG 115 05/01/2021 1207   HDL 82 05/01/2021 1207   CHOLHDL 2.2 05/01/2021 1207   CHOLHDL 2.7 04/10/2020 1516   VLDL 17.8 07/31/2016 0759   LDLCALC 82 05/01/2021 1207   LDLCALC 62 04/10/2020 1516   LABVLDL 20 05/01/2021 1207     RADIOLOGY: CT CORONARY MORPH W/CTA COR W/SCORE W/CA W/CM &/OR WO/CM  Result Date: 05/08/2021 CLINICAL DATA:  Chest pain EXAM: Cardiac CTA MEDICATIONS: Sub lingual nitro. $RemoveBefore'4mg'dznhwkbuYBEMx$  and lopressor $RemoveBefor'100mg'ZvuAUjVacfhX$  TECHNIQUE: The patient was scanned on a [employer] 573 slice scanner. Gantry rotation speed was 250 msecs. Collimation was .6 mm. A 100 kV prospective scan was triggered in the ascending thoracic aorta at 140 HU's Full mA was used between 35% and 75% of the R-R interval. Average HR during the scan was 57 bpm. The 3D data set was interpreted on a dedicated work station using MPR, MIP and VRT modes. A total of 80cc of contrast was used. FINDINGS: Non-cardiac: See separate report from [facility]. No significant findings on limited lung and soft tissue windows. Calcium Score: Severe 3 vessel coronary calcium LM: 0 LAD: 687 RCA: 403 LCX: 108 Total:  1198 Coronary Arteries: Right dominant with no anomalies LM: 1-24% calcified plaque proximally LAD: 40-59% calcified plaque proximal/mid and distal IM: Small vessel 1-24% calcified plaque in mid vessel D1: Large vessel 40-59% calcified plaque proximal and mid Circumflex: 40-59% calcified plaque proximally and mid vessel OM1: 1-24% calcified plaque AV Groove small not well seen RCA: 25-49% calcified plaque proximally with  external remodeling 1-24% mixed plaque mid vessel PDA: Normal PLA: Normal IMPRESSION: 1. Severe 3 vessel calcium score 1198 which is 52 th percentile for age and sex 2.  Normal ascending aortic root diameter 3.2 cm 3. Moderate CAD in D1, LAD and circumflex see description above Study sent for FFR CT Jenkins Rouge Electronically Signed   By: Jenkins Rouge M.D.   On: 05/08/2021 16:29     Additional studies/ records that were reviewed today include:  I reviewed the records of Dr. Garret Reddish.  I did not have records available from his prior angioplasties between 1990 and 1994.  ASSESSMENT:    1. Coronary artery disease involving native coronary artery of native heart without angina pectoris   2. History of PTCAs: William   3. SOB (shortness of breath) on exertion   4. Mixed hyperlipidemia   5. History of depression     PLAN:  Mr.  Hawkin "Laverna Peace" Dema Park is a 70 year old gentleman who has a history of prior CAD and apparently was told of having 3 blockages in 1990 for which he had undergone percutaneous coronary angioplasty procedures.  His last catheterization was in 1994.  Subsequently, he had lost his insurance.  He has had subsequent follow-up cardiology evaluations by [name] in [facility] and apparently approximately 2 years ago underwent routine treadmill testing and was told that this was apparently "okay."  Over the past 10 months, he has started to notice a change with development of exertional shortness of breath with less activity.  He denies associated chest tightness or pressure.  He has had issues with back discomfort and is status postlaminectomy and also has had some right leg discomfort.  Decently, he has been on losartan and with his blood pressure recently being elevated this had been increased to 50 mg twice a day.  His blood pressure today it is elevated consistent with stage II hypertension.  I have recommended the addition of amlodipine 5 mg to his medical regimen.   With his exertional dyspnea, I am also scheduling him to undergo 2D echo Doppler study for assessment of LV systolic and diastolic function and valvular architecture.  He does not have any prior stenting since his interventions were done prior to coronary stent availability.  To further evaluate his coronary obstructive disease I am scheduling him for coronary CTA evaluation.  He has had recent laboratory on February 19, 2021 which revealed a glucose mildly elevated at 123.  Renal function was stable with creatinine of 1.19.  He has been on simvastatin 40 mg for hyperlipidemia.  Lipid studies by Dr. Yong Channel in 03/2020 had shown an LDL cholesterol at 62 although triglycerides were elevated at 197 consistent with a mixed hyperlipidemic pattern.  We will check laboratory today with a comprehensive metabolic panel, CBC, TSH, lipid studies, and LP(a).  He will monitor his blood pressure and heart rate.  ECG shows nonspecific ST changes.  I will see him in approximately 4 weeks for follow-up evaluation.  If at that time blood pressure remains elevated and pulse remains at its current level beta-blocker therapy will most likely be initiated.   Medication Adjustments/Labs and Tests Ordered: Current medicines are reviewed at length with the patient today.  Concerns regarding medicines are outlined above.  Medication changes, Labs and Tests ordered today are listed in the Patient Instructions below. Patient Instructions  Medication Instructions:  Take Metoprolol 100 mg before CT when it gets scheduled.  Amlodipine 5 mg daily   *If you need a refill on your cardiac medications before your next appointment, please call your pharmacy*   Lab Work: CMET, CBC, LIPID, LPa, TSH today  If you have labs (blood work) drawn today and your tests are completely normal, you will receive your results only by: [facility] (if you have MyChart) OR A paper copy in the mail If you have any lab test that is abnormal or we need to  change your treatment, we will call you to review the results.   Testing/Procedures:  Echocardiogram - Your physician has requested that you have an echocardiogram. Echocardiography is a painless test that uses sound waves to create images of your heart. It provides your doctor with information about the size and shape of your heart and how well your heart's chambers and valves are working. This procedure takes approximately one hour. There are no restrictions for this procedure. This will be performed at our [facility] location - [address].  Your physician has requested that you have cardiac CT. Cardiac computed tomography (CT) is a painless test that uses an x-ray machine to take clear, detailed pictures of your heart. For further information please visit [email or web address].[email or web address]. Please follow instruction sheet as given.    Follow-Up: At [facility], you and your health needs are our priority.  As part of our continuing mission to provide you with exceptional heart care,  we have created designated Provider Care Teams.  These Care Teams include your primary Cardiologist (physician) and Advanced Practice Providers (APPs -  Physician Assistants and Nurse Practitioners) who all work together to provide you with the care you need, when you need it.  We recommend signing up for the patient portal called "MyChart".  Sign up information is provided on this After Visit Summary.  MyChart is used to connect with patients for Virtual Visits (Telemedicine).  Patients are able to view lab/test results, encounter notes, upcoming appointments, etc.  Non-urgent messages can be sent to your provider as well.   To learn more about what you can do with MyChart, go to [email or web address].    Your next appointment:   November 3rd at 11:30 AM    Other Instructions   Your cardiac CT will be scheduled at one of the below locations:   [facility] [address] [place],  [place] [address] [phone]  If scheduled at [facility], please arrive at the [facility] main entrance (entrance A) of [facility] 30 minutes prior to test start time. Proceed to the [facility] Radiology Department (first floor) to check-in and test prep.   Please follow these instructions carefully (unless otherwise directed):  Hold all erectile dysfunction medications at least 3 days (72 hrs) prior to test.  On the Night Before the Test: Be sure to Drink plenty of water. Do not consume any caffeinated/decaffeinated beverages or chocolate 12 hours prior to your test. Do not take any antihistamines 12 hours prior to your test.  On the Day of the Test: Drink plenty of water until 1 hour prior to the test. Do not eat any food 4 hours prior to the test. You may take your regular medications prior to the test.  Take metoprolol (Lopressor) two hours prior to test. HOLD Furosemide/Hydrochlorothiazide morning of the test.      After the Test: Drink plenty of water. After receiving IV contrast, you may experience a mild flushed feeling. This is normal. On occasion, you may experience a mild rash up to 24 hours after the test. This is not dangerous. If this occurs, you can take Benadryl 25 mg and increase your fluid intake. If you experience trouble breathing, this can be serious. If it is severe call 911 IMMEDIATELY. If it is mild, please call our office. If you take any of these medications: Glipizide/Metformin, Avandament, Glucavance, please do not take 48 hours after completing test unless otherwise instructed.  Please allow 2-4 weeks for scheduling of routine cardiac CTs. Some insurance companies require a pre-authorization which may delay scheduling of this test.   For non-scheduling related questions, please contact the cardiac imaging nurse navigator should you have any questions/concerns: Marchia Bond, Cardiac Imaging Nurse Navigator Gordy Clement, Cardiac Imaging  Nurse Navigator [facility] Direct Office Dial: [phone]   For scheduling needs, including cancellations and rescheduling, please call [place], [phone].    Signed, Shelva Majestic, MD  05/08/2021 8:10 PM    [facility] [address], [place], [place], [place]  [address] Phone: [phone]

## 2021-05-01 NOTE — Patient Instructions (Addendum)
Medication Instructions:  Take Metoprolol 100 mg before CT when it gets scheduled.  Amlodipine 5 mg daily   *If you need a refill on your cardiac medications before your next appointment, please call your pharmacy*   Lab Work: CMET, CBC, LIPID, LPa, TSH today  If you have labs (blood work) drawn today and your tests are completely normal, you will receive your results only by: [place] (if you have MyChart) OR A paper copy in the mail If you have any lab test that is abnormal or we need to change your treatment, we will call you to review the results.   Testing/Procedures:  Echocardiogram - Your physician has requested that you have an echocardiogram. Echocardiography is a painless test that uses sound waves to create images of your heart. It provides your doctor with information about the size and shape of your heart and how well your heart's chambers and valves are working. This procedure takes approximately one hour. There are no restrictions for this procedure. This will be performed at our [facility] location - [address], [address].  Your physician has requested that you have cardiac CT. Cardiac computed tomography (CT) is a painless test that uses an x-ray machine to take clear, detailed pictures of your heart. For further information please visit [email or web address]. Please follow instruction sheet as given.    Follow-Up: At [facility], you and your health needs are our priority.  As part of our continuing mission to provide you with exceptional heart care, we have created designated Provider Care Teams.  These Care Teams include your primary Cardiologist (physician) and Advanced Practice Providers (APPs -  Physician Assistants and Nurse Practitioners) who all work together to provide you with the care you need, when you need it.  We recommend signing up for the patient portal called "MyChart".  Sign up information is provided on this After Visit Summary.  MyChart  is used to connect with patients for Virtual Visits (Telemedicine).  Patients are able to view lab/test results, encounter notes, upcoming appointments, etc.  Non-urgent messages can be sent to your provider as well.   To learn more about what you can do with MyChart, go to [email or web address].    Your next appointment:   November 3rd at 11:30 AM    Other Instructions   Your cardiac CT will be scheduled at one of the below locations:   [facility] [address] [place], [place] [address] [phone]  If scheduled at [facility], please arrive at the [facility] main entrance (entrance A) of [facility] 30 minutes prior to test start time. Proceed to the [facility] Radiology Department (first floor) to check-in and test prep.   Please follow these instructions carefully (unless otherwise directed):  Hold all erectile dysfunction medications at least 3 days (72 hrs) prior to test.  On the Night Before the Test: Be sure to Drink plenty of water. Do not consume any caffeinated/decaffeinated beverages or chocolate 12 hours prior to your test. Do not take any antihistamines 12 hours prior to your test.  On the Day of the Test: Drink plenty of water until 1 hour prior to the test. Do not eat any food 4 hours prior to the test. You may take your regular medications prior to the test.  Take metoprolol (Lopressor) two hours prior to test. HOLD Furosemide/Hydrochlorothiazide morning of the test.      After the Test: Drink plenty of water. After receiving IV  contrast, you may experience a mild flushed feeling. This is normal. On occasion, you may experience a mild rash up to 24 hours after the test. This is not dangerous. If this occurs, you can take Benadryl 25 mg and increase your fluid intake. If you experience trouble breathing, this can be serious. If it is severe call 911 IMMEDIATELY. If it is mild, please call our office. If you take any of  these medications: Glipizide/Metformin, Avandament, Glucavance, please do not take 48 hours after completing test unless otherwise instructed.  Please allow 2-4 weeks for scheduling of routine cardiac CTs. Some insurance companies require a pre-authorization which may delay scheduling of this test.   For non-scheduling related questions, please contact the cardiac imaging nurse navigator should you have any questions/concerns: Marchia Bond, Cardiac Imaging Nurse Navigator Gordy Clement, Cardiac Imaging Nurse Navigator [facility] Direct Office Dial: [phone]   For scheduling needs, including cancellations and rescheduling, please call [place], [phone].

## 2021-05-02 DIAGNOSIS — F321 Major depressive disorder, single episode, moderate: Secondary | ICD-10-CM | POA: Diagnosis not present

## 2021-05-02 DIAGNOSIS — F322 Major depressive disorder, single episode, severe without psychotic features: Secondary | ICD-10-CM | POA: Diagnosis not present

## 2021-05-02 LAB — COMPREHENSIVE METABOLIC PANEL
ALT: 20 IU/L (ref 0–44)
AST: 23 IU/L (ref 0–40)
Albumin/Globulin Ratio: 1.6 (ref 1.2–2.2)
Albumin: 4.6 g/dL (ref 3.8–4.8)
Alkaline Phosphatase: 113 IU/L (ref 44–121)
BUN/Creatinine Ratio: 14 (ref 10–24)
BUN: 16 mg/dL (ref 8–27)
Bilirubin Total: 1.4 mg/dL — ABNORMAL HIGH (ref 0.0–1.2)
CO2: 24 mmol/L (ref 20–29)
Calcium: 9.6 mg/dL (ref 8.6–10.2)
Chloride: 99 mmol/L (ref 96–106)
Creatinine, Ser: 1.14 mg/dL (ref 0.76–1.27)
Globulin, Total: 2.9 g/dL (ref 1.5–4.5)
Glucose: 89 mg/dL (ref 70–99)
Potassium: 4.7 mmol/L (ref 3.5–5.2)
Sodium: 144 mmol/L (ref 134–144)
Total Protein: 7.5 g/dL (ref 6.0–8.5)
eGFR: 69 mL/min/{1.73_m2} (ref >59)

## 2021-05-02 LAB — CBC
Hematocrit: 44.8 % (ref 37.5–51.0)
Hemoglobin: 15.5 g/dL (ref 13.0–17.7)
MCH: 33 pg (ref 26.6–33.0)
MCHC: 34.6 g/dL (ref 31.5–35.7)
MCV: 96 fL (ref 79–97)
Platelets: 163 10*3/uL (ref 150–450)
RBC: 4.69 x10E6/uL (ref 4.14–5.80)
RDW: 13.3 % (ref 11.6–15.4)
WBC: 10.6 10*3/uL (ref 3.4–10.8)

## 2021-05-02 LAB — LIPID PANEL
Chol/HDL Ratio: 2.2 ratio (ref 0.0–5.0)
Cholesterol, Total: 184 mg/dL (ref 100–199)
HDL: 82 mg/dL (ref >39)
LDL Chol Calc (NIH): 82 mg/dL (ref 0–99)
Triglycerides: 115 mg/dL (ref 0–149)
VLDL Cholesterol Cal: 20 mg/dL (ref 5–40)

## 2021-05-02 LAB — TSH: TSH: 1.01 u[IU]/mL (ref 0.450–4.500)

## 2021-05-02 LAB — LIPOPROTEIN A (LPA): Lipoprotein (a): 57.4 nmol/L (ref <75.0)

## 2021-05-03 DIAGNOSIS — M5441 Lumbago with sciatica, right side: Secondary | ICD-10-CM | POA: Diagnosis not present

## 2021-05-04 ENCOUNTER — Telehealth (HOSPITAL_COMMUNITY): Payer: Self-pay | Admitting: *Deleted

## 2021-05-04 NOTE — Telephone Encounter (Signed)
Attempted to call patient regarding upcoming cardiac CT appointment. °Left message on voicemail with name and callback number ° °Miller Edgington RN Navigator Cardiac Imaging °[facility] °[phone] Office °[phone] Cell ° °

## 2021-05-04 NOTE — Telephone Encounter (Signed)
Reaching out to patient to offer assistance regarding upcoming cardiac imaging study; pt verbalizes understanding of appt date/time, parking situation and where to check in, pre-test NPO status and medications ordered, and verified current allergies; name and call back number provided for further questions should they arise ° °Meliss Fleek RN Navigator Cardiac Imaging °[facility] °[phone] office °[phone] cell  ° °Patient to take 100mg metoprolol tartrate two hours prior to cardiac CT scan. °

## 2021-05-07 DIAGNOSIS — L918 Other hypertrophic disorders of the skin: Secondary | ICD-10-CM | POA: Diagnosis not present

## 2021-05-07 DIAGNOSIS — L570 Actinic keratosis: Secondary | ICD-10-CM | POA: Diagnosis not present

## 2021-05-08 ENCOUNTER — Other Ambulatory Visit: Payer: Self-pay

## 2021-05-08 ENCOUNTER — Other Ambulatory Visit: Payer: Self-pay | Admitting: Cardiovascular Disease

## 2021-05-08 ENCOUNTER — Encounter: Payer: Self-pay | Admitting: Cardiovascular Disease

## 2021-05-08 ENCOUNTER — Encounter (HOSPITAL_COMMUNITY): Payer: Self-pay

## 2021-05-08 ENCOUNTER — Ambulatory Visit (HOSPITAL_COMMUNITY)
Admission: RE | Admit: 2021-05-08 | Discharge: 2021-05-08 | Disposition: A | Discharge status: Home / Self Care | Payer: Medicare Other | Source: Ambulatory Visit | Attending: Cardiovascular Disease | Admitting: Cardiovascular Disease

## 2021-05-08 DIAGNOSIS — R0602 Shortness of breath: Secondary | ICD-10-CM | POA: Insufficient documentation

## 2021-05-08 DIAGNOSIS — I2510 Atherosclerotic heart disease of native coronary artery without angina pectoris: Secondary | ICD-10-CM | POA: Insufficient documentation

## 2021-05-08 DIAGNOSIS — M5441 Lumbago with sciatica, right side: Secondary | ICD-10-CM | POA: Diagnosis not present

## 2021-05-08 DIAGNOSIS — R931 Abnormal findings on diagnostic imaging of heart and coronary circulation: Secondary | ICD-10-CM

## 2021-05-08 MED ORDER — NITROGLYCERIN 0.4 MG SL SUBL
SUBLINGUAL_TABLET | SUBLINGUAL | Status: AC
  Filled 2021-05-08: qty 2

## 2021-05-08 MED ORDER — IOHEXOL 350 MG/ML SOLN
95.0000 mL | Freq: Once | INTRAVENOUS | Status: AC | PRN
  Administered 2021-05-08: 95 mL via INTRAVENOUS

## 2021-05-08 MED ORDER — NITROGLYCERIN 0.4 MG SL SUBL
0.8000 mg | SUBLINGUAL_TABLET | Freq: Once | SUBLINGUAL | Status: AC
  Administered 2021-05-08: 0.8 mg via SUBLINGUAL

## 2021-05-09 ENCOUNTER — Ambulatory Visit (HOSPITAL_COMMUNITY)
Admission: RE | Admit: 2021-05-09 | Discharge: 2021-05-09 | Disposition: A | Discharge status: Home / Self Care | Payer: Medicare Other | Source: Ambulatory Visit | Attending: Cardiovascular Disease | Admitting: Cardiovascular Disease

## 2021-05-09 DIAGNOSIS — R931 Abnormal findings on diagnostic imaging of heart and coronary circulation: Secondary | ICD-10-CM | POA: Diagnosis not present

## 2021-05-09 DIAGNOSIS — I2510 Atherosclerotic heart disease of native coronary artery without angina pectoris: Secondary | ICD-10-CM | POA: Diagnosis not present

## 2021-05-10 DIAGNOSIS — M5441 Lumbago with sciatica, right side: Secondary | ICD-10-CM | POA: Diagnosis not present

## 2021-05-14 DIAGNOSIS — H838X3 Other specified diseases of inner ear, bilateral: Secondary | ICD-10-CM | POA: Diagnosis not present

## 2021-05-14 DIAGNOSIS — H903 Sensorineural hearing loss, bilateral: Secondary | ICD-10-CM | POA: Diagnosis not present

## 2021-05-14 DIAGNOSIS — H9313 Tinnitus, bilateral: Secondary | ICD-10-CM | POA: Diagnosis not present

## 2021-05-14 NOTE — Addendum Note (Signed)
Addended by: Wonda Horner on: 05/14/2021 01:27 PM   Modules accepted: Orders

## 2021-05-15 DIAGNOSIS — M5441 Lumbago with sciatica, right side: Secondary | ICD-10-CM | POA: Diagnosis not present

## 2021-05-16 DIAGNOSIS — F322 Major depressive disorder, single episode, severe without psychotic features: Secondary | ICD-10-CM | POA: Diagnosis not present

## 2021-05-16 DIAGNOSIS — F321 Major depressive disorder, single episode, moderate: Secondary | ICD-10-CM | POA: Diagnosis not present

## 2021-05-17 ENCOUNTER — Telehealth: Payer: Self-pay | Admitting: Cardiovascular Disease

## 2021-05-17 DIAGNOSIS — R079 Chest pain, unspecified: Secondary | ICD-10-CM

## 2021-05-17 DIAGNOSIS — M5441 Lumbago with sciatica, right side: Secondary | ICD-10-CM | POA: Diagnosis not present

## 2021-05-17 NOTE — Telephone Encounter (Addendum)
Dr Percival Spanish to review  ./cy : 1. Right lower lobe pulmonary arterial filling defects are most consistent with pulmonary emboli on this nondedicated exam. 2.  Aortic Atherosclerosis (ICD10-I70.0).

## 2021-05-18 ENCOUNTER — Encounter (HOSPITAL_COMMUNITY): Payer: Self-pay | Admitting: Emergency Medicine

## 2021-05-18 ENCOUNTER — Encounter (HOSPITAL_COMMUNITY): Payer: Self-pay

## 2021-05-18 ENCOUNTER — Observation Stay (HOSPITAL_COMMUNITY)
Admission: EM | Admit: 2021-05-18 | Discharge: 2021-05-20 | Disposition: A | Discharge status: Home / Self Care | Payer: Medicare Other | Attending: Internal Medicine | Admitting: Internal Medicine

## 2021-05-18 ENCOUNTER — Other Ambulatory Visit (HOSPITAL_BASED_OUTPATIENT_CLINIC_OR_DEPARTMENT_OTHER): Payer: Self-pay | Admitting: *Deleted

## 2021-05-18 ENCOUNTER — Telehealth: Payer: Self-pay | Admitting: Cardiology

## 2021-05-18 ENCOUNTER — Telehealth (HOSPITAL_BASED_OUTPATIENT_CLINIC_OR_DEPARTMENT_OTHER): Payer: Self-pay | Admitting: Cardiology

## 2021-05-18 ENCOUNTER — Other Ambulatory Visit: Payer: Self-pay

## 2021-05-18 ENCOUNTER — Ambulatory Visit (HOSPITAL_COMMUNITY)
Admission: RE | Admit: 2021-05-18 | Discharge: 2021-05-18 | Disposition: A | Discharge status: Home / Self Care | Payer: Medicare Other | Source: Ambulatory Visit | Attending: Cardiology | Admitting: Cardiology

## 2021-05-18 DIAGNOSIS — I10 Essential (primary) hypertension: Secondary | ICD-10-CM | POA: Diagnosis not present

## 2021-05-18 DIAGNOSIS — Z87891 Personal history of nicotine dependence: Secondary | ICD-10-CM | POA: Insufficient documentation

## 2021-05-18 DIAGNOSIS — I25118 Atherosclerotic heart disease of native coronary artery with other forms of angina pectoris: Secondary | ICD-10-CM | POA: Diagnosis not present

## 2021-05-18 DIAGNOSIS — R911 Solitary pulmonary nodule: Secondary | ICD-10-CM | POA: Diagnosis not present

## 2021-05-18 DIAGNOSIS — Z20822 Contact with and (suspected) exposure to covid-19: Secondary | ICD-10-CM | POA: Insufficient documentation

## 2021-05-18 DIAGNOSIS — I2510 Atherosclerotic heart disease of native coronary artery without angina pectoris: Secondary | ICD-10-CM | POA: Insufficient documentation

## 2021-05-18 DIAGNOSIS — R0683 Snoring: Secondary | ICD-10-CM | POA: Diagnosis not present

## 2021-05-18 DIAGNOSIS — Z86711 Personal history of pulmonary embolism: Secondary | ICD-10-CM | POA: Diagnosis present

## 2021-05-18 DIAGNOSIS — I2699 Other pulmonary embolism without acute cor pulmonale: Principal | ICD-10-CM | POA: Insufficient documentation

## 2021-05-18 DIAGNOSIS — R079 Chest pain, unspecified: Secondary | ICD-10-CM

## 2021-05-18 DIAGNOSIS — R739 Hyperglycemia, unspecified: Secondary | ICD-10-CM | POA: Diagnosis present

## 2021-05-18 DIAGNOSIS — Z789 Other specified health status: Secondary | ICD-10-CM | POA: Diagnosis present

## 2021-05-18 DIAGNOSIS — E663 Overweight: Secondary | ICD-10-CM | POA: Diagnosis present

## 2021-05-18 DIAGNOSIS — F1090 Alcohol use, unspecified, uncomplicated: Secondary | ICD-10-CM | POA: Diagnosis present

## 2021-05-18 DIAGNOSIS — Z7982 Long term (current) use of aspirin: Secondary | ICD-10-CM | POA: Insufficient documentation

## 2021-05-18 DIAGNOSIS — I2694 Multiple subsegmental pulmonary emboli without acute cor pulmonale: Secondary | ICD-10-CM | POA: Diagnosis not present

## 2021-05-18 DIAGNOSIS — K219 Gastro-esophageal reflux disease without esophagitis: Secondary | ICD-10-CM | POA: Diagnosis present

## 2021-05-18 DIAGNOSIS — Z79899 Other long term (current) drug therapy: Secondary | ICD-10-CM | POA: Diagnosis not present

## 2021-05-18 DIAGNOSIS — R0602 Shortness of breath: Secondary | ICD-10-CM | POA: Diagnosis present

## 2021-05-18 DIAGNOSIS — E785 Hyperlipidemia, unspecified: Secondary | ICD-10-CM | POA: Diagnosis present

## 2021-05-18 LAB — TROPONIN I (HIGH SENSITIVITY)
Troponin I (High Sensitivity): 5 ng/L
Troponin I (High Sensitivity): 5 ng/L (ref <18)

## 2021-05-18 LAB — CBC WITH DIFFERENTIAL/PLATELET
Abs Immature Granulocytes: 0.04 10*3/uL (ref 0.00–0.07)
Basophils Absolute: 0 10*3/uL (ref 0.0–0.1)
Basophils Relative: 1 %
Eosinophils Absolute: 0.2 10*3/uL (ref 0.0–0.5)
Eosinophils Relative: 2 %
HCT: 43.9 % (ref 39.0–52.0)
Hemoglobin: 15.2 g/dL (ref 13.0–17.0)
Immature Granulocytes: 1 %
Lymphocytes Relative: 40 %
Lymphs Abs: 3.5 10*3/uL (ref 0.7–4.0)
MCH: 33.4 pg (ref 26.0–34.0)
MCHC: 34.6 g/dL (ref 30.0–36.0)
MCV: 96.5 fL (ref 80.0–100.0)
Monocytes Absolute: 0.7 10*3/uL (ref 0.1–1.0)
Monocytes Relative: 8 %
Neutro Abs: 4.2 10*3/uL (ref 1.7–7.7)
Neutrophils Relative %: 48 %
Platelets: 167 10*3/uL (ref 150–400)
RBC: 4.55 MIL/uL (ref 4.22–5.81)
RDW: 13.2 % (ref 11.5–15.5)
WBC: 8.7 10*3/uL (ref 4.0–10.5)
nRBC: 0 % (ref 0.0–0.2)

## 2021-05-18 LAB — BASIC METABOLIC PANEL WITH GFR
Anion gap: 9 (ref 5–15)
BUN: 17 mg/dL (ref 8–23)
CO2: 25 mmol/L (ref 22–32)
Calcium: 9 mg/dL (ref 8.9–10.3)
Chloride: 104 mmol/L (ref 98–111)
Creatinine, Ser: 1.04 mg/dL (ref 0.61–1.24)
GFR, Estimated: >60 mL/min
Glucose, Bld: 108 mg/dL — ABNORMAL HIGH (ref 70–99)
Potassium: 4.2 mmol/L (ref 3.5–5.1)
Sodium: 138 mmol/L (ref 135–145)

## 2021-05-18 LAB — PROTIME-INR
INR: 1 (ref 0.8–1.2)
Prothrombin Time: 13 seconds (ref 11.4–15.2)

## 2021-05-18 LAB — RESP PANEL BY RT-PCR (FLU A&B, COVID) ARPGX2
Influenza A by PCR: NEGATIVE
Influenza B by PCR: NEGATIVE
SARS Coronavirus 2 by RT PCR: NEGATIVE

## 2021-05-18 LAB — HEPARIN LEVEL (UNFRACTIONATED): Heparin Unfractionated: 0.66 IU/mL (ref 0.30–0.70)

## 2021-05-18 LAB — APTT: aPTT: 27 seconds (ref 24–36)

## 2021-05-18 MED ORDER — AMLODIPINE BESYLATE 5 MG PO TABS
5.0000 mg | ORAL_TABLET | Freq: Every day | ORAL | Status: DC
  Administered 2021-05-18: 5 mg via ORAL
  Filled 2021-05-18: qty 1

## 2021-05-18 MED ORDER — HEPARIN BOLUS VIA INFUSION
3000.0000 [IU] | Freq: Once | INTRAVENOUS | Status: AC
  Administered 2021-05-18: 3000 [IU] via INTRAVENOUS
  Filled 2021-05-18: qty 3000

## 2021-05-18 MED ORDER — ACETAMINOPHEN 325 MG PO TABS: 650.0000 mg | ORAL_TABLET | Freq: Four times a day (QID) | ORAL | Status: DC | PRN

## 2021-05-18 MED ORDER — ONDANSETRON HCL 4 MG/2ML IJ SOLN: 4.0000 mg | Freq: Four times a day (QID) | INTRAMUSCULAR | Status: DC | PRN

## 2021-05-18 MED ORDER — THIAMINE HCL 100 MG PO TABS
100.0000 mg | ORAL_TABLET | Freq: Every day | ORAL | Status: DC
  Administered 2021-05-18 – 2021-05-20 (×3): 100 mg via ORAL
  Filled 2021-05-18 (×4): qty 1

## 2021-05-18 MED ORDER — AMLODIPINE BESYLATE 5 MG PO TABS
5.0000 mg | ORAL_TABLET | Freq: Every day | ORAL | Status: DC
  Administered 2021-05-19 – 2021-05-20 (×2): 5 mg via ORAL
  Filled 2021-05-18 (×3): qty 1

## 2021-05-18 MED ORDER — GABAPENTIN 300 MG PO CAPS
300.0000 mg | ORAL_CAPSULE | Freq: Three times a day (TID) | ORAL | Status: DC
  Administered 2021-05-18 – 2021-05-20 (×5): 300 mg via ORAL
  Filled 2021-05-18 (×5): qty 1

## 2021-05-18 MED ORDER — LOSARTAN POTASSIUM 50 MG PO TABS
50.0000 mg | ORAL_TABLET | Freq: Every day | ORAL | Status: DC
  Administered 2021-05-19 – 2021-05-20 (×2): 50 mg via ORAL
  Filled 2021-05-18 (×2): qty 1

## 2021-05-18 MED ORDER — ONDANSETRON HCL 4 MG PO TABS: 4.0000 mg | ORAL_TABLET | Freq: Four times a day (QID) | ORAL | Status: DC | PRN

## 2021-05-18 MED ORDER — ADULT MULTIVITAMIN W/MINERALS CH
1.0000 | ORAL_TABLET | Freq: Every day | ORAL | Status: DC
  Administered 2021-05-18 – 2021-05-20 (×3): 1 via ORAL
  Filled 2021-05-18 (×3): qty 1

## 2021-05-18 MED ORDER — THIAMINE HCL 100 MG/ML IJ SOLN
100.0000 mg | Freq: Every day | INTRAMUSCULAR | Status: DC
  Filled 2021-05-18: qty 2

## 2021-05-18 MED ORDER — LORAZEPAM 2 MG/ML IJ SOLN: 1.0000 mg | INTRAMUSCULAR | Status: DC | PRN

## 2021-05-18 MED ORDER — FOLIC ACID 1 MG PO TABS
1.0000 mg | ORAL_TABLET | Freq: Every day | ORAL | Status: DC
  Administered 2021-05-18 – 2021-05-20 (×3): 1 mg via ORAL
  Filled 2021-05-18 (×3): qty 1

## 2021-05-18 MED ORDER — LORAZEPAM 1 MG PO TABS: 0.0000 mg | ORAL_TABLET | Freq: Two times a day (BID) | ORAL | Status: DC

## 2021-05-18 MED ORDER — BUSPIRONE HCL 5 MG PO TABS
5.0000 mg | ORAL_TABLET | Freq: Three times a day (TID) | ORAL | Status: DC | PRN
  Administered 2021-05-18 – 2021-05-20 (×5): 5 mg via ORAL
  Filled 2021-05-18 (×5): qty 1

## 2021-05-18 MED ORDER — METOPROLOL TARTRATE 25 MG PO TABS
12.5000 mg | ORAL_TABLET | Freq: Two times a day (BID) | ORAL | Status: DC
  Administered 2021-05-18 – 2021-05-20 (×5): 12.5 mg via ORAL
  Filled 2021-05-18 (×5): qty 1

## 2021-05-18 MED ORDER — LORAZEPAM 1 MG PO TABS: 1.0000 mg | ORAL_TABLET | ORAL | Status: DC | PRN

## 2021-05-18 MED ORDER — ATORVASTATIN CALCIUM 40 MG PO TABS
80.0000 mg | ORAL_TABLET | Freq: Every day | ORAL | Status: DC
  Administered 2021-05-18 – 2021-05-20 (×3): 80 mg via ORAL
  Filled 2021-05-18 (×4): qty 2

## 2021-05-18 MED ORDER — ACETAMINOPHEN 650 MG RE SUPP: 650.0000 mg | Freq: Four times a day (QID) | RECTAL | Status: DC | PRN

## 2021-05-18 MED ORDER — LOSARTAN POTASSIUM 50 MG PO TABS
50.0000 mg | ORAL_TABLET | Freq: Every day | ORAL | Status: DC
  Administered 2021-05-18: 50 mg via ORAL

## 2021-05-18 MED ORDER — ASPIRIN EC 81 MG PO TBEC
81.0000 mg | DELAYED_RELEASE_TABLET | Freq: Every day | ORAL | Status: DC
  Administered 2021-05-18 – 2021-05-19 (×2): 81 mg via ORAL
  Filled 2021-05-18 (×2): qty 1

## 2021-05-18 MED ORDER — FAMOTIDINE 20 MG PO TABS
20.0000 mg | ORAL_TABLET | Freq: Two times a day (BID) | ORAL | Status: DC
  Administered 2021-05-18 – 2021-05-20 (×4): 20 mg via ORAL
  Filled 2021-05-18 (×4): qty 1

## 2021-05-18 MED ORDER — LORAZEPAM 1 MG PO TABS
0.0000 mg | ORAL_TABLET | Freq: Four times a day (QID) | ORAL | Status: DC
  Administered 2021-05-18 – 2021-05-19 (×2): 1 mg via ORAL
  Filled 2021-05-18 (×2): qty 1

## 2021-05-18 MED ORDER — PANTOPRAZOLE SODIUM 40 MG PO TBEC
40.0000 mg | DELAYED_RELEASE_TABLET | Freq: Every day | ORAL | Status: DC
  Administered 2021-05-19 – 2021-05-20 (×2): 40 mg via ORAL
  Filled 2021-05-18 (×3): qty 1

## 2021-05-18 MED ORDER — IOHEXOL 350 MG/ML SOLN
80.0000 mL | Freq: Once | INTRAVENOUS | Status: AC | PRN
  Administered 2021-05-18: 80 mL via INTRAVENOUS

## 2021-05-18 MED ORDER — DOCUSATE SODIUM 100 MG PO CAPS: 100.0000 mg | ORAL_CAPSULE | Freq: Two times a day (BID) | ORAL | Status: DC | PRN

## 2021-05-18 MED ORDER — HEPARIN (PORCINE) 25000 UT/250ML-% IV SOLN
1550.0000 [IU]/h | INTRAVENOUS | Status: DC
  Administered 2021-05-18 – 2021-05-19 (×2): 1550 [IU]/h via INTRAVENOUS
  Filled 2021-05-18 (×4): qty 250

## 2021-05-18 NOTE — Telephone Encounter (Signed)
DR. Jacalyn Lefevre FROM [facility] WOULD LIKE TO SPEAK WITH DR. HOCHREIN

## 2021-05-18 NOTE — ED Provider Notes (Signed)
[facility] DEPT Provider Note   CSN: [id number] Arrival date & time: 05/18/21  1203     History No chief complaint on file.   William Park is a 70 y.o. male with a past medical history significant for CAD, GERD, hyperlipidemia, hypertension who presents to the ED at cardiology request due to multiple PE on CTA.  Patient endorses shortness of breath that has progressively worsened over numerous months.  Denies associated chest pain.  No previous blood clots.  He is not currently on blood thinners.  Patient had a CTA performed today which showed acute pulmonary embolism and right pulmonary artery branches with possible right heart strain.  Patient was advised to report to the ED for an echocardiogram, IV heparin, and admission.  Patient denies lower extremity edema, recent long immobilizations, and hormonal treatments.  Most recent surgery was on 7/25 where patient had a L4-L5 laminotomy. No history of cancer.  Denies fever and chills.  No treatment prior to arrival.  History obtained from patient and past medical records. No interpreter used during encounter.       Past Medical History:  Diagnosis Date   Allergic rhinitis    allegra and flonase   CAD (coronary artery disease)    Dr. [name] s/p 4 angioplasty's with last in 70. ASA. simvastatin 80mg    GERD (gastroesophageal reflux disease)    nexium 40mg  and zantac BID-mout fills with saliva and coughing. [facility] GI in [facility] 12/2014   Hyperlipidemia    simvastatin 80mg , niaspan 750mg    Hypertension    losartan 50mg    Myocardial infarction ([place])    "small MI, mild"- 1994   Osteoarthritis    mainly hands. Mobic 15mg     Patient Active Problem List   Diagnosis Date Noted   Pulmonary embolism ([place]) 05/18/2021   Spinal stenosis of lumbar region with neurogenic claudication 02/19/2021   Peroneal tendinitis, right 05/18/2018   Major depressive disorder with single episode, in full remission  ([place]) 05/12/2018   Anemia 08/13/2016   Former smoker 01/06/2015   CAD (coronary artery disease)    Hyperlipidemia    Hypertension    GERD (gastroesophageal reflux disease)    Osteoarthritis    Allergic rhinitis     Past Surgical History:  Procedure Laterality Date   ANGIOPLASTY     x4   COLONOSCOPY     EYE SURGERY     LUMBAR LAMINECTOMY/DECOMPRESSION MICRODISCECTOMY Right 02/19/2021   Procedure: L4-L5 LAMINOTOMY/FORAMINOTOMY;  Surgeon: Newman Pies, MD;  Location: [facility];  Service: Neurosurgery;  Laterality: Right;  3C   pyloric stenosis     as a child   TONSILLECTOMY     UPPER GASTROINTESTINAL ENDOSCOPY         Family History  Problem Relation Age of Onset   Dementia Father        passed age 71   Kidney disease Father        CKD   CAD Father    AAA (abdominal aortic aneurysm) Father    Breast cancer Mother    Congestive Heart Failure Mother        79 time of death   CAD Brother    Colon cancer Neg Hx    Esophageal cancer Neg Hx    Stomach cancer Neg Hx    Rectal cancer Neg Hx     Social History   Tobacco Use   Smoking status: Former    Packs/day: 2.00    Years: 25.00  Pack years: 50.00    Types: Cigarettes    Quit date: 07/30/1987    Years since quitting: 33.8   Smokeless tobacco: Never  Vaping Use   Vaping Use: Never used  Substance Use Topics   Alcohol use: Not Currently   Drug use: No    Home Medications Prior to Admission medications   Medication Sig Start Date End Date Taking? Authorizing Provider  amLODipine (NORVASC) 5 MG tablet Take 1 tablet (5 mg total) by mouth daily. 05/01/21 07/30/21  Troy Sine, MD  aspirin 81 MG tablet Take 81 mg by mouth daily.    [provider]  busPIRone (BUSPAR) 5 MG tablet Take 1 tablet (5 mg total) by mouth 3 (three) times daily as needed. 04/09/21   Marin Olp, MD  cyclobenzaprine (FLEXERIL) 10 MG tablet Take 1 tablet (10 mg total) by mouth 3 (three) times daily as needed for muscle spasms  (do not drive for 8 hours after taking). 04/09/21   Marin Olp, MD  docusate sodium (COLACE) 100 MG capsule Take 1 capsule (100 mg total) by mouth 2 (two) times daily. 02/19/21   Viona Gilmore D, NP  famotidine (PEPCID) 20 MG tablet Take 1 tablet (20 mg total) by mouth 2 (two) times daily. 08/28/20   Marin Olp, MD  fluticasone Asencion Islam) 50 MCG/ACT nasal spray Place 2 sprays into both nostrils daily as needed for allergies or rhinitis. 02/19/21   Viona Gilmore D, NP  losartan (COZAAR) 50 MG tablet TAKE ONE TABLET BY MOUTH ONCE DAILY Patient taking differently: 50 mg in the morning and at bedtime. 04/09/21   Marin Olp, MD  meloxicam (MOBIC) 15 MG tablet Take 1 tablet (15 mg total) by mouth daily. Restart 02/24/2021 02/19/21   Viona Gilmore D, NP  metoprolol tartrate (LOPRESSOR) 100 MG tablet Take 1 tablet by mouth once for procedure. 05/01/21   Troy Sine, MD  omeprazole (PRILOSEC) 40 MG capsule Take 1 capsule (40 mg total) by mouth daily. [place]. 02/19/21   Viona Gilmore D, NP  oxyCODONE (OXY IR/ROXICODONE) 5 MG immediate release tablet Take 1 tablet (5 mg total) by mouth every 4 (four) hours as needed for moderate pain ((score 4 to 6)). 02/19/21   Viona Gilmore D, NP  sildenafil (VIAGRA) 100 MG tablet Take 1 tablet (100 mg total) by mouth daily as needed for erectile dysfunction. 04/10/20   Marin Olp, MD  simvastatin (ZOCOR) 80 MG tablet Take 0.5 tablets (40 mg total) by mouth at bedtime. 02/19/21   Viona Gilmore D, NP    Allergies    Patient has no known allergies.  Review of Systems   Review of Systems  Constitutional:  Negative for chills and fever.  Respiratory:  Positive for shortness of breath. Negative for cough.   Cardiovascular:  Negative for chest pain.  Gastrointestinal:  Negative for abdominal pain.  All other systems reviewed and are negative.  Physical Exam Updated Vital Signs BP 131/84 (BP Location: Right Arm)   Pulse  85   Temp 98 F (36.7 C) (Oral)   Resp 20   Ht 5\' 11"  (1.803 m)   Wt 98 kg   SpO2 97%   BMI 30.13 kg/m   Physical Exam Vitals and nursing note reviewed.  Constitutional:      General: He is not in acute distress.    Appearance: He is not ill-appearing.  HENT:     Head: Normocephalic.  Eyes:  Pupils: Pupils are equal, round, and reactive to light.  Cardiovascular:     Rate and Rhythm: Normal rate and regular rhythm.     Pulses: Normal pulses.     Heart sounds: Normal heart sounds. No murmur heard.   No friction rub. No gallop.  Pulmonary:     Effort: Pulmonary effort is normal.     Breath sounds: Normal breath sounds.  Abdominal:     General: Abdomen is flat. There is no distension.     Palpations: Abdomen is soft.     Tenderness: There is no abdominal tenderness. There is no guarding or rebound.  Musculoskeletal:        General: Normal range of motion.     Cervical back: Neck supple.     Comments: No lower extremity edema.  Skin:    General: Skin is warm and dry.  Neurological:     General: No focal deficit present.     Mental Status: He is alert.  Psychiatric:        Mood and Affect: Mood normal.        Behavior: Behavior normal.    ED Results / Procedures / Treatments   Labs (all labs ordered are listed, but only abnormal results are displayed) Labs Reviewed  BASIC METABOLIC PANEL - Abnormal; Notable for the following components:      Result Value   Glucose, Bld 108 (*)    All other components within normal limits  RESP PANEL BY RT-PCR (FLU A&B, COVID) ARPGX2  CBC WITH DIFFERENTIAL/PLATELET  PROTIME-INR  APTT  HEPARIN LEVEL (UNFRACTIONATED)  TROPONIN I (HIGH SENSITIVITY)    EKG None  Radiology CT Angio Chest Pulmonary Embolism (PE) W or WO Contrast  Addendum Date: 05/18/2021   ADDENDUM REPORT: 05/18/2021 11:27 ADDENDUM: Critical Value/emergent results were called by telephone at the time of interpretation on 05/18/2021 at 11:27 am to provider  [facility] , who verbally acknowledged these results. Electronically Signed   By: Zetta Bills M.D.   On: 05/18/2021 11:27   Result Date: 05/18/2021 CLINICAL DATA:  Suspected pulmonary embolism in a 70 year old male with embolism discovered on cardiac evaluation. EXAM: CT ANGIOGRAPHY CHEST WITH CONTRAST TECHNIQUE: Multidetector CT imaging of the chest was performed using the standard protocol during bolus administration of intravenous contrast. Multiplanar CT image reconstructions and MIPs were obtained to evaluate the vascular anatomy. CONTRAST:  22mL OMNIPAQUE IOHEXOL 350 MG/ML SOLN COMPARISON:  Comparison made with May 08, 2021. FINDINGS: Cardiovascular: RIGHT middle and lower lobe PE extending from the RIGHT pulmonary artery into lower lobe and middle lobe branches. Middle lobe emboli appear new compared to the study of May 08, 2021 RV to LV ratio of 0.91. Aorta is of normal caliber. Heart size is normal without substantial pericardial effusion. Calcified coronary artery disease as outlined in previous imaging. Mediastinum/Nodes: No thoracic inlet, axillary, mediastinal or hilar adenopathy. Esophagus grossly normal. Lungs/Pleura: Basilar atelectasis. Small nodule at the RIGHT lung base 9 x 7 mm (image 79/10) more bandlike area of airspace disease in this location on the previous study. No effusion. Airways are patent. Upper Abdomen: Liver displays fissural widening and lobular/mildly nodular contours. No pericholecystic stranding. Imaged portions of pancreas are unremarkable. Spleen is suspected to be normal size but incompletely imaged. No acute gastrointestinal process in the upper abdomen. Musculoskeletal: No acute bone finding. No destructive bone process. Spinal degenerative changes. Review of the MIP images confirms the above findings. IMPRESSION: Positive for acute pulmonary embolism in RIGHT pulmonary arterial branches with  top-normal RV to LV ratio may be an indicator of RIGHT heart  strain. Middle lobe thrombus appears new compared to previous imaging. RV to LV ratio was similar on the previous imaging study. Correlate with any new or worsening symptoms and consider echocardiographic correlation for further assessment. Small nodule at the RIGHT lung base 9 x 7 mm. More bandlike area of airspace disease in this location on the previous study. This may represent a small pulmonary infarct mixed with atelectasis. Consider three-month follow-up to ensure resolution and exclude the possibility of underlying pulmonary nodule. Calcified coronary artery disease. Liver displays fissural widening and lobular/mildly nodular contours. Correlate with any clinical or laboratory evidence of liver disease. Critical Value/emergent results were called by telephone at the time of interpretation on 05/18/2021 at 11:21 am to provider Nurse Altha Harm, who verbally acknowledged these results. Aortic Atherosclerosis (ICD10-I70.0). Electronically Signed: By: Zetta Bills M.D. On: 05/18/2021 11:21    Procedures .Critical Care Performed by: Suzy Bouchard, PA-C Authorized by: Suzy Bouchard, PA-C   Critical care provider statement:    Critical care time (minutes):  35   Critical care time was exclusive of:  Separately billable procedures and treating other patients and teaching time   Critical care was necessary to treat or prevent imminent or life-threatening deterioration of the following conditions:  Cardiac failure and circulatory failure   Critical care was time spent personally by me on the following activities:  Development of treatment plan with patient or surrogate, discussions with consultants, pulse oximetry, ordering and review of laboratory studies, ordering and review of radiographic studies, review of old charts, examination of patient, re-evaluation of patient's condition and obtaining history from patient or surrogate   I assumed direction of critical care for this patient from  another provider in my specialty: no     Care discussed with: admitting provider     Medications Ordered in ED Medications  heparin ADULT infusion 100 units/mL (25000 units/275mL) (1,550 Units/hr Intravenous New Bag/Given 05/18/21 1331)  heparin bolus via infusion 3,000 Units (3,000 Units Intravenous Bolus from Bag 05/18/21 1331)    ED Course  I have reviewed the triage vital signs and the nursing notes.  Pertinent labs & imaging results that were available during my care of the patient were reviewed by me and considered in my medical decision making (see chart for details).    MDM Rules/Calculators/A&P                          70 year old male presents to the ED due to positive PE on CTA.  Patient has been experiencing progressively worsening shortness of breath over numerous months.  No previous blood clots.  Upon arrival, patient afebrile, not tachycardic or hypoxic.  Patient in no acute distress.  Benign physical exam.  CTA performed earlier today personally reviewed which demonstrates right pulmonary embolism with possible right heart strain.  Routine labs ordered.  Troponin and EKG.  IV heparin started.  COVID test for admission.  CBC unremarkable.  CMP significant for hyperglycemia 108.  Normal renal function.  Troponin normal.  PT/INR normal.  COVID test pending.  1:47 PM Discussed with Dr. Olevia Bowens with [place] who agrees to admit patient for further treatment Final Clinical Impression(s) / ED Diagnoses Final diagnoses:  Acute pulmonary embolism, unspecified pulmonary embolism type, unspecified whether acute cor pulmonale present [facility])    Rx / DC Orders ED Discharge Orders     None  Karie Kirks 05/18/21 1349    Luna Fuse, MD 05/18/21 [phone]

## 2021-05-18 NOTE — Telephone Encounter (Addendum)
Dr Percival Spanish spoke with radiologist regarding CT PE protocol  Patient has PE's, see below  IMPRESSION: Positive for acute pulmonary embolism in RIGHT pulmonary arterial branches with top-normal RV to LV ratio may be an indicator of RIGHT heart strain. Middle lobe thrombus appears new compared to previous imaging. RV to LV ratio was similar on the previous imaging study. Correlate with any new or worsening symptoms and consider echocardiographic correlation for further assessment.   Small nodule at the RIGHT lung base 9 x 7 mm. More bandlike area of airspace disease in this location on the previous study. This may represent a small pulmonary infarct mixed with atelectasis. Consider three-month follow-up to ensure resolution and exclude the possibility of underlying pulmonary nodule.  Dr Percival Spanish spoke with patient and advised to go to ED for Echo, heparin, and evaluation

## 2021-05-18 NOTE — H&P (Addendum)
History and Physical    Jaysiah Marchetta Fortuna III JYN:[id number] DOB: 12-03-1950 DOA: 05/18/2021  PCP: Marin Olp, MD   Patient coming from: Home.  I have personally briefly reviewed patient's old medical records in [place]  Chief Complaint: Abnormal chest CT.  HPI: Noe Goyer III is a 70 y.o. male with medical history significant of allergic rhinitis, GERD, hyperlipidemia, hypertension, CAD, history of NSTEMI, osteoarthritis who was referred by cardiology to the emergency department after an outpatient CTA revealed acute PE and right pulmonary arterial branches with top normal RV to LV radio.  There was also small pulmonary nodule in the right base.  The patient denied any recent chest pain, palpitations, dizziness or diaphoresis but has been dyspneic on exertion at times.  No history of unilateral or bilateral lower extremity edema.  Denied fever, chills, sore throat, rhinorrhea, dyspnea, wheezing or hemoptysis.  No abdominal pain, nausea, vomiting, diarrhea, constipation, melena or hematochezia.  No dysuria, frequency or hematuria.  No polyuria, polydipsia, polyphagia or blurred vision.  He has been having recent situational anxiety.  He stated that he drinks 3-4 work drinks of liquor every night.  Dr. Buford Dresser from cardiology communicated to me that there were no plans for caths during this admission and that he can be discharged home on a DOAC.  ED Course: Initial vital signs were temperature 98 F, pulse 85, respiration 20, BP 131/84 mmHg O2 sat 97% on room air.  The patient was started on a heparin infusion in the emergency department.  Lab work: CBC, PT ,INR, PTT, troponin x2 and every resoled buttock glucose of 108 mg/dL on BMP were normal.  Review of Systems: As per HPI otherwise all other systems reviewed and are negative.  Past Medical History:  Diagnosis Date   Allergic rhinitis    allegra and flonase   CAD (coronary artery disease)    Dr.  [name] s/p 4 angioplasty's with last in 70. ASA. simvastatin 80mg    GERD (gastroesophageal reflux disease)    nexium 40mg  and zantac BID-mout fills with saliva and coughing. Seeing GI in [place] 12/2014   Hyperlipidemia    simvastatin 80mg , niaspan 750mg    Hypertension    losartan 50mg    Myocardial infarction ([place])    "small MI, mild"- 1994   Osteoarthritis    mainly hands. Mobic 15mg    Past Surgical History:  Procedure Laterality Date   ANGIOPLASTY     x4   COLONOSCOPY     EYE SURGERY     LUMBAR LAMINECTOMY/DECOMPRESSION MICRODISCECTOMY Right 02/19/2021   Procedure: L4-L5 LAMINOTOMY/FORAMINOTOMY;  Surgeon: Newman Pies, MD;  Location: [facility];  Service: Neurosurgery;  Laterality: Right;  3C   pyloric stenosis     as a child   TONSILLECTOMY     UPPER GASTROINTESTINAL ENDOSCOPY     Social History  reports that he quit smoking about 33 years ago. His smoking use included cigarettes. He has a 50.00 pack-year smoking history. He has never used smokeless tobacco. He reports that he does not currently use alcohol. He reports that he does not use drugs.  No Known Allergies  Family History  Problem Relation Age of Onset   Dementia Father        passed age 41   Kidney disease Father        CKD   CAD Father    AAA (abdominal aortic aneurysm) Father    Breast cancer Mother    Congestive Heart Failure Mother  48 time of death   CAD Brother    Colon cancer Neg Hx    Esophageal cancer Neg Hx    Stomach cancer Neg Hx    Rectal cancer Neg Hx    Prior to Admission medications   Medication Sig Start Date End Date Taking? Authorizing Provider  amLODipine (NORVASC) 5 MG tablet Take 1 tablet (5 mg total) by mouth daily. 05/01/21 07/30/21  Troy Sine, MD  aspirin 81 MG tablet Take 81 mg by mouth daily.    [provider]  busPIRone (BUSPAR) 5 MG tablet Take 1 tablet (5 mg total) by mouth 3 (three) times daily as needed. 04/09/21   Marin Olp, MD  cyclobenzaprine  (FLEXERIL) 10 MG tablet Take 1 tablet (10 mg total) by mouth 3 (three) times daily as needed for muscle spasms (do not drive for 8 hours after taking). 04/09/21   Marin Olp, MD  docusate sodium (COLACE) 100 MG capsule Take 1 capsule (100 mg total) by mouth 2 (two) times daily. 02/19/21   Viona Gilmore D, NP  famotidine (PEPCID) 20 MG tablet Take 1 tablet (20 mg total) by mouth 2 (two) times daily. 08/28/20   Marin Olp, MD  fluticasone Asencion Islam) 50 MCG/ACT nasal spray Place 2 sprays into both nostrils daily as needed for allergies or rhinitis. 02/19/21   Viona Gilmore D, NP  losartan (COZAAR) 50 MG tablet TAKE ONE TABLET BY MOUTH ONCE DAILY Patient taking differently: 50 mg in the morning and at bedtime. 04/09/21   Marin Olp, MD  meloxicam (MOBIC) 15 MG tablet Take 1 tablet (15 mg total) by mouth daily. Restart 02/24/2021 02/19/21   Viona Gilmore D, NP  metoprolol tartrate (LOPRESSOR) 100 MG tablet Take 1 tablet by mouth once for procedure. 05/01/21   Troy Sine, MD  omeprazole (PRILOSEC) 40 MG capsule Take 1 capsule (40 mg total) by mouth daily. [place]. 02/19/21   Viona Gilmore D, NP  oxyCODONE (OXY IR/ROXICODONE) 5 MG immediate release tablet Take 1 tablet (5 mg total) by mouth every 4 (four) hours as needed for moderate pain ((score 4 to 6)). 02/19/21   Viona Gilmore D, NP  sildenafil (VIAGRA) 100 MG tablet Take 1 tablet (100 mg total) by mouth daily as needed for erectile dysfunction. 04/10/20   Marin Olp, MD  simvastatin (ZOCOR) 80 MG tablet Take 0.5 tablets (40 mg total) by mouth at bedtime. 02/19/21   Viona Gilmore D, NP    Physical Exam: Vitals:   05/18/21 1220 05/18/21 1225 05/18/21 1305  BP:   131/84  Pulse:   85  Resp:  (!) 1 20  Temp:  98 F (36.7 C)   TempSrc:  Oral   SpO2:   97%  Weight: 98 kg    Height: 5\' 11"  (1.803 m)      Constitutional: NAD, calm, comfortable Eyes: PERRL, lids and conjunctivae normal ENMT:  Mucous membranes are moist. Posterior pharynx clear of any exudate or lesions. Neck: normal, supple, no masses, no thyromegaly Respiratory: clear to auscultation bilaterally, no wheezing, no crackles. Normal respiratory effort. No accessory muscle use.  Cardiovascular: Regular rate and rhythm, no murmurs / rubs / gallops. No extremity edema. 2+ pedal pulses. No carotid bruits.  Abdomen: Obese, no distention.  Bowel sounds positive.  Soft, no tenderness, no masses palpated. No hepatosplenomegaly. Musculoskeletal: no clubbing / cyanosis.  Good ROM, no contractures. Normal muscle tone.  Skin: no acute rashes, lesions, ulcers on very  limited dermatological semination. Neurologic: CN 2-12 grossly intact. Sensation intact, DTR normal. Strength 5/5 in all 4.  Psychiatric: Normal judgment and insight. Alert and oriented x 3. Normal mood.   Labs on Admission: I have personally reviewed following labs and imaging studies  CBC: Recent Labs  Lab 05/18/21 1230  WBC 8.7  NEUTROABS 4.2  HGB 15.2  HCT 43.9  MCV 96.5  PLT 709    Basic Metabolic Panel: Recent Labs  Lab 05/18/21 1230  NA 138  K 4.2  CL 104  CO2 25  GLUCOSE 108*  BUN 17  CREATININE 1.04  CALCIUM 9.0    GFR: Estimated Creatinine Clearance: 78.9 mL/min (by C-G formula based on SCr of 1.04 mg/dL).  Liver Function Tests: No results for input(s): AST, ALT, ALKPHOS, BILITOT, PROT, ALBUMIN in the last 168 hours.  Urine analysis: No results found for: COLORURINE, APPEARANCEUR, [place], [place], GLUCOSEU, [place], [place], [place], [place], [place], NITRITE, LEUKOCYTESUR  Radiological Exams on Admission: CT Angio Chest Pulmonary Embolism (PE) W or WO Contrast  Addendum Date: 05/18/2021   ADDENDUM REPORT: 05/18/2021 11:27 ADDENDUM: Critical Value/emergent results were called by telephone at the time of interpretation on 05/18/2021 at 11:27 am to provider [facility] , who verbally acknowledged these results.  Electronically Signed   By: Zetta Bills M.D.   On: 05/18/2021 11:27   Result Date: 05/18/2021 CLINICAL DATA:  Suspected pulmonary embolism in a 70 year old male with embolism discovered on cardiac evaluation. EXAM: CT ANGIOGRAPHY CHEST WITH CONTRAST TECHNIQUE: Multidetector CT imaging of the chest was performed using the standard protocol during bolus administration of intravenous contrast. Multiplanar CT image reconstructions and MIPs were obtained to evaluate the vascular anatomy. CONTRAST:  54mL OMNIPAQUE IOHEXOL 350 MG/ML SOLN COMPARISON:  Comparison made with May 08, 2021. FINDINGS: Cardiovascular: RIGHT middle and lower lobe PE extending from the RIGHT pulmonary artery into lower lobe and middle lobe branches. Middle lobe emboli appear new compared to the study of May 08, 2021 RV to LV ratio of 0.91. Aorta is of normal caliber. Heart size is normal without substantial pericardial effusion. Calcified coronary artery disease as outlined in previous imaging. Mediastinum/Nodes: No thoracic inlet, axillary, mediastinal or hilar adenopathy. Esophagus grossly normal. Lungs/Pleura: Basilar atelectasis. Small nodule at the RIGHT lung base 9 x 7 mm (image 79/10) more bandlike area of airspace disease in this location on the previous study. No effusion. Airways are patent. Upper Abdomen: Liver displays fissural widening and lobular/mildly nodular contours. No pericholecystic stranding. Imaged portions of pancreas are unremarkable. Spleen is suspected to be normal size but incompletely imaged. No acute gastrointestinal process in the upper abdomen. Musculoskeletal: No acute bone finding. No destructive bone process. Spinal degenerative changes. Review of the MIP images confirms the above findings. IMPRESSION: Positive for acute pulmonary embolism in RIGHT pulmonary arterial branches with top-normal RV to LV ratio may be an indicator of RIGHT heart strain. Middle lobe thrombus appears new compared to previous  imaging. RV to LV ratio was similar on the previous imaging study. Correlate with any new or worsening symptoms and consider echocardiographic correlation for further assessment. Small nodule at the RIGHT lung base 9 x 7 mm. More bandlike area of airspace disease in this location on the previous study. This may represent a small pulmonary infarct mixed with atelectasis. Consider three-month follow-up to ensure resolution and exclude the possibility of underlying pulmonary nodule. Calcified coronary artery disease. Liver displays fissural widening and lobular/mildly nodular contours. Correlate with any clinical or laboratory evidence of liver  disease. Critical Value/emergent results were called by telephone at the time of interpretation on 05/18/2021 at 11:21 am to provider Nurse Altha Harm, who verbally acknowledged these results. Aortic Atherosclerosis (ICD10-I70.0). Electronically Signed: By: Zetta Bills M.D. On: 05/18/2021 11:21    EKG: Independently reviewed.  Vent. rate 86 BPM PR interval 146 ms QRS duration 95 ms QT/QTcB 375/449 ms P-R-T axes 49 -22 49 Sinus rhythm Borderline left axis deviation  Assessment/Plan Principal Problem:   Pulmonary embolism (HCC) Observation/telemetry. Continue heparin infusion. Supplemental oxygen as needed. Check echocardiogram. Per cardiology can be discharged on DOAC.  Active Problems:   CAD (coronary artery disease) Continue aspirin, amlodipine and simvastatin.    Hyperlipidemia Continue simvastatin 80 mg p.o. daily.    Hypertension Continue amlodipine 5 mg p.o. daily. Continue losartan 50 mg p.o. daily.    GERD (gastroesophageal reflux disease) Hold meloxicam and naproxen. Continue PPI.    Hyperglycemia Nonfasting level.    Pulmonary nodule Follow-up with PCP in 3 months.    History of etoh use No history of alcohol withdrawal. CIWA protocol ordered preemptively.   DVT prophylaxis: On heparin infusion. Code Status:   Full  code. Family Communication:   Disposition Plan:   Patient is from:  Home.  Anticipated DC to:  Home.  Anticipated DC date:  05/19/2021.  Anticipated DC barriers: Pending echocardiogram and clinical status.  Consults called:   Admission status:  Observation/telemetry.   Severity of Illness: High severity in the setting of acute right pulmonary embolism with borderline RV strain.  The patient will remain for treatment with heparin infusion and echocardiogram in the morning.  Reubin Milan MD [facility]  How to contact the [facility] Attending or Consulting provider [place] or covering provider during after hours [place], for this patient?   Check the care team in [facility] and look for a) attending/consulting [facility] provider listed and b) the [facility] team listed Log into [email or web address] and use [place]'s universal password to access. If you do not have the password, please contact the hospital operator. Locate the [facility] provider you are looking for under [facility] and page to a number that you can be directly reached. If you still have difficulty reaching the provider, please page the [facility] (Director on Call) for the Hospitalists listed on amion for assistance.  05/18/2021, 1:53 PM   This document was prepared using Dragon voice recognition software may contain some unintended transcription errors.

## 2021-05-18 NOTE — ED Triage Notes (Signed)
Sent from CT for multiple blood clots. Patient denies SOB, chest pain or any symptoms.

## 2021-05-18 NOTE — Consult Note (Signed)
Cardiology Consultation:   Patient ID: William Park MRN: [id number]; DOB: [birth year]  Admit date: 05/18/2021 Date of Consult: 05/18/2021  PCP:  Marin Olp, MD   [facility] Providers Cardiologist:  Shelva Majestic, MD   {  Patient Profile:   William Park is a 70 y.o. male with a hx of CAD, HTN and HLD who is being seen 05/18/2021 for the evaluation of CAD and PE at the request of Dr. Olevia Bowens.  History of prior CAD and apparently was told of having 3 blockages in 1990 for which he had undergone percutaneous coronary angioplasty procedures by Dr. [name].  His last catheterization was in 1994.  Subsequently, he had lost his insurance.  He has had subsequent follow-up cardiology evaluations by [name] in [facility] and apparently approximately 2 years ago underwent routine treadmill testing and was told that this was apparently "okay."  Over the past 10 months, he has started to notice a change with development of exertional shortness of breath with less activity.  He denies associated chest tightness or pressure.  He has had issues with back discomfort and is status postlaminectomy and also has had some right leg discomfort.  History of Present Illness:   William Park has reestablished care with Dr. [name] 05/01/21 for exertional dyspnea. Recommended coronary CTA and echo. Coronary CT 10/11 showed moderate CAD in D1, LAD and circumflex. Positive FFR CT in mid to distal LAD 0.77. Unfortunately his radiology part did not read until yesterday as "right lower lobe pulmonary arterial filling defects are most consistent with pulmonary emboli on this nondedicated exam". He [facility] to [facility] for  CT angio of chest acute pulmonary embolism in RIGHT pulmonary arterial branches with top-normal RV to LV ratio may be an indicator of RIGHT heart strain. Middle lobe thrombus appears new compared to previous imaging. RV to LV ratio was similar on the previous  imaging study. Due to this finding he was directed to ER.   The patient reports continuous dyspnea on exertion without chest tightness.  Currently saturating well.  No palpitation, lower extremity edema, syncope or melena.  He reports intermittent orthopnea and PND. Troponin negative. No family hx of blood clot.   Coronary CTA IMPRESSION: 1. Severe 3 vessel calcium score 1198 which is 58 th percentile for age and sex   2.  Normal ascending aortic root diameter 3.2 cm   3. Moderate CAD in D1, LAD and circumflex see description above Study sent for FFR CT.  FINDINGS: FFR CT normal in RCA and LCX and all there branches   Positive in mid to distal LAD   LAD: Proximal 0.96, mid 93 distal 0.77   IMPRESSION: Positive FFR CT in mid to distal LAD 0.77    CT angio of chest: IMPRESSION: Positive for acute pulmonary embolism in RIGHT pulmonary arterial branches with top-normal RV to LV ratio may be an indicator of RIGHT heart strain. Middle lobe thrombus appears new compared to previous imaging. RV to LV ratio was similar on the previous imaging study. Correlate with any new or worsening symptoms and consider echocardiographic correlation for further assessment.   Small nodule at the RIGHT lung base 9 x 7 mm. More bandlike area of airspace disease in this location on the previous study. This may represent a small pulmonary infarct mixed with atelectasis. Consider three-month follow-up to ensure resolution and exclude the possibility of underlying pulmonary nodule.   Calcified coronary artery disease.   Liver displays  fissural widening and lobular/mildly nodular contours. Correlate with any clinical or laboratory evidence of liver disease.   Critical Value/emergent results were called by telephone at the time of interpretation on 05/18/2021 at 11:21 am to provider Nurse Altha Harm, who verbally acknowledged these results.   Aortic Atherosclerosis (ICD10-I70.0).   Past Medical  History:  Diagnosis Date   Allergic rhinitis    allegra and flonase   CAD (coronary artery disease)    Dr. [name] s/p 4 angioplasty's with last in 46. ASA. simvastatin 80mg    GERD (gastroesophageal reflux disease)    nexium 40mg  and zantac BID-mout fills with saliva and coughing. Seeing GI in [facility] 12/2014   Hyperlipidemia    simvastatin 80mg , niaspan 750mg    Hypertension    losartan 50mg    Myocardial infarction ([place])    "small MI, mild"- 1994   Osteoarthritis    mainly hands. Mobic 15mg     Past Surgical History:  Procedure Laterality Date   ANGIOPLASTY     x4   COLONOSCOPY     EYE SURGERY     LUMBAR LAMINECTOMY/DECOMPRESSION MICRODISCECTOMY Right 02/19/2021   Procedure: L4-L5 LAMINOTOMY/FORAMINOTOMY;  Surgeon: Newman Pies, MD;  Location: [facility];  Service: Neurosurgery;  Laterality: Right;  3C   pyloric stenosis     as a child   TONSILLECTOMY     UPPER GASTROINTESTINAL ENDOSCOPY       Inpatient Medications: Scheduled Meds:  Continuous Infusions:  heparin 1,550 Units/hr (05/18/21 1331)   PRN Meds: acetaminophen **OR** acetaminophen, ondansetron **OR** ondansetron (ZOFRAN) IV  Allergies:   No Known Allergies  Social History:   Social History   Socioeconomic History   Marital status: Divorced    Spouse name: Not on file   Number of children: Not on file   Years of education: Not on file   Highest education level: Not on file  Occupational History   Not on file  Tobacco Use   Smoking status: Former    Packs/day: 2.00    Years: 25.00    Pack years: 50.00    Types: Cigarettes    Quit date: 07/30/1987    Years since quitting: 33.8   Smokeless tobacco: Never  Vaping Use   Vaping Use: Never used  Substance and Sexual Activity   Alcohol use: Not Currently   Drug use: No   Sexual activity: Not on file  Other Topics Concern   Not on file  Social History Narrative   Family: Divorced. 2 daughters. 3 granddaughters. Doesn't get to see them.       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   Social Determinants of Radio broadcast assistant Strain: Not on file  Food Insecurity: No Food Insecurity   Worried About Charity fundraiser in the Last Year: Never true   Arboriculturist in the Last Year: Never true  Transportation Needs: No Transportation Needs   Lack of Transportation (Medical): No   Lack of Transportation (Non-Medical): No  Physical Activity: Not on file  Stress: Not on file  Social Connections: Not on file  Intimate Partner Violence: Not on file    Family History:   Family History  Problem Relation Age of Onset   Dementia Father        passed age 34   Kidney disease Father        CKD   CAD Father    AAA (abdominal aortic aneurysm)  Father    Breast cancer Mother    Congestive Heart Failure Mother        48 time of death   CAD Brother    Colon cancer Neg Hx    Esophageal cancer Neg Hx    Stomach cancer Neg Hx    Rectal cancer Neg Hx      ROS:  Please see the history of present illness.  All other ROS reviewed and negative.     Physical Exam/Data:   Vitals:   05/18/21 1220 05/18/21 1225 05/18/21 1305  BP:   131/84  Pulse:   85  Resp:  (!) 1 20  Temp:  98 F (36.7 C)   TempSrc:  Oral   SpO2:   97%  Weight: 98 kg    Height: 5\' 11"  (1.803 m)     No intake or output data in the 24 hours ending 05/18/21 1418 Last 3 Weights 05/18/2021 05/01/2021 04/09/2021  Weight (lbs) 216 lb 0.8 oz 215 lb 12.8 oz 218 lb 6.4 oz  Weight (kg) 98 kg 97.886 kg 99.066 kg     Body mass index is 30.13 kg/m.  General:  Well nourished, well developed, in no acute distress HEENT: normal Neck: no JVD Vascular: No carotid bruits; Distal pulses 2+ bilaterally Cardiac:  normal S1, S2; RRR; no murmur  Lungs:  clear to auscultation bilaterally, no wheezing, rhonchi or rales  Abd: soft, nontender, no hepatomegaly  Ext: no edema Musculoskeletal:  No deformities, BUE and BLE strength  normal and equal Skin: warm and dry  Neuro:  CNs 2-12 intact, no focal abnormalities noted Psych:  Normal affect   EKG:  The EKG was personally reviewed and demonstrates:  SR, HR 86 bpm Telemetry:  Telemetry was personally reviewed and demonstrates:  NSR  Relevant CV Studies: As above  Laboratory Data:  High Sensitivity Troponin:   Recent Labs  Lab 05/18/21 1230  TROPONINIHS 5     Chemistry Recent Labs  Lab 05/18/21 1230  NA 138  K 4.2  CL 104  CO2 25  GLUCOSE 108*  BUN 17  CREATININE 1.04  CALCIUM 9.0  GFRNONAA >60  ANIONGAP 9    Hematology Recent Labs  Lab 05/18/21 1230  WBC 8.7  RBC 4.55  HGB 15.2  HCT 43.9  MCV 96.5  MCH 33.4  MCHC 34.6  RDW 13.2  PLT 167   Radiology/Studies:  CT Angio Chest Pulmonary Embolism (PE) W or WO Contrast  Addendum Date: 05/18/2021   ADDENDUM REPORT: 05/18/2021 11:27 ADDENDUM: Critical Value/emergent results were called by telephone at the time of interpretation on 05/18/2021 at 11:27 am to provider [facility] , who verbally acknowledged these results. Electronically Signed   By: Zetta Bills M.D.   On: 05/18/2021 11:27   Result Date: 05/18/2021 CLINICAL DATA:  Suspected pulmonary embolism in a 70 year old male with embolism discovered on cardiac evaluation. EXAM: CT ANGIOGRAPHY CHEST WITH CONTRAST TECHNIQUE: Multidetector CT imaging of the chest was performed using the standard protocol during bolus administration of intravenous contrast. Multiplanar CT image reconstructions and MIPs were obtained to evaluate the vascular anatomy. CONTRAST:  15mL OMNIPAQUE IOHEXOL 350 MG/ML SOLN COMPARISON:  Comparison made with May 08, 2021. FINDINGS: Cardiovascular: RIGHT middle and lower lobe PE extending from the RIGHT pulmonary artery into lower lobe and middle lobe branches. Middle lobe emboli appear new compared to the study of May 08, 2021 RV to LV ratio of 0.91. Aorta is of normal caliber. Heart size is normal without  substantial pericardial effusion. Calcified coronary artery disease as outlined in previous imaging. Mediastinum/Nodes: No thoracic inlet, axillary, mediastinal or hilar adenopathy. Esophagus grossly normal. Lungs/Pleura: Basilar atelectasis. Small nodule at the RIGHT lung base 9 x 7 mm (image 79/10) more bandlike area of airspace disease in this location on the previous study. No effusion. Airways are patent. Upper Abdomen: Liver displays fissural widening and lobular/mildly nodular contours. No pericholecystic stranding. Imaged portions of pancreas are unremarkable. Spleen is suspected to be normal size but incompletely imaged. No acute gastrointestinal process in the upper abdomen. Musculoskeletal: No acute bone finding. No destructive bone process. Spinal degenerative changes. Review of the MIP images confirms the above findings. IMPRESSION: Positive for acute pulmonary embolism in RIGHT pulmonary arterial branches with top-normal RV to LV ratio may be an indicator of RIGHT heart strain. Middle lobe thrombus appears new compared to previous imaging. RV to LV ratio was similar on the previous imaging study. Correlate with any new or worsening symptoms and consider echocardiographic correlation for further assessment. Small nodule at the RIGHT lung base 9 x 7 mm. More bandlike area of airspace disease in this location on the previous study. This may represent a small pulmonary infarct mixed with atelectasis. Consider three-month follow-up to ensure resolution and exclude the possibility of underlying pulmonary nodule. Calcified coronary artery disease. Liver displays fissural widening and lobular/mildly nodular contours. Correlate with any clinical or laboratory evidence of liver disease. Critical Value/emergent results were called by telephone at the time of interpretation on 05/18/2021 at 11:21 am to provider Nurse Altha Harm, who verbally acknowledged these results. Aortic Atherosclerosis (ICD10-I70.0).  Electronically Signed: By: Zetta Bills M.D. On: 05/18/2021 11:21    Assessment and Plan:   CAD - History of angioplasty remotely in 1990s.  Coronary CTA showed Moderate stenosis CAD in D1, LAD and circumflex>> pending FFR.  - Continue heparin for PE - Continue ASA 81mg  qd - Add Lipitor 80mg  qd (was on simvastatin at home) - Start Lopressor 12.5mg  BID - Cath likely in outpatient setting   2. Pulmonary embolism - CTA of chest with Positive for acute pulmonary embolism in RIGHT pulmonary arterial branches with top-normal RV to LV ratio may be an indicator of RIGHT heart strain. Middle lobe thrombus appears new compared to previous imaging. RV to LV ratio was similar on the previous imaging study. - Get echocardiogram - On heparin for anticoagulation >> change to DOAC - Get LE venous doppler to r/o DVT  3. HTN - BP stable - Add BB as above - Hold home Amlodipine and Losartan  - Titrate BB as BP and HR allows  4. HLD - 05/01/2021: Cholesterol, Total 184; HDL 82; LDL Chol Calc (NIH) 82; Triglycerides 115  - Change simvastatin to Lipitor as above   For questions or updates, please contact [place] Please consult [email or web address] for contact info under    Jarrett Soho, PA  05/18/2021 2:18 PM

## 2021-05-18 NOTE — Telephone Encounter (Signed)
Spoke with patient regarding 10:00 am CTA chest PE protocol appt at [facility] this morning ---arrival time is 9:45 am 1st floor radiology for check in.  Patient informed not to have any solid food prior to his study.  Patient voiced his understanding.

## 2021-05-18 NOTE — Progress Notes (Signed)
ANTICOAGULATION CONSULT NOTE - Initial Consult  Pharmacy Consult for Heparin Indication: pulmonary embolus  No Known Allergies  Patient Measurements: Height: 5\' 11"  (180.3 cm) Weight: 98 kg (216 lb 0.8 oz) IBW/kg (Calculated) : 75.3 Heparin Dosing Weight: 95 kg  Vital Signs: Temp: 98 F (36.7 C) (10/21 1225) Temp Source: Oral (10/21 1225)  Labs: Recent Labs    05/18/21 1230  HGB 15.2  HCT 43.9  PLT 167    Estimated Creatinine Clearance: 72 mL/min (by C-G formula based on SCr of 1.14 mg/dL).   Medical History: Past Medical History:  Diagnosis Date   Allergic rhinitis    allegra and flonase   CAD (coronary artery disease)    Dr. [name] s/p 4 angioplasty's with last in 40. ASA. simvastatin 80mg    GERD (gastroesophageal reflux disease)    nexium 40mg  and zantac BID-mout fills with saliva and coughing. Seeing GI in [place] 12/2014   Hyperlipidemia    simvastatin 80mg , niaspan 750mg    Hypertension    losartan 50mg    Myocardial infarction ([place])    "small MI, mild"- 1994   Osteoarthritis    mainly hands. Mobic 15mg     Medications:  Infusions:   Assessment: 37 yoM sent to ED with CT results, new PE.  Pharmacy is consulted to dose Heparin.  No prior to admission anticoagulation. Baseline coags pending CBC:  Hgb 15.2, Plt 167  Goal of Therapy:  Heparin level 0.3-0.7 units/ml Monitor platelets by anticoagulation protocol: Yes   Plan:  Baseline PTT, PT/INR Give heparin 3000 units bolus IV x 1 Start heparin IV infusion at 1550 units/hr Heparin level 8 hours after starting Daily heparin level and CBC    Gretta Arab PharmD, BCPS Clinical Pharmacist [facility] main pharmacy [phone] 05/18/2021 12:59 PM

## 2021-05-18 NOTE — Telephone Encounter (Signed)
See 10/21 phone note regarding CT PE protocol

## 2021-05-19 ENCOUNTER — Observation Stay (HOSPITAL_BASED_OUTPATIENT_CLINIC_OR_DEPARTMENT_OTHER): Payer: Medicare Other

## 2021-05-19 DIAGNOSIS — E663 Overweight: Secondary | ICD-10-CM | POA: Diagnosis present

## 2021-05-19 DIAGNOSIS — I2609 Other pulmonary embolism with acute cor pulmonale: Secondary | ICD-10-CM

## 2021-05-19 DIAGNOSIS — I2699 Other pulmonary embolism without acute cor pulmonale: Secondary | ICD-10-CM

## 2021-05-19 LAB — CBC
HCT: 40.4 % (ref 39.0–52.0)
Hemoglobin: 13.6 g/dL (ref 13.0–17.0)
MCH: 33.1 pg (ref 26.0–34.0)
MCHC: 33.7 g/dL (ref 30.0–36.0)
MCV: 98.3 fL (ref 80.0–100.0)
Platelets: 140 10*3/uL — ABNORMAL LOW (ref 150–400)
RBC: 4.11 MIL/uL — ABNORMAL LOW (ref 4.22–5.81)
RDW: 13.3 % (ref 11.5–15.5)
WBC: 7.2 10*3/uL (ref 4.0–10.5)
nRBC: 0 % (ref 0.0–0.2)

## 2021-05-19 LAB — ECHOCARDIOGRAM COMPLETE
AR max vel: 2.88 cm2
AV Area VTI: 2.92 cm2
AV Area mean vel: 2.72 cm2
AV Mean grad: 3 mmHg
AV Peak grad: 5.8 mmHg
Ao pk vel: 1.2 m/s
Area-P 1/2: 3.99 cm2
Height: 71 in
S' Lateral: 3.3 cm
Weight: 3082.91 oz

## 2021-05-19 LAB — HIV ANTIBODY (ROUTINE TESTING W REFLEX): HIV Screen 4th Generation wRfx: NONREACTIVE

## 2021-05-19 LAB — HEPARIN LEVEL (UNFRACTIONATED): Heparin Unfractionated: 1.07 IU/mL — ABNORMAL HIGH (ref 0.30–0.70)

## 2021-05-19 MED ORDER — APIXABAN 5 MG PO TABS: 5.0000 mg | ORAL_TABLET | Freq: Two times a day (BID) | ORAL | Status: DC

## 2021-05-19 MED ORDER — APIXABAN 5 MG PO TABS
10.0000 mg | ORAL_TABLET | Freq: Two times a day (BID) | ORAL | Status: DC
  Administered 2021-05-19 – 2021-05-20 (×3): 10 mg via ORAL
  Filled 2021-05-19 (×3): qty 2

## 2021-05-19 MED ORDER — METOPROLOL TARTRATE 25 MG PO TABS: 12.5000 mg | ORAL_TABLET | Freq: Two times a day (BID) | ORAL | 1 refills | Status: DC

## 2021-05-19 MED ORDER — HEPARIN (PORCINE) 25000 UT/250ML-% IV SOLN
1000.0000 [IU]/h | INTRAVENOUS | Status: AC
  Filled 2021-05-19: qty 250

## 2021-05-19 MED ORDER — ATORVASTATIN CALCIUM 80 MG PO TABS: 80.0000 mg | ORAL_TABLET | Freq: Every day | ORAL | 1 refills | Status: DC

## 2021-05-19 NOTE — Progress Notes (Signed)
ANTICOAGULATION CONSULT NOTE -  Follow up  Pharmacy Consult for Heparin Indication: pulmonary embolus  No Known Allergies  Patient Measurements: Height: 5\' 11"  (180.3 cm) Weight: 87.4 kg (192 lb 10.9 oz) IBW/kg (Calculated) : 75.3 Heparin Dosing Weight: 87 kg  Vital Signs: Temp: 97.8 F (36.6 C) (10/22 0430) Temp Source: Oral (10/22 0430) BP: 113/67 (10/22 0430) Pulse Rate: 59 (10/22 0430)  Labs: Recent Labs    05/18/21 1230 05/18/21 1600 05/18/21 2102 05/19/21 0530  HGB 15.2  --   --  13.6  HCT 43.9  --   --  40.4  PLT 167  --   --  140*  APTT 27  --   --   --   LABPROT 13.0  --   --   --   INR 1.0  --   --   --   HEPARINUNFRC  --   --  0.66 1.07*  CREATININE 1.04  --   --   --   TROPONINIHS 5 5  --   --      Estimated Creatinine Clearance: 70.4 mL/min (by C-G formula based on SCr of 1.04 mg/dL).   Medical History: Past Medical History:  Diagnosis Date   Allergic rhinitis    allegra and flonase   CAD (coronary artery disease)    Dr. [name] s/p 4 angioplasty's with last in 54. ASA. simvastatin 80mg    GERD (gastroesophageal reflux disease)    nexium 40mg  and zantac BID-mout fills with saliva and coughing. Seeing GI in [place] 12/2014   Hyperlipidemia    simvastatin 80mg , niaspan 750mg    Hypertension    losartan 50mg    Myocardial infarction ([place])    "small MI, mild"- 1994   Osteoarthritis    mainly hands. Mobic 15mg     Medications:  Infusions:   Assessment: 26 yoM sent to ED with CT results, new PE.  Pharmacy is consulted to dose Heparin.  No prior to admission anticoagulation. Baseline coags WNL CBC:  Hgb 15.2, Plt 167  Heparin level now SUPRAtherapeutic on current IV heparin rate of 1550 units/hr Hgb 13.6 down, Plt 140 down - monitor Per RN, no issues, confirmed heparin going at 15.5 ml/hr, no bleeding noted  Goal of Therapy:  Heparin level 0.3-0.7 units/ml Monitor platelets by anticoagulation protocol: Yes   Plan:  Reduce IV heparin from  1550 units/hr to 1000 units/hr Recheck heparin level 8 hours after rate change Daily heparin level and CBC   Adrian Saran, PharmD, BCPS Secure Chat if ?s 05/19/2021 7:16 AM

## 2021-05-19 NOTE — Discharge Instructions (Addendum)
Information on my medicine - ELIQUIS (apixaban)  This medication education was reviewed with me or my healthcare representative as part of my discharge preparation.  The pharmacist that spoke with me during my hospital stay was:  Minda Ditto, [facility]  Why was Eliquis prescribed for you? Eliquis was prescribed to treat blood clots that may have been found in the veins of your legs (deep vein thrombosis) or in your lungs (pulmonary embolism) and to reduce the risk of them occurring again.  What do You need to know about Eliquis ? The starting dose is 10 mg (two 5 mg tablets) taken TWICE daily for the FIRST SEVEN (7) DAYS, then on (enter date)  05/26/2021  the dose is reduced to ONE 5 mg tablet taken TWICE daily.  Eliquis may be taken with or without food.   Try to take the dose about the same time in the morning and in the evening. If you have difficulty swallowing the tablet whole please discuss with your pharmacist how to take the medication safely.  Take Eliquis exactly as prescribed and DO NOT stop taking Eliquis without talking to the doctor who prescribed the medication.  Stopping may increase your risk of developing a new blood clot.  Refill your prescription before you run out.  After discharge, you should have regular check-up appointments with your healthcare provider that is prescribing your Eliquis.    What do you do if you miss a dose? If a dose of ELIQUIS is not taken at the scheduled time, take it as soon as possible on the same day and twice-daily administration should be resumed. The dose should not be doubled to make up for a missed dose.  Important Safety Information A possible side effect of Eliquis is bleeding. You should call your healthcare provider right away if you experience any of the following: Bleeding from an injury or your nose that does not stop. Unusual colored urine (red or dark brown) or unusual colored stools (red or black). Unusual bruising for  unknown reasons. A serious fall or if you hit your head (even if there is no bleeding).  Some medicines may interact with Eliquis and might increase your risk of bleeding or clotting while on Eliquis. To help avoid this, consult your healthcare provider or pharmacist prior to using any new prescription or non-prescription medications, including herbals, vitamins, non-steroidal anti-inflammatory drugs (NSAIDs) and supplements.  This website has more information on Eliquis (apixaban): http://www.eliquis.com/eliquis/home  Substance Abuse Resources  Crisis Mobile: Therapeutic Alternatives: [phone] (for crisis response 24 hours a day) [facility] [phone]  Outpatient Substance Use Treatment [place] [facility]  Chemical Dependence Intensive Outpatient Program [address] [place], [place] [address]  [phone] Private insurance, Medicare A&B, and [facility]   ADS (Alcohol and Drug Services)  [address]  [place], [place] [address] [phone] Medicaid, [place] [address] [place]  [place], [place] Medicaid and [facility], Self Pay   [facility] [address]  [place], [facility], and Self Pay  Fellowship [place]    [place], [place] [address]  [phone] or [phone] Private Insurance Only   [facility] [place] [facility] [address]  [place], [place] [phone] Medicaid, Medicare, [place] at the [facility] [address]  [address], [place], [place] [address] [phone] Services are free or reduced  [facility]  [address] [phone]  Self Pay only, sliding scale  Caring Services  [address]  [place], [place] [address] [phone] ([facility]) Self Pay,  Medicaid Only   [facility] [place], [place] [address] [phone] Medicaid, Medicare, [place]  Residential Substance Use Treatment Services   [facility]  [place], [place] [address]  [phone] or [phone] Detox (Medicare, Medicaid, private insurance, and self pay)  Residential Rehab 14 days (Medicare, [place], private insurance, and self pay)   RTS (Residential Treatment Services)  [place], [place]  Male and Male Detox (Self Pay and Medicaid limited availability)  Rehab only Male ([place] and self pay only)   Fellowship [address]      [address]  [place], [place] [address]  [phone] or [phone] Detox and [place] Only   [place]  [place]  [place], [place] [address]  [phone]  Treatment Only, must make assessment appointment, and must be sober for assessment appointment.  Self Pay Only, Medicare A&B, [facility], [place] ID only! *Transportation assistance offered from [place] on [place], [place] [address] Walk in interviews M-Sat 8-4p No pending legal charges [phone]  ADATC:  [facility] Referral  [address] [place], [place] (Self Pay, [facility])  [facility] [address] [place], [place] [address] [phone] Detox and Residential Treatment Medicare and [place].  [place], [place] [address] [place]: [place]: [phone] Long-term Residential Program:  [phone] Males 25 and Over (No Insurance, upfront fee)  [place], [place] [address] [phone] Private Insurance with [place], [place], [place] [address] Local  ([place].  [place], [place] [address]  [phone] (Males, upfront fee)  [place] of [place]  [place], [place] Locations  [facility]  [address]  [place], [place] Based Program for individuals experiencing homelessness Self Pay, No insurance  [place]  Men's program: [facility] [address] [place], [place] [address] [phone]  [place] Women's program: [facility] [address] [place], [place] [address] [phone] Christian Based Program for individuals experiencing homelessness Self Pay, No insurance  [facility] Men's Division [address] [place], [place] [address]  [place] for individuals experiencing homelessness Self Pay, No insurance  [facility] Women's Division [place],  [address] [place] for individuals experiencing homelessness Self Pay, No insurance  [facility] [place], [place] for males experiencing homelessness Self Pay, No insurance

## 2021-05-19 NOTE — Progress Notes (Signed)
  Echocardiogram 2D Echocardiogram has been performed.  Merrie Roof F 05/19/2021, 9:20 AM

## 2021-05-19 NOTE — Progress Notes (Signed)
ANTICOAGULATION CONSULT NOTE -  Follow up  Pharmacy Consult for Heparin Indication: pulmonary embolus  No Known Allergies  Patient Measurements: Height: 5\' 11"  (180.3 cm) Weight: 87.4 kg (192 lb 10.9 oz) IBW/kg (Calculated) : 75.3 Heparin Dosing Weight: 95 kg  Vital Signs: Temp: 98.5 F (36.9 C) (10/21 2210) Temp Source: Oral (10/21 2210) BP: 117/81 (10/21 2210) Pulse Rate: 65 (10/21 2210)  Labs: Recent Labs    05/18/21 1230 05/18/21 1600 05/18/21 2102  HGB 15.2  --   --   HCT 43.9  --   --   PLT 167  --   --   APTT 27  --   --   LABPROT 13.0  --   --   INR 1.0  --   --   HEPARINUNFRC  --   --  0.66  CREATININE 1.04  --   --   TROPONINIHS 5 5  --      Estimated Creatinine Clearance: 70.4 mL/min (by C-G formula based on SCr of 1.04 mg/dL).   Medical History: Past Medical History:  Diagnosis Date   Allergic rhinitis    allegra and flonase   CAD (coronary artery disease)    Dr. [name] s/p 4 angioplasty's with last in 94. ASA. simvastatin 80mg    GERD (gastroesophageal reflux disease)    nexium 40mg  and zantac BID-mout fills with saliva and coughing. Seeing GI in [place] 12/2014   Hyperlipidemia    simvastatin 80mg , niaspan 750mg    Hypertension    losartan 50mg    Myocardial infarction ([place])    "small MI, mild"- 1994   Osteoarthritis    mainly hands. Mobic 15mg     Medications:  Infusions:   Assessment: 66 yoM sent to ED with CT results, new PE.  Pharmacy is consulted to dose Heparin.  No prior to admission anticoagulation. Baseline coags WNL CBC:  Hgb 15.2, Plt 167  HL 0.66 therapeutic on 1550 units/hr No bleeding noted  Goal of Therapy:  Heparin level 0.3-0.7 units/ml Monitor platelets by anticoagulation protocol: Yes   Plan:  continue heparin IV infusion at 1550 units/hr Confirmatory Heparin level 8 hours Daily heparin level and CBC    Dolly Rias RPh 05/19/2021, 1:36 AM

## 2021-05-19 NOTE — Progress Notes (Signed)
ANTICOAGULATION CONSULT NOTE -    Pharmacy Consult for Apixaban Indication: pulmonary embolus  No Known Allergies  Patient Measurements: Height: 5\' 11"  (180.3 cm) Weight: 87.4 kg (192 lb 10.9 oz) IBW/kg (Calculated) : 75.3 Heparin Dosing Weight: 87 kg  Vital Signs: Temp: 98.4 F (36.9 C) (10/22 0820) Temp Source: Oral (10/22 0820) BP: 134/80 (10/22 0820) Pulse Rate: 54 (10/22 0820)  Labs: Recent Labs    05/18/21 1230 05/18/21 1600 05/18/21 2102 05/19/21 0530  HGB 15.2  --   --  13.6  HCT 43.9  --   --  40.4  PLT 167  --   --  140*  APTT 27  --   --   --   LABPROT 13.0  --   --   --   INR 1.0  --   --   --   HEPARINUNFRC  --   --  0.66 1.07*  CREATININE 1.04  --   --   --   TROPONINIHS 5 5  --   --      Estimated Creatinine Clearance: 70.4 mL/min (by C-G formula based on SCr of 1.04 mg/dL).   Medical History: Past Medical History:  Diagnosis Date   Allergic rhinitis    allegra and flonase   CAD (coronary artery disease)    Dr. [name] s/p 4 angioplasty's with last in 80. ASA. simvastatin 80mg    GERD (gastroesophageal reflux disease)    nexium 40mg  and zantac BID-mout fills with saliva and coughing. Seeing GI in [place] 12/2014   Hyperlipidemia    simvastatin 80mg , niaspan 750mg    Hypertension    losartan 50mg    Myocardial infarction ([place])    "small MI, mild"- 1994   Osteoarthritis    mainly hands. Mobic 15mg     Medications:  Infusions:   Assessment: 69 yoM sent to ED with CT results, new PE.  Pharmacy is consulted to dose Heparin.  No prior to admission anticoagulation. Baseline coags WNL CBC:  Hgb 15.2, Plt 167  10/22 AM Heparin level now SUPRAtherapeutic on current IV heparin rate of 1550 units/hr Hgb 13.6 down, Plt 140 down - monitor Per RN, no issues, confirmed heparin going at 15.5 ml/hr, no bleeding noted Conver to DOAC, use Apixaban  Goal of Therapy:  Heparin level 0.3-0.7 units/ml Monitor platelets by anticoagulation protocol: Yes    Plan:  Discontinue Heparin, start Apixaban 72mb bid x 14 days, followed by 5mg  bid Give first Apixaban dose when stopping Heparin Monitor CBC, s/s bleed Provide counseling, medication assistance card  Minda Ditto PharmD [facility] [phone] 05/19/2021, 11:41 AM

## 2021-05-19 NOTE — Progress Notes (Signed)
    Dr Christopher's consult note reviewed. No additional recs at this time. Being managed for PE found initially on coronary CT scan and later confirmed with CT PE.Outpatient coronary CT showed abnormal FFR in mid to distal LAD. No evidence of ACS this admission, no indication for inpatient cath particularly with acutely diagnosed PE. His outpatient symptosm were DOE, may be related solely to PE, monitor symptoms over time and consider if cath is indicated further out from PE as outpatient. He is on ASA, atorvastatin, metoprlol, losartan. When switched from heparin to [place] would stop aspirin. F/u echo today, if no significant findings would be ok for discharge from cardiac standpoint.   Keep 05/31/21 appt with Dr Areta Haber, MD  05/19/2021, 9:13 AM

## 2021-05-19 NOTE — Progress Notes (Signed)
Eliquis Rx card given and explained to patient

## 2021-05-20 DIAGNOSIS — I10 Essential (primary) hypertension: Secondary | ICD-10-CM | POA: Diagnosis not present

## 2021-05-20 DIAGNOSIS — E782 Mixed hyperlipidemia: Secondary | ICD-10-CM

## 2021-05-20 DIAGNOSIS — E663 Overweight: Secondary | ICD-10-CM | POA: Diagnosis not present

## 2021-05-20 DIAGNOSIS — I2699 Other pulmonary embolism without acute cor pulmonale: Secondary | ICD-10-CM | POA: Diagnosis not present

## 2021-05-20 MED ORDER — APIXABAN 5 MG PO TABS: ORAL_TABLET | ORAL | 0 refills | Status: DC

## 2021-05-20 MED ORDER — ATORVASTATIN CALCIUM 80 MG PO TABS: 80.0000 mg | ORAL_TABLET | Freq: Every day | ORAL | 1 refills | Status: DC

## 2021-05-20 MED ORDER — METOPROLOL TARTRATE 25 MG PO TABS: 12.5000 mg | ORAL_TABLET | Freq: Two times a day (BID) | ORAL | 1 refills | Status: DC

## 2021-05-20 MED ORDER — APIXABAN 5 MG PO TABS
ORAL_TABLET | ORAL | 0 refills | Status: DC
Start: 2021-05-20 — End: 2021-06-14

## 2021-05-20 NOTE — Progress Notes (Signed)
Patient discharged via wheelchair in stable condition. Pt drove himself to the hospital and pt will drive himself home. MD was aware of this. Went over AVS instructions with pt and he verbalized understanding with no further questions/concerns. Pt discharged with Eliquis Rx card as well.

## 2021-05-20 NOTE — Discharge Summary (Signed)
Discharge Summary  William Park GUR:[id number] DOB: 04-Apr-1951  PCP: Marin Olp, MD  Admit date: 05/18/2021 Discharge date: 05/20/2021  Time spent: 25 minutes  Recommendations for Outpatient Follow-up:  New medication: Eliquis 10 mg p.o. twice daily x6 days, then decrease to 5 mg p.o. twice daily for the next 6 months While on Eliquis, patient will not take aspirin, Aleve or Mobic New medication: Metoprolol 12.5 mg p.o. twice daily Medication change: Simvastatin discontinued New medication: Atorvastatin 80 mg p.o. daily  Discharge Diagnoses:  Active Hospital Problems   Diagnosis Date Noted   Pulmonary embolism ([place]) 05/18/2021   Overweight (BMI 25.0-29.9) 05/19/2021   Hyperglycemia 05/18/2021   Alcohol use 05/18/2021   CAD (coronary artery disease)    Hyperlipidemia    Hypertension    GERD (gastroesophageal reflux disease)     Resolved Hospital Problems  No resolved problems to display.    Discharge Condition: Improved, being discharged home  Diet recommendation: Heart healthy  Vitals:   05/20/21 0556 05/20/21 0922  BP: 116/78 133/80  Pulse: 62 71  Resp: 17 16  Temp: 97.9 F (36.6 C) 97.7 F (36.5 C)  SpO2: 96% 98%    History of present illness:  70 year old male with past medical history of non-STEMI, CAD and hypertension admitted on 10/21 after being sent to the emergency room after outpatient CTA revealed acute pulmonary embolus.  Patient started on IV heparin infusion and admitted to hospitalist service.  He had noted increased shortness of breath in the past few weeks prior to coming in.  3 months prior, he has had back surgery and has been off his feet for an extended period of time.  Hospital Course:  Principal Problem:   Pulmonary embolism ([place]): Heparin changed over to Eliquis.  Patient tolerated this well.  Discussed extensively in terms of education.  Patient will continue on Eliquis for the next 6 months.  During this time, he will  not take any kind of aspirin or NSAIDs.  Echocardiogram notes no right-sided dysfunction. Active Problems:   CAD (coronary artery disease) with hyperlipidemia: Statin changed from simvastatin to atorvastatin.  New prescription given.     Hypertension: Continued on ACE.  Seen by cardiology and metoprolol added as well.    GERD (gastroesophageal reflux disease): Continue PPI   Hyperglycemia   Alcohol use: Monitor for alcohol withdrawal.  No evidence of.    Overweight (BMI 25.0-29.9): Meets criteria BMI greater than 25   Procedures: 2D echocardiogram done 10/22 notes preserved ejection fraction, no evidence of diastolic dysfunction or valvular dysfunction.  Consultations: Cardiology  Discharge Exam: BP 133/80 (BP Location: Right Arm)   Pulse 71   Temp 97.7 F (36.5 C) (Oral)   Resp 16   Ht 5\' 11"  (1.803 m)   Wt 87.4 kg   SpO2 98%   BMI 26.87 kg/m   General: Alert and oriented x3, no acute distress Cardiovascular: Regular rate and rhythm, S1-S2 Respiratory: Clear to auscultation bilaterally  Discharge Instructions You were cared for by a hospitalist during your hospital stay. If you have any questions about your discharge medications or the care you received while you were in the hospital after you are discharged, you can call the unit and asked to speak with the hospitalist on call if the hospitalist that took care of you is not available. Once you are discharged, your primary care physician will handle any further medical issues. Please note that NO REFILLS for any discharge medications will be authorized once  you are discharged, as it is imperative that you return to your primary care physician (or establish a relationship with a primary care physician if you do not have one) for your aftercare needs so that they can reassess your need for medications and monitor your lab values.  Discharge Instructions     Diet - low sodium heart healthy   Complete by: As directed     Increase activity slowly   Complete by: As directed       Allergies as of 05/20/2021   No Known Allergies      Medication List     STOP taking these medications    aspirin 81 MG tablet   meloxicam 15 MG tablet Commonly known as: MOBIC   naproxen sodium 220 MG tablet Commonly known as: ALEVE   simvastatin 80 MG tablet Commonly known as: ZOCOR       TAKE these medications    acetaminophen 500 MG tablet Commonly known as: TYLENOL Take 1,000 mg by mouth every 6 (six) hours as needed for moderate pain.   amLODipine 5 MG tablet Commonly known as: NORVASC Take 1 tablet (5 mg total) by mouth daily.   apixaban 5 MG Tabs tablet Commonly known as: ELIQUIS 2 tabs (10mg ) po BID x 6 days, then 1 tab po bid   atorvastatin 80 MG tablet Commonly known as: LIPITOR Take 1 tablet (80 mg total) by mouth daily.   busPIRone 5 MG tablet Commonly known as: BUSPAR Take 1 tablet (5 mg total) by mouth 3 (three) times daily as needed.   docusate sodium 100 MG capsule Commonly known as: COLACE Take 1 capsule (100 mg total) by mouth 2 (two) times daily. What changed:  when to take this reasons to take this   famotidine 20 MG tablet Commonly known as: PEPCID Take 1 tablet (20 mg total) by mouth 2 (two) times daily.   fluticasone 50 MCG/ACT nasal spray Commonly known as: FLONASE Place 2 sprays into both nostrils daily as needed for allergies or rhinitis.   gabapentin 300 MG capsule Commonly known as: NEURONTIN Take 300 mg by mouth 3 (three) times daily.   losartan 50 MG tablet Commonly known as: COZAAR TAKE ONE TABLET BY MOUTH ONCE DAILY What changed:  how to take this when to take this   metoprolol tartrate 25 MG tablet Commonly known as: LOPRESSOR Take 0.5 tablets (12.5 mg total) by mouth 2 (two) times daily.   omeprazole 40 MG capsule Commonly known as: PRILOSEC Take 1 capsule (40 mg total) by mouth daily. [place]   sildenafil 100 MG  tablet Commonly known as: Viagra Take 1 tablet (100 mg total) by mouth daily as needed for erectile dysfunction.       No Known Allergies    The results of significant diagnostics from this hospitalization (including imaging, microbiology, ancillary and laboratory) are listed below for reference.    Significant Diagnostic Studies: CT Angio Chest Pulmonary Embolism (PE) W or WO Contrast  Addendum Date: 05/18/2021   ADDENDUM REPORT: 05/18/2021 11:27 ADDENDUM: Critical Value/emergent results were called by telephone at the time of interpretation on 05/18/2021 at 11:27 am to provider [facility] , who verbally acknowledged these results. Electronically Signed   By: Zetta Bills M.D.   On: 05/18/2021 11:27   Result Date: 05/18/2021 CLINICAL DATA:  Suspected pulmonary embolism in a 70 year old male with embolism discovered on cardiac evaluation. EXAM: CT ANGIOGRAPHY CHEST WITH CONTRAST TECHNIQUE: Multidetector CT imaging of the chest was  performed using the standard protocol during bolus administration of intravenous contrast. Multiplanar CT image reconstructions and MIPs were obtained to evaluate the vascular anatomy. CONTRAST:  85mL OMNIPAQUE IOHEXOL 350 MG/ML SOLN COMPARISON:  Comparison made with May 08, 2021. FINDINGS: Cardiovascular: RIGHT middle and lower lobe PE extending from the RIGHT pulmonary artery into lower lobe and middle lobe branches. Middle lobe emboli appear new compared to the study of May 08, 2021 RV to LV ratio of 0.91. Aorta is of normal caliber. Heart size is normal without substantial pericardial effusion. Calcified coronary artery disease as outlined in previous imaging. Mediastinum/Nodes: No thoracic inlet, axillary, mediastinal or hilar adenopathy. Esophagus grossly normal. Lungs/Pleura: Basilar atelectasis. Small nodule at the RIGHT lung base 9 x 7 mm (image 79/10) more bandlike area of airspace disease in this location on the previous study. No effusion.  Airways are patent. Upper Abdomen: Liver displays fissural widening and lobular/mildly nodular contours. No pericholecystic stranding. Imaged portions of pancreas are unremarkable. Spleen is suspected to be normal size but incompletely imaged. No acute gastrointestinal process in the upper abdomen. Musculoskeletal: No acute bone finding. No destructive bone process. Spinal degenerative changes. Review of the MIP images confirms the above findings. IMPRESSION: Positive for acute pulmonary embolism in RIGHT pulmonary arterial branches with top-normal RV to LV ratio may be an indicator of RIGHT heart strain. Middle lobe thrombus appears new compared to previous imaging. RV to LV ratio was similar on the previous imaging study. Correlate with any new or worsening symptoms and consider echocardiographic correlation for further assessment. Small nodule at the RIGHT lung base 9 x 7 mm. More bandlike area of airspace disease in this location on the previous study. This may represent a small pulmonary infarct mixed with atelectasis. Consider three-month follow-up to ensure resolution and exclude the possibility of underlying pulmonary nodule. Calcified coronary artery disease. Liver displays fissural widening and lobular/mildly nodular contours. Correlate with any clinical or laboratory evidence of liver disease. Critical Value/emergent results were called by telephone at the time of interpretation on 05/18/2021 at 11:21 am to provider Nurse Altha Harm, who verbally acknowledged these results. Aortic Atherosclerosis (ICD10-I70.0). Electronically Signed: By: Zetta Bills M.D. On: 05/18/2021 11:21   CT CORONARY MORPH W/CTA COR W/SCORE W/CA W/CM &/OR WO/CM  Addendum Date: 05/17/2021   ADDENDUM REPORT: 05/17/2021 14:31 EXAM: OVER-READ INTERPRETATION  CT CHEST The following report is an over-read performed by radiologist Dr. Abigail Miyamoto of [facility], [place] on 05/17/2021. This over-read does not include  interpretation of cardiac or coronary anatomy or pathology. The coronary CTA interpretation by the cardiologist is attached. COMPARISON:  None. FINDINGS: Vascular: Aortic atherosclerosis. Filling defects within the right lower lobe pulmonary arterial tree including on 22/10, 54 sagittal, and 89 coronal on this nondedicated exam. Mediastinum/Nodes: No imaged thoracic adenopathy. Lungs/Pleura: No pleural fluid. Hypoventilation, with areas of subsegmental atelectasis in both lungs. Upper Abdomen: Normal imaged portions of the liver, spleen, stomach, gallbladder. Musculoskeletal: No acute osseous abnormality. IMPRESSION: 1. Right lower lobe pulmonary arterial filling defects are most consistent with pulmonary emboli on this nondedicated exam. 2.  Aortic Atherosclerosis (ICD10-I70.0). These results will be called to the ordering clinician or representative by the Radiologist Assistant, and communication documented in the PACS or [employer]. Electronically Signed   By: Abigail Miyamoto M.D.   On: 05/17/2021 14:31   Result Date: 05/17/2021 CLINICAL DATA:  Chest pain EXAM: Cardiac CTA MEDICATIONS: Sub lingual nitro. 4mg  and lopressor 100mg  TECHNIQUE: The patient was scanned on a [employer] 192  slice scanner. Gantry rotation speed was 250 msecs. Collimation was .6 mm. A 100 kV prospective scan was triggered in the ascending thoracic aorta at 140 HU's Full mA was used between 35% and 75% of the R-R interval. Average HR during the scan was 57 bpm. The 3D data set was interpreted on a dedicated work station using MPR, MIP and VRT modes. A total of 80cc of contrast was used. FINDINGS: Non-cardiac: See separate report from [facility]. No significant findings on limited lung and soft tissue windows. Calcium Score: Severe 3 vessel coronary calcium LM: 0 LAD: 687 RCA: 403 LCX: 108 Total:  1198 Coronary Arteries: Right dominant with no anomalies LM: 1-24% calcified plaque proximally LAD: 40-59% calcified plaque  proximal/mid and distal IM: Small vessel 1-24% calcified plaque in mid vessel D1: Large vessel 40-59% calcified plaque proximal and mid Circumflex: 40-59% calcified plaque proximally and mid vessel OM1: 1-24% calcified plaque AV Groove small not well seen RCA: 25-49% calcified plaque proximally with external remodeling 1-24% mixed plaque mid vessel PDA: Normal PLA: Normal IMPRESSION: 1. Severe 3 vessel calcium score 1198 which is 48 th percentile for age and sex 2.  Normal ascending aortic root diameter 3.2 cm 3. Moderate CAD in D1, LAD and circumflex see description above Study sent for [employer] CT [place] Electronically Signed: By: [place] Rouge M.D. On: 05/08/2021 16:29   CT CORONARY FRACTIONAL FLOW RESERVE DATA PREP  Result Date: 05/09/2021 CLINICAL DATA:  CAD EXAM: FFR CT TECHNIQUE: The patients best systolic and diastolic phases of gated cardiac CTA sent to [facility] for hemodynamic analysis FINDINGS: FFR CT normal in RCA and LCX and all there branches Positive in mid to distal LAD LAD: Proximal 0.96, mid 93 distal 0.77 IMPRESSION: Positive FFR CT in mid to distal LAD 0.77 [place] Electronically Signed   By: Jenkins Rouge M.D.   On: 05/09/2021 08:52   ECHOCARDIOGRAM COMPLETE  Result Date: 05/19/2021    ECHOCARDIOGRAM REPORT   Patient Name:   RICHERD GRIME WHITE Park Date of Exam: 05/19/2021 Medical Rec #:  [id number]             Height:       71.0 in Accession #:    [id number]            Weight:       192.7 lb Date of Birth:  27-Sep-1950            BSA:          2.075 m Patient Age:    25 years              BP:           134/80 mmHg Patient Gender: M                     HR:           60 bpm. Exam Location:  Inpatient Procedure: 2D Echo, Cardiac Doppler and Color Doppler Indications:    Pulmonary Embolus I26.09  History:        Patient has no prior history of Echocardiogram examinations.  Sonographer:    Merrie Roof RDCS Referring Phys: [id number] [place]  1. Left ventricular  ejection fraction, by estimation, is 60 to 65%. The left ventricle has normal function. The left ventricle has no regional wall motion abnormalities. Left ventricular diastolic parameters are indeterminate.  2. Right ventricular systolic function is normal. The right ventricular size is normal. There  is normal pulmonary artery systolic pressure.  3. Left atrial size was mildly dilated.  4. Right atrial size was mildly dilated.  5. The mitral valve is normal in structure. Trivial mitral valve regurgitation. No evidence of mitral stenosis.  6. The aortic valve is grossly normal. Aortic valve regurgitation is not visualized. No aortic stenosis is present.  7. The inferior vena cava is normal in size with greater than 50% respiratory variability, suggesting right atrial pressure of 3 mmHg. Comparison(s): No prior Echocardiogram. Conclusion(s)/Recommendation(s): Normal biventricular function without evidence of hemodynamically significant valvular heart disease. No evidence of RV enlargement, normal RV function. FINDINGS  Left Ventricle: Left ventricular ejection fraction, by estimation, is 60 to 65%. The left ventricle has normal function. The left ventricle has no regional wall motion abnormalities. The left ventricular internal cavity size was normal in size. There is  no left ventricular hypertrophy. Left ventricular diastolic parameters are indeterminate. Right Ventricle: The right ventricular size is normal. No increase in right ventricular wall thickness. Right ventricular systolic function is normal. There is normal pulmonary artery systolic pressure. The tricuspid regurgitant velocity is 2.44 m/s, and  with an assumed right atrial pressure of 3 mmHg, the estimated right ventricular systolic pressure is 96.2 mmHg. Left Atrium: Left atrial size was mildly dilated. Right Atrium: Right atrial size was mildly dilated. Pericardium: There is no evidence of pericardial effusion. Presence of pericardial fat pad. Mitral  Valve: The mitral valve is normal in structure. Trivial mitral valve regurgitation. No evidence of mitral valve stenosis. Tricuspid Valve: The tricuspid valve is normal in structure. Tricuspid valve regurgitation is trivial. No evidence of tricuspid stenosis. Aortic Valve: The aortic valve is grossly normal. Aortic valve regurgitation is not visualized. No aortic stenosis is present. Aortic valve mean gradient measures 3.0 mmHg. Aortic valve peak gradient measures 5.8 mmHg. Aortic valve area, by VTI measures 2.92 cm. Pulmonic Valve: The pulmonic valve was not well visualized. Pulmonic valve regurgitation is trivial. No evidence of pulmonic stenosis. Aorta: The aortic root, ascending aorta and aortic arch are all structurally normal, with no evidence of dilitation or obstruction. Venous: The inferior vena cava is normal in size with greater than 50% respiratory variability, suggesting right atrial pressure of 3 mmHg. IAS/Shunts: No atrial level shunt detected by color flow Doppler.  LEFT VENTRICLE PLAX 2D LVIDd:         4.90 cm   Diastology LVIDs:         3.30 cm   LV e' medial:    6.53 cm/s LV PW:         0.90 cm   LV E/e' medial:  9.2 LV IVS:        0.90 cm   LV e' lateral:   11.70 cm/s LVOT diam:     2.00 cm   LV E/e' lateral: 5.2 LV SV:         74 LV SV Index:   36 LVOT Area:     3.14 cm  RIGHT VENTRICLE RV Basal diam:  3.30 cm LEFT ATRIUM             Index        RIGHT ATRIUM           Index LA diam:        4.80 cm 2.31 cm/m   RA Area:     16.30 cm LA Vol (A2C):   66.7 ml 32.14 ml/m  RA Volume:   42.20 ml  20.33 ml/m LA Vol (A4C):  59.5 ml 28.67 ml/m LA Biplane Vol: 63.0 ml 30.36 ml/m  AORTIC VALVE AV Area (Vmax):    2.88 cm AV Area (Vmean):   2.72 cm AV Area (VTI):     2.92 cm AV Vmax:           120.00 cm/s AV Vmean:          78.700 cm/s AV VTI:            0.254 m AV Peak Grad:      5.8 mmHg AV Mean Grad:      3.0 mmHg LVOT Vmax:         110.00 cm/s LVOT Vmean:        68.100 cm/s LVOT VTI:           0.236 m LVOT/AV VTI ratio: 0.93  AORTA Ao Root diam: 3.00 cm MITRAL VALVE               TRICUSPID VALVE MV Area (PHT): 3.99 cm    TR Peak grad:   23.8 mmHg MV Decel Time: 190 msec    TR Vmax:        244.00 cm/s MV E velocity: 60.40 cm/s MV A velocity: 80.60 cm/s  SHUNTS MV E/A ratio:  0.75        Systemic VTI:  0.24 m                            Systemic Diam: 2.00 cm Buford Dresser MD Electronically signed by Buford Dresser MD Signature Date/Time: 05/19/2021/12:02:12 PM    Final    VAS [place] LOWER EXTREMITY VENOUS (DVT)  Result Date: 05/20/2021  Lower Venous DVT Study Patient Name:  NUNZIO BANET WHITE Park  Date of Exam:   05/19/2021 Medical Rec #: [id number]              Accession #:    [id number] Date of Birth: [birth year]             Patient Gender: M Patient Age:   43 years Exam Location:  [facility] Procedure:      VAS [place] LOWER EXTREMITY VENOUS (DVT) Referring Phys: DAVID ORTIZ --------------------------------------------------------------------------------  Indications: Pulmonary embolism.  Risk Factors: Confirmed PE. Anticoagulation: Heparin. Comparison Study: no prior Performing Technologist: Antonieta Pert RDMS, RVT  Examination Guidelines: A complete evaluation includes B-mode imaging, spectral Doppler, color Doppler, and power Doppler as needed of all accessible portions of each vessel. Bilateral testing is considered an integral part of a complete examination. Limited examinations for reoccurring indications may be performed as noted. The reflux portion of the exam is performed with the patient in reverse Trendelenburg.  +---------+---------------+---------+-----------+----------+--------------+ RIGHT    CompressibilityPhasicitySpontaneityPropertiesThrombus Aging +---------+---------------+---------+-----------+----------+--------------+ CFV      Full           Yes      Yes                                  +---------+---------------+---------+-----------+----------+--------------+ SFJ      Full                                                        +---------+---------------+---------+-----------+----------+--------------+ FV Prox  Full                                                        +---------+---------------+---------+-----------+----------+--------------+  FV Mid   Full                                                        +---------+---------------+---------+-----------+----------+--------------+ FV DistalFull                                                        +---------+---------------+---------+-----------+----------+--------------+ PFV      Full                                                        +---------+---------------+---------+-----------+----------+--------------+ POP      Full           Yes      Yes                                 +---------+---------------+---------+-----------+----------+--------------+ PTV      Full                                                        +---------+---------------+---------+-----------+----------+--------------+ PERO     Full                                                        +---------+---------------+---------+-----------+----------+--------------+   +---------+---------------+---------+-----------+----------+--------------+ LEFT     CompressibilityPhasicitySpontaneityPropertiesThrombus Aging +---------+---------------+---------+-----------+----------+--------------+ CFV      Full           Yes      Yes                                 +---------+---------------+---------+-----------+----------+--------------+ SFJ      Full                                                        +---------+---------------+---------+-----------+----------+--------------+ FV Prox  Full                                                         +---------+---------------+---------+-----------+----------+--------------+ FV Mid   Full                                                        +---------+---------------+---------+-----------+----------+--------------+  FV DistalFull                                                        +---------+---------------+---------+-----------+----------+--------------+ PFV      Full                                                        +---------+---------------+---------+-----------+----------+--------------+ POP      Full           Yes      Yes                                 +---------+---------------+---------+-----------+----------+--------------+ PTV      None                               dilated   Acute          +---------+---------------+---------+-----------+----------+--------------+ PERO     Full                                                        +---------+---------------+---------+-----------+----------+--------------+     Summary: RIGHT: - There is no evidence of deep vein thrombosis in the lower extremity.  - No cystic structure found in the popliteal fossa.  LEFT: - Findings consistent with acute deep vein thrombosis involving the left posterior tibial veins. - No cystic structure found in the popliteal fossa.  *See table(s) above for measurements and observations. Electronically signed by Deitra Mayo MD on 05/20/2021 at 6:26:59 AM.    Final     Microbiology: Recent Results (from the past 240 hour(s))  Resp Panel by RT-PCR (Flu A&B, Covid) Nasopharyngeal Swab     Status: None   Collection Time: 05/18/21  1:35 PM   Specimen: Nasopharyngeal Swab; Nasopharyngeal(NP) swabs in vial transport medium  Result Value Ref Range Status   SARS Coronavirus 2 by RT PCR NEGATIVE NEGATIVE Final    Comment: (NOTE) SARS-CoV-2 target nucleic acids are NOT DETECTED.  The SARS-CoV-2 RNA is generally detectable in upper respiratory specimens during the acute  phase of infection. The lowest concentration of SARS-CoV-2 viral copies this assay can detect is 138 copies/mL. A negative result does not preclude SARS-Cov-2 infection and should not be used as the sole basis for treatment or other patient management decisions. A negative result may occur with  improper specimen collection/handling, submission of specimen other than nasopharyngeal swab, presence of viral mutation(s) within the areas targeted by this assay, and inadequate number of viral copies(<138 copies/mL). A negative result must be combined with clinical observations, patient history, and epidemiological information. The expected result is Negative.  Fact Sheet for Patients:  EntrepreneurPulse.com.au  Fact Sheet for Healthcare Providers:  IncredibleEmployment.be  This test is no t yet approved or cleared by the [place] FDA and  has been authorized for detection and/or diagnosis of SARS-CoV-2 by FDA under an Emergency  Use Authorization (EUA). This EUA will remain  in effect (meaning this test can be used) for the duration of the COVID-19 declaration under Section 564(b)(1) of the Act, 21 U.S.C.section 360bbb-3(b)(1), unless the authorization is terminated  or revoked sooner.       Influenza A by PCR NEGATIVE NEGATIVE Final   Influenza B by PCR NEGATIVE NEGATIVE Final    Comment: (NOTE) The Xpert Xpress SARS-CoV-2/FLU/RSV plus assay is intended as an aid in the diagnosis of influenza from Nasopharyngeal swab specimens and should not be used as a sole basis for treatment. Nasal washings and aspirates are unacceptable for Xpert Xpress SARS-CoV-2/FLU/RSV testing.  Fact Sheet for Patients: EntrepreneurPulse.com.au  Fact Sheet for Healthcare Providers: IncredibleEmployment.be  This test is not yet approved or cleared by the [place] FDA and has been authorized for detection and/or diagnosis of  SARS-CoV-2 by FDA under an Emergency Use Authorization (EUA). This EUA will remain in effect (meaning this test can be used) for the duration of the COVID-19 declaration under Section 564(b)(1) of the Act, 21 U.S.C. section 360bbb-3(b)(1), unless the authorization is terminated or revoked.  Performed at [facility], [place] [address], [place], [place] [address]      Labs: Basic Metabolic Panel: Recent Labs  Lab 05/18/21 1230  NA 138  K 4.2  CL 104  CO2 25  GLUCOSE 108*  BUN 17  CREATININE 1.04  CALCIUM 9.0   Liver Function Tests: No results for input(s): AST, ALT, ALKPHOS, BILITOT, PROT, ALBUMIN in the last 168 hours. No results for input(s): LIPASE, AMYLASE in the last 168 hours. No results for input(s): AMMONIA in the last 168 hours. CBC: Recent Labs  Lab 05/18/21 1230 05/19/21 0530  WBC 8.7 7.2  NEUTROABS 4.2  --   HGB 15.2 13.6  HCT 43.9 40.4  MCV 96.5 98.3  PLT 167 140*   Cardiac Enzymes: No results for input(s): CKTOTAL, CKMB, CKMBINDEX, TROPONINI in the last 168 hours. BNP: BNP (last 3 results) No results for input(s): BNP in the last 8760 hours.  ProBNP (last 3 results) No results for input(s): PROBNP in the last 8760 hours.  CBG: No results for input(s): GLUCAP in the last 168 hours.     Signed:  Annita Brod, MD [facility] 05/20/2021, 9:44 AM

## 2021-05-20 NOTE — TOC Transition Note (Signed)
Transition of Care [facility]) - CM/SW Discharge Note   Patient Details  Name: William Park MRN: [id number] Date of Birth: 14-Jul-1951  Transition of Care [facility]) CM/SW Contact:  Ross Ludwig, LCSW Phone Number: 05/20/2021, 10:27 AM   Clinical Narrative:     CSW was informed that patient needs some substance abuse resources.  CSW added substance abuse resources to patient's AVS.  CSW signing off, please reconsult with other social work needs.   Final next level of care: Home/Self Care Barriers to Discharge: Barriers Resolved   Patient Goals and CMS Choice Patient states their goals for this hospitalization and ongoing recovery are:: To return back home.      Discharge Placement                       Discharge Plan and Services                                     Social Determinants of Health (SDOH) Interventions     Readmission Risk Interventions No flowsheet data found.

## 2021-05-21 ENCOUNTER — Encounter (HOSPITAL_COMMUNITY): Payer: Self-pay

## 2021-05-21 ENCOUNTER — Ambulatory Visit (HOSPITAL_COMMUNITY): Payer: Medicare Other

## 2021-05-22 DIAGNOSIS — M5441 Lumbago with sciatica, right side: Secondary | ICD-10-CM | POA: Diagnosis not present

## 2021-05-24 NOTE — Telephone Encounter (Signed)
Noted. Provider aware.  Thanks!

## 2021-05-28 DIAGNOSIS — M5441 Lumbago with sciatica, right side: Secondary | ICD-10-CM | POA: Diagnosis not present

## 2021-05-31 ENCOUNTER — Other Ambulatory Visit: Payer: Self-pay

## 2021-05-31 ENCOUNTER — Encounter: Payer: Self-pay | Admitting: Cardiovascular Disease

## 2021-05-31 ENCOUNTER — Ambulatory Visit (INDEPENDENT_AMBULATORY_CARE_PROVIDER_SITE_OTHER): Payer: Medicare Other | Admitting: Cardiovascular Disease

## 2021-05-31 VITALS — BP 132/69 | HR 53 | Ht 71.0 in | Wt 215.2 lb

## 2021-05-31 DIAGNOSIS — Z7901 Long term (current) use of anticoagulants: Secondary | ICD-10-CM

## 2021-05-31 DIAGNOSIS — Z9861 Coronary angioplasty status: Secondary | ICD-10-CM

## 2021-05-31 DIAGNOSIS — R079 Chest pain, unspecified: Secondary | ICD-10-CM

## 2021-05-31 DIAGNOSIS — E785 Hyperlipidemia, unspecified: Secondary | ICD-10-CM | POA: Diagnosis not present

## 2021-05-31 DIAGNOSIS — I2699 Other pulmonary embolism without acute cor pulmonale: Secondary | ICD-10-CM | POA: Diagnosis not present

## 2021-05-31 DIAGNOSIS — R931 Abnormal findings on diagnostic imaging of heart and coronary circulation: Secondary | ICD-10-CM | POA: Diagnosis not present

## 2021-05-31 DIAGNOSIS — I82461 Acute embolism and thrombosis of right calf muscular vein: Secondary | ICD-10-CM

## 2021-05-31 DIAGNOSIS — I2510 Atherosclerotic heart disease of native coronary artery without angina pectoris: Secondary | ICD-10-CM

## 2021-05-31 DIAGNOSIS — Z8659 Personal history of other mental and behavioral disorders: Secondary | ICD-10-CM

## 2021-05-31 MED ORDER — AMLODIPINE BESYLATE 5 MG PO TABS: 7.5000 mg | ORAL_TABLET | Freq: Every day | ORAL | 3 refills | Status: DC

## 2021-05-31 MED ORDER — EZETIMIBE 10 MG PO TABS: 10.0000 mg | ORAL_TABLET | Freq: Every day | ORAL | 3 refills | Status: DC

## 2021-05-31 NOTE — Progress Notes (Signed)
Cardiology Office Note    Date:  06/07/2021   ID:  William Park, DOB Dec 19, 1950, MRN [id number]  PCP:  Shelva Majestic, MD  Cardiologist:  Nicki Guadalajara, MD   One month F/U  cardiology evaluation initially referred through the courtesy of Dr. Tana Conch for establishment of cardiology care.  History of Present Illness:  William Park is a 70 y.o. male who has a history of known CAD, and apparently had undergone remote PTCA by me in 1990 currently had 3 blockages.  He underwent a subsequent PTCA in 1991 and his last catheterization was in 1994.  Subsequently, his insurance changed, and he has been evaluated by Dr. Earna Coder in [place] for his cardiology care.  States his last treadmill test was approximately 2 years ago and he was told that this was "okay."He has had some difficulty with family members with a daughter being a drug addict and having served time in prison and is getting back in prison and another daughter with issues.  He has been followed by Dr. Tana Conch for his primary care.  He had undergone a lower extremity arterial duplex study in July 2022 due to leg swelling and there was no evidence for DVT.  After not having seen him in over 25 years, presented to establish cardiology care with me again in [place] instead of [place] when I saw him for my initial evaluation on May 01, 2021.  Over the last 10 months, he has started to notice slight progressive development of exertional shortness of breath with less activity.  He denies any chest pressure.  He is unaware of any arrhythmias.  He denies PND or orthopnea.  He has had some back and right leg discomfort.  Has a history of depression and is on risperidone.  He has been on losartan 50 mg twice a day for hypertension, famotidine for GERD in addition at times to omeprazole.  He is on simvastatin 40 mg for hyperlipidemia and apparently has a prescription for sildenafil as needed for erectile  dysfunction.  During my initial evaluation, his blood pressure was elevated insistent with stage II hypertension.  His losartan dose had recently been increased to 50 mg twice a day and at that time I recommended the addition of amlodipine 5 mg to his medical regimen.  With his exertional dyspnea I scheduled him for 2D echo Doppler study.  Since his prior evaluations were PTCA and not stents to further evaluate his coronary obstructive disease I scheduled him for coronary CTA evaluation.  Also recommend laboratory checked including a CMP, CBC, TSH lipid studies and LP(a).  Since I saw him, he apparently was in the hospital from October 21 through May 20, 2021.  He had undergone his CT angio of his chest which had been ordered by me on May 18, 2021 this demonstrated probable acute pulmonary embolism in the right pulmonary arterial branches and middle lobe thrombus.  As result he was hospitalized with PE and was found to have a left DVT on lower extremity venous Doppler study.  Was started on Eliquis 5 mg twice a day.  Of note, his coronary calcium score on his coronary CTA was elevated at 1198 representing 87 percentile for age and sex.  Was moderate CAD in the LAD, first diagonal and circumflex vessels.  FFR was normal in the RCA and circumflex vessel.  In the LAD proximal was 0.96, mid 0.93, and distal was positive at 0.77.   Presently, his  shortness of breath has improved.  At present he is on amlodipine 5 mg, losartan 50 mg, and metoprolol tartrate 12.5 mg twice a day for his hypertension.  He is on Eliquis 5 mg twice a day for anticoagulation.  He is on BuSpar.  He presents for follow-up evaluation.   Past Medical History:  Diagnosis Date   Allergic rhinitis    allegra and flonase   CAD (coronary artery disease)    Dr. [name] s/p 4 angioplasty's with last in 37. ASA. simvastatin $RemoveBeforeDE'80mg'upocDTHIVVVDjwe$    GERD (gastroesophageal reflux disease)    nexium $Remov'40mg'KJCGrs$  and zantac BID-mout fills with saliva and coughing.  Seeing GI in [place] 12/2014   Hyperlipidemia    simvastatin $RemoveBefo'80mg'DGtxBaFgPpj$ , niaspan $RemoveB'750mg'ieyzSGkp$    Hypertension    losartan $RemoveB'50mg'dqlWnPfr$    Myocardial infarction ([facility])    "small MI, mild"- 1994   Osteoarthritis    mainly hands. Mobic $RemoveBefo'15mg'xhZGPnedQcH$     Past Surgical History:  Procedure Laterality Date   ANGIOPLASTY     x4   COLONOSCOPY     EYE SURGERY     LUMBAR LAMINECTOMY/DECOMPRESSION MICRODISCECTOMY Right 02/19/2021   Procedure: L4-L5 LAMINOTOMY/FORAMINOTOMY;  Surgeon: Newman Pies, MD;  Location: [facility];  Service: Neurosurgery;  Laterality: Right;  3C   pyloric stenosis     as a child   TONSILLECTOMY     UPPER GASTROINTESTINAL ENDOSCOPY      Current Medications: Outpatient Medications Prior to Visit  Medication Sig Dispense Refill   acetaminophen (TYLENOL) 500 MG tablet Take 1,000 mg by mouth every 6 (six) hours as needed for moderate pain.     apixaban (ELIQUIS) 5 MG TABS tablet 2 tabs ($Remov'10mg'NnTDDh$ ) po BID x 6 days, then 1 tab po bid 74 tablet 0   atorvastatin (LIPITOR) 80 MG tablet Take 1 tablet (80 mg total) by mouth daily. 30 tablet 1   busPIRone (BUSPAR) 5 MG tablet Take 1 tablet (5 mg total) by mouth 3 (three) times daily as needed. 270 tablet 3   docusate sodium (COLACE) 100 MG capsule Take 1 capsule (100 mg total) by mouth 2 (two) times daily. (Patient taking differently: Take 100 mg by mouth 2 (two) times daily as needed for mild constipation.) 10 capsule 0   famotidine (PEPCID) 20 MG tablet Take 1 tablet (20 mg total) by mouth 2 (two) times daily. 180 tablet 3   fluticasone (FLONASE) 50 MCG/ACT nasal spray Place 2 sprays into both nostrils daily as needed for allergies or rhinitis.     gabapentin (NEURONTIN) 300 MG capsule Take 300 mg by mouth 3 (three) times daily.     losartan (COZAAR) 50 MG tablet TAKE ONE TABLET BY MOUTH ONCE DAILY (Patient taking differently: 50 mg in the morning and at bedtime.) 90 tablet 1   metoprolol tartrate (LOPRESSOR) 25 MG tablet Take 0.5 tablets (12.5 mg total) by mouth 2  (two) times daily. 30 tablet 1   omeprazole (PRILOSEC) 40 MG capsule Take 1 capsule (40 mg total) by mouth daily. [place].     sildenafil (VIAGRA) 100 MG tablet Take 1 tablet (100 mg total) by mouth daily as needed for erectile dysfunction. 100 tablet 11   amLODipine (NORVASC) 5 MG tablet Take 1 tablet (5 mg total) by mouth daily. 180 tablet 3   No facility-administered medications prior to visit.     Allergies:   Patient has no known allergies.   Social History   Socioeconomic History   Marital status: Divorced    Spouse name: Not  on file   Number of children: Not on file   Years of education: Not on file   Highest education level: Not on file  Occupational History   Not on file  Tobacco Use   Smoking status: Former    Packs/day: 2.00    Years: 25.00    Pack years: 50.00    Types: Cigarettes    Quit date: 07/30/1987    Years since quitting: 33.8   Smokeless tobacco: Never  Vaping Use   Vaping Use: Never used  Substance and Sexual Activity   Alcohol use: Not Currently   Drug use: No   Sexual activity: Not on file  Other Topics Concern   Not on file  Social History Narrative   Family: Divorced. 2 daughters. 3 granddaughters. Doesn't get to see them.       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  He has 2 children.  He previously worked as a as a Administrator.  He denies tobacco use.  He does drink occasional alcohol.  He  does not exercise.  Family History:  The patient's family history includes AAA (abdominal aortic aneurysm) in his father; Breast cancer in his mother; CAD in his brother and father; Congestive Heart Failure in his mother; Dementia in his father; Kidney disease in his father.   His mother died at age 76 with cancer.  Father died at 74 and had heart disease and kidney problems.  He has a brother age 64 with heart disease and another brother age 36.  ROS General: Negative; No fevers, chills, or night sweats;  HEENT: Occasional tinnitus,  No changes in vision or hearing, sinus congestion, difficulty swallowing Pulmonary: Negative; No cough, wheezing, shortness of breath, hemoptysis Cardiovascular: Negative; No chest pain, presyncope, syncope, palpitations GI: Positive for GERD GU: Negative; No dysuria, hematuria, or difficulty voiding Musculoskeletal: Positive for back discomfort, status postlaminectomy and foramina ectomy July 2022. Hematologic/Oncology: Negative; no easy bruising, bleeding Endocrine: Negative; no heat/cold intolerance; no diabetes Neuro: Negative; no changes in balance, headaches Skin: Negative; No rashes or skin lesions Psychiatric: Positive for depression Sleep: Negative; No snoring, daytime sleepiness, hypersomnolence, bruxism, restless legs, hypnogognic hallucinations, no cataplexy Other comprehensive 14 point system review is negative.   PHYSICAL EXAM:   VS:  BP 132/69   Pulse (!) 53   Ht $R'5\' 11"'zD$  (1.803 m)   Wt 215 lb 3.2 oz (97.6 kg)   SpO2 99%   BMI 30.01 kg/m     Repeat blood pressure by me was 178/94  Wt Readings from Last 3 Encounters:  05/31/21 215 lb 3.2 oz (97.6 kg)  05/18/21 192 lb 10.9 oz (87.4 kg)  05/01/21 215 lb 12.8 oz (97.9 kg)    General: Alert, oriented, no distress.  Skin: normal turgor, no rashes, warm and dry HEENT: Normocephalic, atraumatic. Pupils equal round and reactive to light; sclera anicteric; extraocular  muscles intact;  Nose  without nasal septal hypertrophy Mouth/Parynx benign; Mallinpatti scale 3 Neck: No JVD, no carotid bruits; normal carotid upstroke Lungs: clear to ausculatation and percussion; no wheezing or rales Chest wall: without tenderness to palpitation Heart: PMI not displaced, RRR, s1 s2 normal, 1/6 systolic murmur, no diastolic murmur, no rubs, gallops, thrills, or heaves Abdomen: soft, nontender; no hepatosplenomehaly, BS+; abdominal aorta nontender and not dilated by palpation. Back: no CVA tenderness Pulses 2+ Musculoskeletal: full range of motion, normal strength, no joint deformities Extremities: no clubbing cyanosis or edema, Homan's sign negative  Neurologic: grossly nonfocal; Cranial nerves grossly wnl Psychologic: Normal mood and affect   Studies/Labs Reviewed:   May 31, 2021 ECG (independently read by me):  Sinus bradycardia at 53, normal intervals  May 01, 2021 ECG (independently read by me):  NSR at 82, nonspecific T changes, QTc 486 msec  Recent Labs: BMP Latest Ref Rng & Units 05/18/2021 05/01/2021 02/19/2021  Glucose 70 - 99 mg/dL 108(H) 89 123(H)  BUN 8 - 23 mg/dL $Remove'17 16 15  'MgsZHqr$ Creatinine 0.61 - 1.24 mg/dL 1.04 1.14 1.19  BUN/Creat Ratio 10 - 24 - 14 -  Sodium 135 - 145 mmol/L 138 144 137  Potassium 3.5 - 5.1 mmol/L 4.2 4.7 4.1  Chloride 98 - 111 mmol/L 104 99 106  CO2 22 - 32 mmol/L $RemoveB'25 24 24  'iylgreNy$ Calcium 8.9 - 10.3 mg/dL 9.0 9.6 8.7(L)     Hepatic Function Latest Ref Rng & Units 05/01/2021 09/26/2020 04/10/2020  Total Protein 6.0 - 8.5 g/dL 7.5 6.6 6.3  Albumin 3.8 - 4.8 g/dL 4.6 3.9 -  AST 0 - 40 IU/L $Remov'23 19 17  'PZHANg$ ALT 0 - 44 IU/L $Remov'20 17 15  'bgJzRt$ Alk Phosphatase 44 - 121 IU/L 113 56 -  Total Bilirubin 0.0 - 1.2 mg/dL 1.4(H) 0.5 0.8  Bilirubin, Direct 0.0 - 0.3 mg/dL - - -    CBC Latest Ref Rng & Units 05/19/2021 05/18/2021 05/01/2021  WBC 4.0 - 10.5 K/uL 7.2 8.7 10.6  Hemoglobin 13.0 - 17.0 g/dL 13.6 15.2 15.5  Hematocrit 39.0 - 52.0 % 40.4 43.9 44.8  Platelets 150 - 400  K/uL 140(L) 167 163   Lab Results  Component Value Date   MCV 98.3 05/19/2021   MCV 96.5 05/18/2021   MCV 96 05/01/2021   Lab Results  Component Value Date   TSH 1.010 05/01/2021   No results found for: HGBA1C   BNP No results found for: BNP  ProBNP No results found for: PROBNP   Lipid Panel     Component Value Date/Time   CHOL 184 05/01/2021 1207   TRIG 115 05/01/2021 1207   HDL 82 05/01/2021 1207   CHOLHDL 2.2 05/01/2021 1207   CHOLHDL 2.7 04/10/2020 1516   VLDL 17.8 07/31/2016 0759   LDLCALC 82 05/01/2021 1207   LDLCALC 62 04/10/2020 1516   LABVLDL 20 05/01/2021 1207     RADIOLOGY: CT Angio Chest Pulmonary Embolism (PE) W or WO Contrast  Addendum Date: 05/18/2021   ADDENDUM REPORT: 05/18/2021 11:27 ADDENDUM: Critical Value/emergent results were called by telephone at the time of interpretation on 05/18/2021 at 11:27 am to provider [facility] , who verbally acknowledged these results. Electronically Signed   By: Zetta Bills M.D.   On: 05/18/2021 11:27   Result Date: 05/18/2021 CLINICAL DATA:  Suspected pulmonary embolism in a 70 year old male with embolism discovered on cardiac evaluation. EXAM: CT ANGIOGRAPHY CHEST WITH CONTRAST TECHNIQUE: Multidetector CT imaging of the chest was performed  using the standard protocol during bolus administration of intravenous contrast. Multiplanar CT image reconstructions and MIPs were obtained to evaluate the vascular anatomy. CONTRAST:  54mL OMNIPAQUE IOHEXOL 350 MG/ML SOLN COMPARISON:  Comparison made with May 08, 2021. FINDINGS: Cardiovascular: RIGHT middle and lower lobe PE extending from the RIGHT pulmonary artery into lower lobe and middle lobe branches. Middle lobe emboli appear new compared to the study of May 08, 2021 RV to LV ratio of 0.91. Aorta is of normal caliber. Heart size is normal without substantial pericardial effusion. Calcified coronary artery disease as outlined in previous imaging.  Mediastinum/Nodes: No thoracic inlet, axillary, mediastinal or hilar adenopathy. Esophagus grossly normal. Lungs/Pleura: Basilar atelectasis. Small nodule at the RIGHT lung base 9 x 7 mm (image 79/10) more bandlike area of airspace disease in this location on the previous study. No effusion. Airways are patent. Upper Abdomen: Liver displays fissural widening and lobular/mildly nodular contours. No pericholecystic stranding. Imaged portions of pancreas are unremarkable. Spleen is suspected to be normal size but incompletely imaged. No acute gastrointestinal process in the upper abdomen. Musculoskeletal: No acute bone finding. No destructive bone process. Spinal degenerative changes. Review of the MIP images confirms the above findings. IMPRESSION: Positive for acute pulmonary embolism in RIGHT pulmonary arterial branches with top-normal RV to LV ratio may be an indicator of RIGHT heart strain. Middle lobe thrombus appears new compared to previous imaging. RV to LV ratio was similar on the previous imaging study. Correlate with any new or worsening symptoms and consider echocardiographic correlation for further assessment. Small nodule at the RIGHT lung base 9 x 7 mm. More bandlike area of airspace disease in this location on the previous study. This may represent a small pulmonary infarct mixed with atelectasis. Consider three-month follow-up to ensure resolution and exclude the possibility of underlying pulmonary nodule. Calcified coronary artery disease. Liver displays fissural widening and lobular/mildly nodular contours. Correlate with any clinical or laboratory evidence of liver disease. Critical Value/emergent results were called by telephone at the time of interpretation on 05/18/2021 at 11:21 am to provider Nurse Altha Harm, who verbally acknowledged these results. Aortic Atherosclerosis (ICD10-I70.0). Electronically Signed: By: Zetta Bills M.D. On: 05/18/2021 11:21   CT CORONARY FRACTIONAL FLOW RESERVE  DATA PREP  Result Date: 05/23/2021 CLINICAL DATA:  CAD EXAM: FFR CT TECHNIQUE: The patients best systolic and diastolic phases of gated cardiac CTA sent to HeartFlow for hemodynamic analysis FINDINGS: FFR CT normal in RCA and LCX and all there branches Positive in mid to distal LAD LAD: Proximal 0.96, mid 93 distal 0.77 IMPRESSION: Positive FFR CT in mid to distal LAD 0.77 Jenkins Rouge Electronically Signed   By: Jenkins Rouge M.D.   On: 05/09/2021 08:52   CT CORONARY FRACTIONAL FLOW RESERVE FLUID ANALYSIS  Result Date: 05/23/2021 CLINICAL DATA:  CAD EXAM: FFR CT TECHNIQUE: The patients best systolic and diastolic phases of gated cardiac CTA sent to HeartFlow for hemodynamic analysis FINDINGS: FFR CT normal in RCA and LCX and all there branches Positive in mid to distal LAD LAD: Proximal 0.96, mid 93 distal 0.77 IMPRESSION: Positive FFR CT in mid to distal LAD 0.77 Jenkins Rouge Electronically Signed   By: Jenkins Rouge M.D.   On: 05/09/2021 08:52   ECHOCARDIOGRAM COMPLETE  Result Date: 05/19/2021    ECHOCARDIOGRAM REPORT   Patient Name:   William Park Date of Exam: 05/19/2021 Medical Rec #:  [id number]             Height:  71.0 in Accession #:    [id number]            Weight:       192.7 lb Date of Birth:  [birth year]            BSA:          2.075 m Patient Age:    3 years              BP:           134/80 mmHg Patient Gender: M                     HR:           60 bpm. Exam Location:  Inpatient Procedure: 2D Echo, Cardiac Doppler and Color Doppler Indications:    Pulmonary Embolus I26.09  History:        Patient has no prior history of Echocardiogram examinations.  Sonographer:    Merrie Roof RDCS Referring Phys: [id number] [place]  1. Left ventricular ejection fraction, by estimation, is 60 to 65%. The left ventricle has normal function. The left ventricle has no regional wall motion abnormalities. Left ventricular diastolic parameters are indeterminate.  2. Right  ventricular systolic function is normal. The right ventricular size is normal. There is normal pulmonary artery systolic pressure.  3. Left atrial size was mildly dilated.  4. Right atrial size was mildly dilated.  5. The mitral valve is normal in structure. Trivial mitral valve regurgitation. No evidence of mitral stenosis.  6. The aortic valve is grossly normal. Aortic valve regurgitation is not visualized. No aortic stenosis is present.  7. The inferior vena cava is normal in size with greater than 50% respiratory variability, suggesting right atrial pressure of 3 mmHg. Comparison(s): No prior Echocardiogram. Conclusion(s)/Recommendation(s): Normal biventricular function without evidence of hemodynamically significant valvular heart disease. No evidence of RV enlargement, normal RV function. FINDINGS  Left Ventricle: Left ventricular ejection fraction, by estimation, is 60 to 65%. The left ventricle has normal function. The left ventricle has no regional wall motion abnormalities. The left ventricular internal cavity size was normal in size. There is  no left ventricular hypertrophy. Left ventricular diastolic parameters are indeterminate. Right Ventricle: The right ventricular size is normal. No increase in right ventricular wall thickness. Right ventricular systolic function is normal. There is normal pulmonary artery systolic pressure. The tricuspid regurgitant velocity is 2.44 m/s, and  with an assumed right atrial pressure of 3 mmHg, the estimated right ventricular systolic pressure is 56.8 mmHg. Left Atrium: Left atrial size was mildly dilated. Right Atrium: Right atrial size was mildly dilated. Pericardium: There is no evidence of pericardial effusion. Presence of pericardial fat pad. Mitral Valve: The mitral valve is normal in structure. Trivial mitral valve regurgitation. No evidence of mitral valve stenosis. Tricuspid Valve: The tricuspid valve is normal in structure. Tricuspid valve regurgitation is  trivial. No evidence of tricuspid stenosis. Aortic Valve: The aortic valve is grossly normal. Aortic valve regurgitation is not visualized. No aortic stenosis is present. Aortic valve mean gradient measures 3.0 mmHg. Aortic valve peak gradient measures 5.8 mmHg. Aortic valve area, by VTI measures 2.92 cm. Pulmonic Valve: The pulmonic valve was not well visualized. Pulmonic valve regurgitation is trivial. No evidence of pulmonic stenosis. Aorta: The aortic root, ascending aorta and aortic arch are all structurally normal, with no evidence of dilitation or obstruction. Venous: The inferior vena cava is normal in size with greater than 50% respiratory  variability, suggesting right atrial pressure of 3 mmHg. IAS/Shunts: No atrial level shunt detected by color flow Doppler.  LEFT VENTRICLE PLAX 2D LVIDd:         4.90 cm   Diastology LVIDs:         3.30 cm   LV e' medial:    6.53 cm/s LV PW:         0.90 cm   LV E/e' medial:  9.2 LV IVS:        0.90 cm   LV e' lateral:   11.70 cm/s LVOT diam:     2.00 cm   LV E/e' lateral: 5.2 LV SV:         74 LV SV Index:   36 LVOT Area:     3.14 cm  RIGHT VENTRICLE RV Basal diam:  3.30 cm LEFT ATRIUM             Index        RIGHT ATRIUM           Index LA diam:        4.80 cm 2.31 cm/m   RA Area:     16.30 cm LA Vol (A2C):   66.7 ml 32.14 ml/m  RA Volume:   42.20 ml  20.33 ml/m LA Vol (A4C):   59.5 ml 28.67 ml/m LA Biplane Vol: 63.0 ml 30.36 ml/m  AORTIC VALVE AV Area (Vmax):    2.88 cm AV Area (Vmean):   2.72 cm AV Area (VTI):     2.92 cm AV Vmax:           120.00 cm/s AV Vmean:          78.700 cm/s AV VTI:            0.254 m AV Peak Grad:      5.8 mmHg AV Mean Grad:      3.0 mmHg LVOT Vmax:         110.00 cm/s LVOT Vmean:        68.100 cm/s LVOT VTI:          0.236 m LVOT/AV VTI ratio: 0.93  AORTA Ao Root diam: 3.00 cm MITRAL VALVE               TRICUSPID VALVE MV Area (PHT): 3.99 cm    TR Peak grad:   23.8 mmHg MV Decel Time: 190 msec    TR Vmax:        244.00 cm/s MV E  velocity: 60.40 cm/s MV A velocity: 80.60 cm/s  SHUNTS MV E/A ratio:  0.75        Systemic VTI:  0.24 m                            Systemic Diam: 2.00 cm Buford Dresser MD Electronically signed by Buford Dresser MD Signature Date/Time: 05/19/2021/12:02:12 PM    Final    VAS [place] LOWER EXTREMITY VENOUS (DVT)  Result Date: 05/20/2021  Lower Venous DVT Study Patient Name:  William Park  Date of Exam:   05/19/2021 Medical Rec #: [id number]              Accession #:    [id number] Date of Birth: 1951-03-17             Patient Gender: M Patient Age:   52 years Exam Location[facility]  [facility] Procedure:      VAS [place] LOWER EXTREMITY  VENOUS (DVT) Referring Phys: DAVID ORTIZ --------------------------------------------------------------------------------  Indications: Pulmonary embolism.  Risk Factors: Confirmed PE. Anticoagulation: Heparin. Comparison Study: no prior Performing Technologist: Antonieta Pert RDMS, RVT  Examination Guidelines: A complete evaluation includes B-mode imaging, spectral Doppler, color Doppler, and power Doppler as needed of all accessible portions of each vessel. Bilateral testing is considered an integral part of a complete examination. Limited examinations for reoccurring indications may be performed as noted. The reflux portion of the exam is performed with the patient in reverse Trendelenburg.  +---------+---------------+---------+-----------+----------+--------------+ RIGHT    CompressibilityPhasicitySpontaneityPropertiesThrombus Aging +---------+---------------+---------+-----------+----------+--------------+ CFV      Full           Yes      Yes                                 +---------+---------------+---------+-----------+----------+--------------+ SFJ      Full                                                        +---------+---------------+---------+-----------+----------+--------------+ FV Prox  Full                                                         +---------+---------------+---------+-----------+----------+--------------+ FV Mid   Full                                                        +---------+---------------+---------+-----------+----------+--------------+ FV DistalFull                                                        +---------+---------------+---------+-----------+----------+--------------+ PFV      Full                                                        +---------+---------------+---------+-----------+----------+--------------+ POP      Full           Yes      Yes                                 +---------+---------------+---------+-----------+----------+--------------+ PTV      Full                                                        +---------+---------------+---------+-----------+----------+--------------+ PERO     Full                                                        +---------+---------------+---------+-----------+----------+--------------+   +---------+---------------+---------+-----------+----------+--------------+  LEFT     CompressibilityPhasicitySpontaneityPropertiesThrombus Aging +---------+---------------+---------+-----------+----------+--------------+ CFV      Full           Yes      Yes                                 +---------+---------------+---------+-----------+----------+--------------+ SFJ      Full                                                        +---------+---------------+---------+-----------+----------+--------------+ FV Prox  Full                                                        +---------+---------------+---------+-----------+----------+--------------+ FV Mid   Full                                                        +---------+---------------+---------+-----------+----------+--------------+ FV DistalFull                                                         +---------+---------------+---------+-----------+----------+--------------+ PFV      Full                                                        +---------+---------------+---------+-----------+----------+--------------+ POP      Full           Yes      Yes                                 +---------+---------------+---------+-----------+----------+--------------+ PTV      None                               dilated   Acute          +---------+---------------+---------+-----------+----------+--------------+ PERO     Full                                                        +---------+---------------+---------+-----------+----------+--------------+     Summary: RIGHT: - There is no evidence of deep vein thrombosis in the lower extremity.  - No cystic structure found in the popliteal fossa.  LEFT: - Findings consistent with acute deep vein thrombosis involving the left posterior tibial veins. - No cystic structure found in the popliteal fossa.  *See table(s) above for  measurements and observations. Electronically signed by Deitra Mayo MD on 05/20/2021 at 6:26:59 AM.    Final      Additional studies/ records that were reviewed today include:  I reviewed the records of Dr. Garret Reddish.  I did not have records available from his prior angioplasties between 1990 and 1994.  ECHO: 05/19/2021 Left ventricular ejection fraction, by estimation, is 60 to 65%. The left ventricle has normal function. The left ventricle has no regional wall motion abnormalities. Left ventricular diastolic parameters are indeterminate. 1. Right ventricular systolic function is normal. The right ventricular size is normal. There is normal pulmonary artery systolic pressure. 2. 3. Left atrial size was mildly dilated. 4. Right atrial size was mildly dilated. The mitral valve is normal in structure. Trivial mitral valve regurgitation. No evidence of mitral stenosis. 5. The aortic valve is  grossly normal. Aortic valve regurgitation is not visualized. No aortic stenosis is present. 6. The inferior vena cava is normal in size with greater than 50% respiratory variability, suggesting right atrial pressure of 3 mmHg.  ASSESSMENT:    1. Pulmonary embolism, other, unspecified chronicity, unspecified whether acute cor pulmonale present ([facility])   2. Deep vein thrombosis (DVT) of calf muscle vein of right lower extremity [facility]): October 2022   3. Anticoagulated   4. Coronary artery disease involving native coronary artery of native heart without angina pectoris   5. History of PTCAs: [place]   6. Elevated coronary artery calcium score   7. Hyperlipidemia with target LDL less than 70   8. History of depression     PLAN:  William Park is a 70 year old gentleman who has a history of prior CAD and apparently was told of having 3 blockages in 1990 for which he had undergone percutaneous coronary angioplasty procedures.  His last catheterization was in 1994.  Subsequently, he had lost his insurance and had subsequent follow-up cardiology evaluations by [name] in [facility] and approximately 2 years ago underwent routine treadmill testing and was told that this was apparently "okay."  Over the past 10 months, he has started to notice a change with development of exertional shortness of breath with less activity.  He denies associated chest tightness or pressure.  He has had issues with back discomfort and is status postlaminectomy and also has had some right leg discomfort.  He has had recent increase in blood pressure requiring titration of medication.  At my initial evaluation on October 4, medications were titrated and amlodipine was started at 5 mg.  I scheduled him to undergo a coronary CTA and on this study he was found to have elevated calcium score 1198 representing 87th percentile for age and sex.  Incidentally, however he was found to have right lower lobe  pulmonary arterial filling defect consistent with PE on this nondedicated exam.  Pulmonary embolism was confirmed on a CT angio of his chest and he also was noted to have new middle lobe thrombus addition to his right pulmonary Curiel branches top normal RV to LV ratio suggesting possible right heart strain.  He has now been on Eliquis anticoagulation.  His breathing is improved.  It was recommended that a 63-month follow-up CT angio of his chest for PE be obtained to ensure resolution and exclude the possibility of underlying pulmonary nodules.  Presently, blood pressure remains slightly elevated.  I have recommended further titration of amlodipine to 5 7.5 mg and he continues to be on losartan 50 mg daily and  metoprolol tartrate 12.5 mg twice a day.  Recent lipid panel showed an LDL cholesterol at 82 despite being on atorvastatin 80 mg.  I have recommended the addition of Zetia 10 mg.  He is not having any bleeding and is tolerating Eliquis 5 mg twice a day.  In late January he will undergo his follow-up CT angio of his chest and repeat fasting laboratory will be obtained.  I will see him in follow-up in February 2023 for further evaluation and treatment.  Medication Adjustments/Labs and Tests Ordered: Current medicines are reviewed at length with the patient today.  Concerns regarding medicines are outlined above.  Medication changes, Labs and Tests ordered today are listed in the Patient Instructions below. Patient Instructions  Medication Instructions:  Start taking ezetimibe (Zetia) 10 mg each day.  Take amlodipine 7.5 mg (one and one-half tablets) each day.  *If you need a refill on your cardiac medications before your next appointment, please call your pharmacy*   Testing/Procedures: CT of the chest at [facility]. Schedule in late January or February.   Follow-Up: At [facility], you and your health needs are our priority.  As part of our continuing mission to provide you with exceptional  heart care, we have created designated Provider Care Teams.  These Care Teams include your primary Cardiologist (physician) and Advanced Practice Providers (APPs -  Physician Assistants and Nurse Practitioners) who all work together to provide you with the care you need, when you need it.  We recommend signing up for the patient portal called "MyChart".  Sign up information is provided on this After Visit Summary.  MyChart is used to connect with patients for Virtual Visits (Telemedicine).  Patients are able to view lab/test results, encounter notes, upcoming appointments, etc.  Non-urgent messages can be sent to your provider as well.   To learn more about what you can do with MyChart, go to [email or web address].    Your next appointment:   February 2023   The format for your next appointment:   In Person  Provider:   Shelva Majestic, MD  To [facility]: Mr. Dema Park will be able to return to physical therapy starting in December 2022.    Signed, Shelva Majestic, MD  06/07/2021 5:59 PM    [facility] [address] [place], [place], [place]  [address] Phone: [phone]

## 2021-05-31 NOTE — Patient Instructions (Addendum)
Medication Instructions:  Start taking ezetimibe (Zetia) 10 mg each day.  Take amlodipine 7.5 mg (one and one-half tablets) each day.  *If you need a refill on your cardiac medications before your next appointment, please call your pharmacy*   Testing/Procedures: CT [facility] the chest at [facility]. Schedule in late January or February.   Follow-Up: At [facility], you and your health needs are our priority.  As part of our continuing mission to provide you with exceptional heart care, we have created designated Provider Care Teams.  These Care Teams include your primary Cardiologist (physician) and Advanced Practice Providers (APPs -  Physician Assistants and Nurse Practitioners) who all work together to provide you with the care you need, when you need it.  We recommend signing up for the patient portal called "MyChart".  Sign up information is provided on this After Visit Summary.  MyChart is used to connect with patients for Virtual Visits (Telemedicine).  Patients are able to view lab/test results, encounter notes, upcoming appointments, etc.  Non-urgent messages can be sent to your provider as well.   To learn more about what you can do with MyChart, go to [email or web address].    Your next appointment:   February 2023   The format for your next appointment:   In Person  Provider:   Shelva Majestic, MD  To [facility]: Mr. Dema Severin will be able to return to physical therapy starting in December 2022.

## 2021-06-07 ENCOUNTER — Encounter: Payer: Self-pay | Admitting: Cardiovascular Disease

## 2021-06-13 ENCOUNTER — Telehealth: Payer: Self-pay | Admitting: *Deleted

## 2021-06-13 NOTE — Telephone Encounter (Signed)
-----   Message from April D Calhoun sent at 06/13/2021  2:04 PM EST ----- Regarding: Rx refill Patient is needing an Rx refill for Eliquis sent to [facility] for an upcoming delivery tomorrow.  Thank you,  April D Calhoun, [place] Pharmacist Assistant [phone]

## 2021-06-14 ENCOUNTER — Telehealth: Payer: Self-pay | Admitting: Cardiovascular Disease

## 2021-06-14 MED ORDER — APIXABAN 5 MG PO TABS: ORAL_TABLET | ORAL | 0 refills | Status: DC

## 2021-06-14 NOTE — Telephone Encounter (Signed)
Spoke with pt who reports Eliquis fill is $480 and he cannot afford this amount.  Pt states he contacted [employer] and was advised he does not qualify for assistance due to having part D Medicare.  Pt does have a few days left of is free 30day fill.  Pt will have to consider another medication. Pt advised will forward information for further assistance.  Pt verbalizes understanding and agrees with current plan.

## 2021-06-14 NOTE — Addendum Note (Signed)
Addended by: Clyde Lundborg A on: 06/14/2021 07:52 AM   Modules accepted: Orders

## 2021-06-14 NOTE — Telephone Encounter (Signed)
[facility]

## 2021-06-14 NOTE — Telephone Encounter (Signed)
Pt c/o medication issue:  1. Name of Medication: ELIQUIS 5 MG  2. How are you currently taking this medication (dosage and times per day)? AS DIRECTED  3. Are you having a reaction (difficulty breathing--STAT)? NO  4. What is your medication issue? PT WAS PRESCRIBED THIS MEDICINE IN [place] RECENTLY 05/18/21, HE WANTS TO KNOW IF THERE IS ANY OTHER MEDICINE HE CAN TAKE BECAUSE THIS MED COST $480.00 PER EVERY 30 DAYS

## 2021-06-18 NOTE — Telephone Encounter (Signed)
Called patient,. He will try again and fill out patient assistance forms.   Mychart message sent to patient with this information- advised him to let me know if he needed samples.

## 2021-06-18 NOTE — Telephone Encounter (Signed)
Being on Medicare does not mean he will not qualify for patient assistance.  Recommend he fill out patient assistance form at:  [email or web address]

## 2021-06-19 ENCOUNTER — Telehealth: Payer: Self-pay | Admitting: *Deleted

## 2021-06-19 DIAGNOSIS — M79604 Pain in right leg: Secondary | ICD-10-CM | POA: Diagnosis not present

## 2021-06-19 NOTE — Telephone Encounter (Signed)
Patient was diagnosed with an acute PE in 04/2021. Plan is for repeat chest CTA on 08/28/2021 for follow-up of this. We will need to wait for results of this before providing final recommendations.   Pre-op covering staff, can you please notify requesting office?  Thank you!

## 2021-06-19 NOTE — Telephone Encounter (Signed)
   [facility] Pre-operative Risk Assessment    Patient Name: Brandt Chaney III  DOB: 1951-01-12 MRN: [id number]  HEARTCARE STAFF:  - IMPORTANT!!!!!! Under Visit Info/Reason for Call, type in Other and utilize the format Clearance MM/DD/YY or Clearance TBD. Do not use dashes or single digits. - Please review there is not already an duplicate clearance open for this procedure. - If request is for dental extraction, please clarify the # of teeth to be extracted. - If the patient is currently at the dentist's office, call Pre-Op Callback Staff (MA/nurse) to input urgent request.  - If the patient is not currently in the dentist office, please route to the Pre-Op pool.  Request for surgical clearance:  What type of surgery is being performed? Spinal injection  When is this surgery scheduled? TBD  What type of clearance is required (medical clearance vs. Pharmacy clearance to hold med vs. Both)? both  Are there any medications that need to be held prior to surgery and how long? Eliquis for 3-5 days  Practice name and name of physician performing surgery? [facility]   What is the office phone number? [phone]   7.   What is the office fax number? [phone]  8.   Anesthesia type (None, local, MAC, general) ? Not listed   Fredia Beets 06/19/2021, 3:57 PM  _________________________________________________________________   (provider comments below)

## 2021-06-20 NOTE — Telephone Encounter (Signed)
Left detailed message on administrative assistants voicemail with Callie's recommendations. Advised a call back with any questions or concerns.

## 2021-06-27 ENCOUNTER — Telehealth: Payer: Self-pay | Admitting: Pharmacist

## 2021-06-27 ENCOUNTER — Other Ambulatory Visit (HOSPITAL_COMMUNITY): Payer: Medicare Other

## 2021-06-27 NOTE — Chronic Care Management (AMB) (Signed)
    Chronic Care Management Pharmacy Assistant   Name: William Park  MRN: [id number] DOB: [birth year]   Reason for Encounter: Schedule Medicare Annual Wellness Visit   Patient scheduled his medicare annual wellness exam for 08/20/2020 at 9:30 am.  Future Appointments  Date Time Provider [place]  08/20/2021  9:30 AM [facility] HE[facility] COACH [facility]  08/28/2021  9:00 AM [facility] 2 [facility] [place]  09/18/2021  1:30 PM Troy Sine, MD [facility]  10/08/2021  1:00 PM Marin Olp, MD [facility] [place]    April D Calhoun, [place] Pharmacist Assistant [phone]

## 2021-07-02 DIAGNOSIS — L570 Actinic keratosis: Secondary | ICD-10-CM | POA: Diagnosis not present

## 2021-07-03 DIAGNOSIS — F322 Major depressive disorder, single episode, severe without psychotic features: Secondary | ICD-10-CM | POA: Diagnosis not present

## 2021-07-03 DIAGNOSIS — F321 Major depressive disorder, single episode, moderate: Secondary | ICD-10-CM | POA: Diagnosis not present

## 2021-07-03 NOTE — Telephone Encounter (Signed)
Pt dx with DVT and PE 05/18/21. Planning for 3 month follow up CT chest angio to ensure resolution of PE.   We do not have his prescription insurance scanned into Epic so I'm unable to see what his copay is. If he was quoted $480, that sounds like a 1 time deductible. Looks like Eliquis was his first branded med so he likely hadn't paid through the deductible previously. Would see if he's changing insurance plans next year to one with a lower or no deductible. Typical Eliquis copay is $45/month for Medicare Part D plans.   We are also not prescribing his Eliquis, his PCP is since it's a noncardiac indication. Ideally would keep him on Eliquis especially if it's discontinued at 3 month follow up pending chest CT results, otherwise may need to consider warfarin. Ideally PCP would manage due to non-cardiac indication.

## 2021-07-03 NOTE — Telephone Encounter (Signed)
Received notification from [facility] that patient does not qualify for patient assistance for not having documentation of 3% out of pocket prescription expenses based on household adjusted gross income, not met.   Patient has tried before and did not get approved, this is the second try and same result.   Will forward to [facility] to make aware that he can no afford his Eliquis, and will send to pharmacy to notify them of this information as well for an update.   Thanks!

## 2021-07-04 ENCOUNTER — Other Ambulatory Visit: Payer: Self-pay

## 2021-07-04 ENCOUNTER — Telehealth: Payer: Self-pay | Admitting: Pharmacist

## 2021-07-04 MED ORDER — ATORVASTATIN CALCIUM 80 MG PO TABS: 80.0000 mg | ORAL_TABLET | Freq: Every day | ORAL | 3 refills | Status: DC

## 2021-07-04 MED ORDER — FAMOTIDINE 20 MG PO TABS: 20.0000 mg | ORAL_TABLET | Freq: Two times a day (BID) | ORAL | 3 refills | Status: DC

## 2021-07-04 MED ORDER — OMEPRAZOLE 40 MG PO CPDR
40.0000 mg | DELAYED_RELEASE_CAPSULE | Freq: Every day | ORAL | 3 refills | Status: DC
Start: 2021-07-04 — End: 2022-07-03

## 2021-07-04 MED ORDER — METOPROLOL TARTRATE 25 MG PO TABS: 12.5000 mg | ORAL_TABLET | Freq: Two times a day (BID) | ORAL | 3 refills | Status: DC

## 2021-07-04 NOTE — Chronic Care Management (AMB) (Addendum)
Chronic Care Management Pharmacy Assistant   Name: William Park  MRN: [id number] DOB: [birth year]   Reason for Encounter: Medication Coordination Call    Recent office visits:  04/26/2021 Patient Message (PCP) Marin Olp, MD; We could have you take an additional losartan in the evening-so take 50 mg twice daily.  Update [place] on Monday with how your blood pressure is doing-you may need additional support.  04/09/2021 OV (PCP) Marin Olp, MD; no medication changes indicated.  Recent consult visits:  06/19/2021 OV ([place]) Arnoldo Morale MD, Leonie Douglas, no further information available.  05/31/2021 OV (Cardiology) Troy Sine, MD; Start taking ezetimibe (Zetia) 10 mg each day. Take amlodipine 7.5 mg (one and one-half tablets) each day.  05/01/2021 OV (Cardiology) Troy Sine, MD; Take Metoprolol 100 mg before CT when it gets scheduled.  Amlodipine 5 mg daily   Hospital visits:  05/18/2021 ED to Hospital Admission for Acute Pulmonary Embolism  Admit date: 05/18/2021 Discharge date: 05/20/2021  New medication: Eliquis 10 mg p.o. twice daily x6 days, then decrease to 5 mg p.o. twice daily for the next 6 months While on Eliquis, patient will not take aspirin, Aleve or Mobic New medication: Metoprolol 12.5 mg p.o. twice daily Medication change: Simvastatin discontinued New medication: Atorvastatin 80 mg p.o. daily  Medications: Outpatient Encounter Medications as of 07/04/2021  Medication Sig   acetaminophen (TYLENOL) 500 MG tablet Take 1,000 mg by mouth every 6 (six) hours as needed for moderate pain.   amLODipine (NORVASC) 5 MG tablet Take 1.5 tablets (7.5 mg total) by mouth daily.   apixaban (ELIQUIS) 5 MG TABS tablet 2 tabs (10mg ) po BID x 6 days, then 1 tab po bid   atorvastatin (LIPITOR) 80 MG tablet Take 1 tablet (80 mg total) by mouth daily.   busPIRone (BUSPAR) 5 MG tablet Take 1 tablet (5 mg total) by mouth 3  (three) times daily as needed.   docusate sodium (COLACE) 100 MG capsule Take 1 capsule (100 mg total) by mouth 2 (two) times daily. (Patient taking differently: Take 100 mg by mouth 2 (two) times daily as needed for mild constipation.)   ezetimibe (ZETIA) 10 MG tablet Take 1 tablet (10 mg total) by mouth daily.   famotidine (PEPCID) 20 MG tablet Take 1 tablet (20 mg total) by mouth 2 (two) times daily.   fluticasone (FLONASE) 50 MCG/ACT nasal spray Place 2 sprays into both nostrils daily as needed for allergies or rhinitis.   gabapentin (NEURONTIN) 300 MG capsule Take 300 mg by mouth 3 (three) times daily.   losartan (COZAAR) 50 MG tablet TAKE ONE TABLET BY MOUTH ONCE DAILY (Patient taking differently: 50 mg in the morning and at bedtime.)   metoprolol tartrate (LOPRESSOR) 25 MG tablet Take 0.5 tablets (12.5 mg total) by mouth 2 (two) times daily.   omeprazole (PRILOSEC) 40 MG capsule Take 1 capsule (40 mg total) by mouth daily. [place].   sildenafil (VIAGRA) 100 MG tablet Take 1 tablet (100 mg total) by mouth daily as needed for erectile dysfunction.   No facility-administered encounter medications on file as of 07/04/2021.    BP Readings from Last 3 Encounters:  05/31/21 132/69  05/20/21 133/80  05/08/21 121/78    No results found for: HGBA1C   Patient obtains medications through Vials  90 Days   Patient is due for next adherence delivery on: 07/16/2021. Called patient and reviewed medications and coordinated delivery.  This  delivery to include: Amlodipine 5 mg 1.5 tablet daily Ezetimibe 10 mg once a day Omeprazole 40 mg once a day Losartan Potassium 50 mg once a day Famotidine 20 mg twice daily Buspirone 5 mg three times daily as needed Metoprolol Tartrate 12.5 mg twice daily Atorvastatin 80 mg once a day  Patient declined the following medications (meds) due to (reason) Meloxicam 15 mg - medication has been discontinued  Patient needs refills  for: Omeprazole 40 mg Famotidine 20 mg Metoprolol 25 mg Atorvastatin 80 mg -Request sent to Team [facility] requesting refills.  Confirmed delivery date of 07/16/2021, advised patient that pharmacy will contact them the morning of delivery.   April D Calhoun, [place] Pharmacist Assistant [phone]   7 minutes spent in review, coordination, and documentation.  Reviewed by: Beverly Milch, PharmD Clinical Pharmacist [phone]

## 2021-07-06 ENCOUNTER — Other Ambulatory Visit: Payer: Self-pay

## 2021-07-06 DIAGNOSIS — E785 Hyperlipidemia, unspecified: Secondary | ICD-10-CM

## 2021-07-06 DIAGNOSIS — I2510 Atherosclerotic heart disease of native coronary artery without angina pectoris: Secondary | ICD-10-CM

## 2021-07-06 DIAGNOSIS — I10 Essential (primary) hypertension: Secondary | ICD-10-CM

## 2021-07-06 NOTE — Telephone Encounter (Signed)
FYI, We have 5mg  samples #14 but can only give one box.

## 2021-07-06 NOTE — Telephone Encounter (Signed)
I have never seen him for this issue.  Team please get him in for hospital follow-up.  Do we have any Eliquis samples that we can get him to at least get him through this year-can we set him up with C[facility] to see if they can help with medication costs or maybe even finding a plan that will cover this better?

## 2021-07-06 NOTE — Telephone Encounter (Signed)
Please reserve those for him/get for him. Lets get him in next week- definitely before he runs out to get a plan together

## 2021-07-09 NOTE — Telephone Encounter (Signed)
Please schedule ov for pt this week.

## 2021-07-09 NOTE — Telephone Encounter (Signed)
Please advise if okay to double book a slot to work patient in. No available appointments until next year.

## 2021-07-09 NOTE — Telephone Encounter (Signed)
LVM for patient to schedule for 12/13/ at 10am.

## 2021-07-09 NOTE — Telephone Encounter (Signed)
See below

## 2021-07-09 NOTE — Telephone Encounter (Signed)
[place] thanks

## 2021-07-10 ENCOUNTER — Ambulatory Visit (INDEPENDENT_AMBULATORY_CARE_PROVIDER_SITE_OTHER): Payer: Medicare Other | Admitting: Family Medicine

## 2021-07-10 ENCOUNTER — Encounter: Payer: Self-pay | Admitting: Family Medicine

## 2021-07-10 ENCOUNTER — Other Ambulatory Visit: Payer: Self-pay

## 2021-07-10 VITALS — BP 102/62 | HR 54 | Temp 98.4°F | Ht 71.0 in | Wt 221.2 lb

## 2021-07-10 DIAGNOSIS — I10 Essential (primary) hypertension: Secondary | ICD-10-CM

## 2021-07-10 DIAGNOSIS — I824Z2 Acute embolism and thrombosis of unspecified deep veins of left distal lower extremity: Secondary | ICD-10-CM | POA: Diagnosis not present

## 2021-07-10 DIAGNOSIS — K219 Gastro-esophageal reflux disease without esophagitis: Secondary | ICD-10-CM

## 2021-07-10 DIAGNOSIS — E782 Mixed hyperlipidemia: Secondary | ICD-10-CM

## 2021-07-10 DIAGNOSIS — I2699 Other pulmonary embolism without acute cor pulmonale: Secondary | ICD-10-CM | POA: Diagnosis not present

## 2021-07-10 DIAGNOSIS — R931 Abnormal findings on diagnostic imaging of heart and coronary circulation: Secondary | ICD-10-CM

## 2021-07-10 DIAGNOSIS — Z86718 Personal history of other venous thrombosis and embolism: Secondary | ICD-10-CM | POA: Insufficient documentation

## 2021-07-10 LAB — COMPREHENSIVE METABOLIC PANEL
ALT: 24 U/L (ref 0–53)
AST: 24 U/L (ref 0–37)
Albumin: 3.7 g/dL (ref 3.5–5.2)
Alkaline Phosphatase: 78 U/L (ref 39–117)
BUN: 16 mg/dL (ref 6–23)
CO2: 31 mEq/L (ref 19–32)
Calcium: 9 mg/dL (ref 8.4–10.5)
Chloride: 105 mEq/L (ref 96–112)
Creatinine, Ser: 1.13 mg/dL (ref 0.40–1.50)
GFR: 65.62 mL/min (ref >60.00)
Glucose, Bld: 106 mg/dL — ABNORMAL HIGH (ref 70–99)
Potassium: 3.9 mEq/L (ref 3.5–5.1)
Sodium: 142 mEq/L (ref 135–145)
Total Bilirubin: 0.9 mg/dL (ref 0.2–1.2)
Total Protein: 6.3 g/dL (ref 6.0–8.3)

## 2021-07-10 LAB — LIPID PANEL
Cholesterol: 95 mg/dL (ref 0–200)
HDL: 41.6 mg/dL (ref >39.00)
LDL Cholesterol: 36 mg/dL (ref 0–99)
NonHDL: 53.62
Total CHOL/HDL Ratio: 2
Triglycerides: 88 mg/dL (ref 0.0–149.0)
VLDL: 17.6 mg/dL (ref 0.0–40.0)

## 2021-07-10 LAB — CBC WITH DIFFERENTIAL/PLATELET
Basophils Absolute: 0 10*3/uL (ref 0.0–0.1)
Basophils Relative: 0.4 % (ref 0.0–3.0)
Eosinophils Absolute: 0.1 10*3/uL (ref 0.0–0.7)
Eosinophils Relative: 1.6 % (ref 0.0–5.0)
HCT: 37 % — ABNORMAL LOW (ref 39.0–52.0)
Hemoglobin: 12.3 g/dL — ABNORMAL LOW (ref 13.0–17.0)
Lymphocytes Relative: 40.5 % (ref 12.0–46.0)
Lymphs Abs: 2.3 10*3/uL (ref 0.7–4.0)
MCHC: 33.3 g/dL (ref 30.0–36.0)
MCV: 97.8 fl (ref 78.0–100.0)
Monocytes Absolute: 0.5 10*3/uL (ref 0.1–1.0)
Monocytes Relative: 8.3 % (ref 3.0–12.0)
Neutro Abs: 2.8 10*3/uL (ref 1.4–7.7)
Neutrophils Relative %: 49.2 % (ref 43.0–77.0)
Platelets: 196 10*3/uL (ref 150.0–400.0)
RBC: 3.79 Mil/uL — ABNORMAL LOW (ref 4.22–5.81)
RDW: 13.5 % (ref 11.5–15.5)
WBC: 5.6 10*3/uL (ref 4.0–10.5)

## 2021-07-10 MED ORDER — APIXABAN 5 MG PO TABS: 5.0000 mg | ORAL_TABLET | Freq: Two times a day (BID) | ORAL | 4 refills | Status: DC

## 2021-07-10 NOTE — Progress Notes (Signed)
Phone [phone] In person visit   Subjective:   William Park is a 70 y.o. year old very pleasant male patient who presents for/with See problem oriented charting Chief Complaint  Patient presents with   Follow-up    Discuss Eliquis    This visit occurred during the SARS-CoV-2 public health emergency.  Safety protocols were in place, including screening questions prior to the visit, additional usage of staff PPE, and extensive cleaning of exam room while observing appropriate contact time as indicated for disinfecting solutions.   Past Medical History-  Patient Active Problem List   Diagnosis Date Noted   Deep vein thrombosis (DVT) of distal vein of left lower extremity ([place]) 07/10/2021    Priority: High   Pulmonary embolism ([place]) 05/18/2021    Priority: High   CAD (coronary artery disease)     Priority: High   Hyperglycemia 05/18/2021    Priority: Medium    Alcohol use 05/18/2021    Priority: Medium    Major depressive disorder with single episode, in full remission ([place]) 05/12/2018    Priority: Medium    Hyperlipidemia     Priority: Medium    Hypertension     Priority: Medium    GERD (gastroesophageal reflux disease)     Priority: Medium    Osteoarthritis     Priority: Medium    Former smoker 01/06/2015    Priority: Low   Allergic rhinitis     Priority: Low   Overweight (BMI 25.0-29.9) 05/19/2021   Spinal stenosis of lumbar region with neurogenic claudication 02/19/2021   Peroneal tendinitis, right 05/18/2018   Anemia 08/13/2016    Medications- reviewed and updated Current Outpatient Medications  Medication Sig Dispense Refill   acetaminophen (TYLENOL) 500 MG tablet Take 1,000 mg by mouth every 6 (six) hours as needed for moderate pain.     amLODipine (NORVASC) 5 MG tablet Take 1.5 tablets (7.5 mg total) by mouth daily. 135 tablet 3   atorvastatin (LIPITOR) 80 MG tablet Take 1 tablet (80 mg total) by mouth daily. 90 tablet 3   busPIRone (BUSPAR) 5  MG tablet Take 1 tablet (5 mg total) by mouth 3 (three) times daily as needed. 270 tablet 3   docusate sodium (COLACE) 100 MG capsule Take 1 capsule (100 mg total) by mouth 2 (two) times daily. (Patient taking differently: Take 100 mg by mouth 2 (two) times daily as needed for mild constipation.) 10 capsule 0   ezetimibe (ZETIA) 10 MG tablet Take 1 tablet (10 mg total) by mouth daily. 90 tablet 3   famotidine (PEPCID) 20 MG tablet Take 1 tablet (20 mg total) by mouth 2 (two) times daily. 180 tablet 3   fluticasone (FLONASE) 50 MCG/ACT nasal spray Place 2 sprays into both nostrils daily as needed for allergies or rhinitis.     gabapentin (NEURONTIN) 300 MG capsule Take 300 mg by mouth 3 (three) times daily.     losartan (COZAAR) 50 MG tablet TAKE ONE TABLET BY MOUTH ONCE DAILY (Patient taking differently: 50 mg in the morning and at bedtime.) 90 tablet 1   metoprolol tartrate (LOPRESSOR) 25 MG tablet Take 0.5 tablets (12.5 mg total) by mouth 2 (two) times daily. 180 tablet 3   omeprazole (PRILOSEC) 40 MG capsule Take 1 capsule (40 mg total) by mouth daily. White. 90 capsule 3   sildenafil (VIAGRA) 100 MG tablet Take 1 tablet (100 mg total) by mouth daily as needed for erectile dysfunction. 100 tablet 11  apixaban (ELIQUIS) 5 MG TABS tablet Take 1 tablet (5 mg total) by mouth 2 (two) times daily. 60 tablet 4   No current facility-administered medications for this visit.     Objective:  BP 102/62   Pulse (!) 54   Temp 98.4 F (36.9 C)   Ht 5\' 11"  (1.803 m)   Wt 221 lb 3.2 oz (100.3 kg)   SpO2 96%   BMI 30.85 kg/m  Gen: NAD, resting comfortably CV: mildly bradycardic no murmurs rubs or gallops Lungs: CTAB no crackles, wheeze, rhonchi Abdomen: soft/nontender/nondistended/normal bowel sounds. No rebound or guarding.  Ext: trace edema- no calf tenderness Skin: warm, dry    Assessment and Plan  # ED F/U for Pulmonary Embolism/DVT S:medication: Eliquis 5 mg twice  daily  Patient presented to the ED on 05/18/2021 after referral from cardiology after an outpatient CTA (due to worsening shortness of breath outpatient after being off of his feet for extended period with back surgery dated back in July- revealed acute PE and right pulmonary arterial branches with top normal RV to LV radio. A small pulmonary nodule was also noted in the right base.  CT Angio Chest PE was ordered on 05/18/2021 by Dr. [name] from cardiology and showed patient was positive for acute pulmonary embolism in RIGHT pulmonary arterial branches with top-normal RV to LV ratio may be an indicator of RIGHT heart strain, middle lobe thrombus appeared new compared to previous imaging. RV to LV ratio was similar on the previous imaging study. - Small nodule at the RIGHT lung base 9 x 7 mm. More bandlike area of airspace disease in this location on the previous study. This may represent a small pulmonary infarct mixed with atelectasis. Consider three-month follow-up to ensure resolution and exclude the possibility of underlying pulmonary nodule. Calcified coronary artery disease. Liver displays fissural widening and lobular/mildly nodular contours.  A left-sided DVT was also found on lower extremity venous Doppler study.   Patient washospitalized and started on heparin infusion l.  He was told to hold meloxicam and naproxen but continue PPI for GERD.  Patient was then later transitioned to  Eliquis 10 mg twice daily for 6 days and then to decrease to 5 mg twice daily over the next 6 months. He was to not take aspirin, Aleve or Mobic while on Eliquis. Also started on metoprolol 12.5 mg twice daily. Simvastatin was discontinued and patient was placed on atorvastatin 80 mg daily to improve lipid control. Patient was discharged on 05/20/2021 after admission on 05/18/2021  Patient was also seen by Dr. [name] at cardiology on 05/31/2021 and plan was for titration on amlodipine from 5 mg to 7.5 mg to help with  blood pressure- continue losartan 50 mg daily onlyand metoprolol 12.5 mg BID. Recent lipid panel showed an LDL cholestrol at 82  - patient was to continue atorvastatin 80 mg and add in Zetia 10 mg. Plan is for in late January patient will undergo a follow-up CT angio chset. Follow-up in February 2023 for further evaluation and treatment.   Today, patient reports issues with affording Eliquis-and reports shortness of breath persists but improving some. No chest pain with it.   Despite being over $400 and medications out-of-pocket this year he is being rejected by his insurance and being told he needs to spend over this amount-he even documented this based off paperwork from June 18, 2021 MyChart visit-he would have to spend $480 for the full prescription in December and then again in January before paying  17% of the $480 and this is simply not affordable.   Has 2 weeks left of eliquis- was given samples from cardiology A/P: For DVT/PE recommended 6 months of treatment-ideally would avoid coming off of Eliquis prior to 6 months if possible-would wait a minimum of 3 months.  We were able to provide 4 weeks of samples and he thinks he cannot afford a one-time $480 in january cost and then the $80 subsequently per month to get to 6 months.  -already scheduled repeat CT in January -this sounds like provoked DVT/PE after surgery/sedentary activity - would want to avoid coming off of eliquis at least prior to 3 months- ideally 6 months. Thankfully doing somewhat better after surgery and seeing more improvement further he gets out from July surgery.  - small nodule on lungs plan for repeat scan   #hypertension S: medication: Amlodipine 7.5 mg daily - increased from 5 mg per cardiology, Losartan 50 mg ONLY once daily at present, metoprolol 12.5 mg twice daily  BP Readings from Last 3 Encounters:  07/10/21 102/62  05/31/21 132/69  05/20/21 133/80  A/P: Controlled. Continue current medications.  -mild  intermittent cough on losartan- offered CXR or switch- he wants to monitor for now  #CAD- denies any chest pain. Still with SOB after PE - compliant with aspirin 81 mg in the past - stopped in the hospital as he was started on Eliquis #hyperlipidemia S: Medication:Atorvastatin 80 mg daily - Simvastatin 80 mg in the past but changed in ED and Zetia 10 mg daily- per Dr. [name] from cardiology- Dr. [name] added this in addition to recent atorvastatin change- we discussed in long run may be able to get away with just atorvastatin- will see where #s stand at net visit   Lab Results  Component Value Date   CHOL 184 05/01/2021   HDL 82 05/01/2021   LDLCALC 82 05/01/2021   TRIG 115 05/01/2021   CHOLHDL 2.2 05/01/2021   A/P: update lipid panel today- I would say if LDL below 45 or so possibly stop zetia.  - prior issues with lipitor with myalgias- doing better this time. CAD largely asymptomatic- SOB seems to be from PE- continue current meds  # Depression/Anxiety S: Medication: none for depression , Buspirone 5 mg TID  helpful for anxiety Counseling/therapy: Patient follows with a therapist in [place], [place]- still doing this No thoughts of self harm A/P: reasonable control despite recent hospitalization/stressors- continue current meds  # GERD- Patient compliant on omeprazole 40 mg dailywith add on of pepcid twice daily-  no flare ups recntly- will see if we can reduce meds at follow up in march  Recommended follow up: keep march visit or sooner if you need [place] Future Appointments  Date Time Provider [place]  08/20/2021  9:30 AM [facility]  08/28/2021  9:00 AM [facility] 2 [facility] [place]  09/18/2021  1:30 PM Troy Sine, MD [facility]  10/08/2021  1:00 PM Marin Olp, MD [facility]    Lab/Order associations:   ICD-10-CM   1. Acute pulmonary embolism without acute cor pulmonale, unspecified pulmonary embolism type (HCC)  I26.99     2. Deep vein  thrombosis (DVT) of distal vein of left lower extremity, unspecified chronicity (HCC)  I82.4Z2     3. Primary hypertension  I10     4. Mixed hyperlipidemia  E78.2 CBC with Differential/Platelet    Comprehensive metabolic panel    Lipid panel    5. Gastroesophageal  reflux disease, unspecified whether esophagitis present  K21.9      Meds ordered this encounter  Medications   apixaban (ELIQUIS) 5 MG TABS tablet    Sig: Take 1 tablet (5 mg total) by mouth 2 (two) times daily.    Dispense:  60 tablet    Refill:  4    Do not fill until patient calls please   I,Harris Phan,acting as a scribe for Garret Reddish, MD.,have documented all relevant documentation on the behalf of Garret Reddish, MD,as directed by  Garret Reddish, MD while in the presence of Garret Reddish, MD.  I, Garret Reddish, MD, have reviewed all documentation for this visit. The documentation on 07/10/21 for the exam, diagnosis, procedures, and orders are all accurate and complete.  Return precautions advised.  Garret Reddish, MD

## 2021-07-10 NOTE — Patient Instructions (Addendum)
Health Maintenance Due  Topic Date Due   Zoster Vaccines- Shingrix (1 of 2) consider at pharmacy when you are better Never done   COVID-19 Vaccine (3 - Booster for Moderna series) consider at pharmacy 02/14/2020   Please stop by lab before you go If you have mychart- we will send your results within 3 business days of [place] receiving them.  If you do not have mychart- we will call you about results within 5 business days of [place] receiving them.  *please also note that you will see labs on mychart as soon as they post. I will later go in and write notes on them- will say "notes from Dr. Yong Channel"  Team please let patient do PHQ9 before he leaves today.   Eliquis will be sent to your local pharmacy - let them know when you need it so they can send to you  Recommended follow up: Keep your March visit or sooner if needed.

## 2021-07-10 NOTE — Addendum Note (Signed)
Addended by: Clyde Lundborg A on: 07/10/2021 10:50 AM   Modules accepted: Orders

## 2021-07-12 ENCOUNTER — Telehealth: Payer: Self-pay | Admitting: Family Medicine

## 2021-07-12 NOTE — Telephone Encounter (Signed)
Unsure of who may have called patient, was no one from this office no documentation found

## 2021-07-12 NOTE — Telephone Encounter (Signed)
[name] called in and stated that he received a phone call from someone asking him to come in tomorrow and he stated that he was just seen on Tuesday.

## 2021-07-13 ENCOUNTER — Other Ambulatory Visit: Payer: Self-pay | Admitting: Family Medicine

## 2021-07-16 DIAGNOSIS — H527 Unspecified disorder of refraction: Secondary | ICD-10-CM | POA: Diagnosis not present

## 2021-07-16 DIAGNOSIS — Z961 Presence of intraocular lens: Secondary | ICD-10-CM | POA: Diagnosis not present

## 2021-07-16 DIAGNOSIS — H43813 Vitreous degeneration, bilateral: Secondary | ICD-10-CM | POA: Diagnosis not present

## 2021-07-17 ENCOUNTER — Encounter: Payer: Self-pay | Admitting: Family Medicine

## 2021-07-19 ENCOUNTER — Ambulatory Visit (INDEPENDENT_AMBULATORY_CARE_PROVIDER_SITE_OTHER): Payer: Medicare Other | Admitting: Physician Assistant

## 2021-07-19 VITALS — BP 115/69 | HR 56 | Temp 98.2°F | Ht 71.0 in | Wt 223.2 lb

## 2021-07-19 DIAGNOSIS — I10 Essential (primary) hypertension: Secondary | ICD-10-CM | POA: Diagnosis not present

## 2021-07-19 DIAGNOSIS — R600 Localized edema: Secondary | ICD-10-CM | POA: Diagnosis not present

## 2021-07-19 NOTE — Progress Notes (Signed)
Subjective:    Patient ID: William Park, male    DOB: 1951-05-20, 70 y.o.   MRN: [id number]  Chief Complaint  Patient presents with   Foot Swelling   Leg Swelling    HPI Patient is in today for bilateral lower extremity edema x 1 week roughly. 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 Denies any pain in legs. No bruising or redness. No chest pain, some SOB, but says this is baseline since his diagnosis of provoked PE 05/20/21. Currently on Eliquis 5 mg bid, taking this w/o any issues.  For his HTN, cardiology recently increased amlodipine from 5 mg to 7.5 mg daily. Losartan 50 mg only once per day & Metoprolol 12.5 mg daily.  BP was 142/50-something (yesterday afternoon).  William Park does not wear compression socks.   Recent ECHO normal on 05/19/21.   Past Medical History:  Diagnosis Date   Allergic rhinitis    allegra and flonase   CAD (coronary artery disease)    Dr. [name] s/p 4 angioplasty's with last in 44. ASA. simvastatin 80mg    GERD (gastroesophageal reflux disease)    nexium 40mg  and zantac BID-mout fills with saliva and coughing. Seeing GI in [place] 12/2014   Hyperlipidemia    simvastatin 80mg , niaspan 750mg    Hypertension    losartan 50mg    Myocardial infarction ([place])    "small MI, mild"- 1994   Osteoarthritis    mainly hands. Mobic 15mg     Past Surgical History:  Procedure Laterality Date   ANGIOPLASTY     x4   COLONOSCOPY     EYE SURGERY     LUMBAR LAMINECTOMY/DECOMPRESSION MICRODISCECTOMY Right 02/19/2021   Procedure: L4-L5 LAMINOTOMY/FORAMINOTOMY;  Surgeon: Newman Pies, MD;  Location: [facility];  Service: Neurosurgery;  Laterality: Right;  3C   pyloric stenosis     as a child   TONSILLECTOMY     UPPER GASTROINTESTINAL ENDOSCOPY      Family History  Problem Relation Age of Onset   Dementia Father        passed age 8   Kidney disease Father        CKD   CAD Father     AAA (abdominal aortic aneurysm) Father    Breast cancer Mother    Congestive Heart Failure Mother        11 time of death   CAD Brother    Colon cancer Neg Hx    Esophageal cancer Neg Hx    Stomach cancer Neg Hx    Rectal cancer Neg Hx     Social History   Tobacco Use   Smoking status: Former    Packs/day: 2.00    Years: 25.00    Pack years: 50.00    Types: Cigarettes    Quit date: 07/30/1987    Years since quitting: 33.9   Smokeless tobacco: Never  Vaping Use   Vaping Use: Never used  Substance Use Topics   Alcohol use: Not Currently   Drug use: No     No Known Allergies  Review of Systems NEGATIVE UNLESS OTHERWISE INDICATED IN HPI      Objective:     BP 115/69    Pulse (!) 56    Temp 98.2 F (36.8 C)    Ht 5\' 11"  (1.803 m)    Wt 223 lb 3.2 oz (101.2 kg)    SpO2 97%  BMI 31.13 kg/m   Wt Readings from Last 3 Encounters:  07/19/21 223 lb 3.2 oz (101.2 kg)  07/10/21 221 lb 3.2 oz (100.3 kg)  05/31/21 215 lb 3.2 oz (97.6 kg)    BP Readings from Last 3 Encounters:  07/19/21 115/69  07/10/21 102/62  05/31/21 132/69     Physical Exam Vitals and nursing note reviewed.  Constitutional:      General: William Park is not in acute distress.    Appearance: Normal appearance. William Park is not toxic-appearing.  HENT:     Head: Normocephalic and atraumatic.     Right Ear: External ear normal.     Left Ear: External ear normal.     Nose: Nose normal.     Mouth/Throat:     Mouth: Mucous membranes are moist.     Pharynx: Oropharynx is clear.  Eyes:     Extraocular Movements: Extraocular movements intact.     Conjunctiva/sclera: Conjunctivae normal.     Pupils: Pupils are equal, round, and reactive to light.  Cardiovascular:     Rate and Rhythm: Normal rate and regular rhythm.     Pulses: Normal pulses.          Dorsalis pedis pulses are 2+ on the right side and 2+ on the left side.       Posterior tibial pulses are 2+ on the right side and 2+ on the left side.     Heart  sounds: Normal heart sounds.     Comments: -Negative Homan's sign bilaterally -No redness or ecchymosis to either BLE Pulmonary:     Effort: Pulmonary effort is normal.     Breath sounds: Normal breath sounds.  Musculoskeletal:        General: Normal range of motion.     Cervical back: Normal range of motion and neck supple.     Right lower leg: 1+ Pitting Edema present.     Left lower leg: 1+ Pitting Edema present.  Skin:    General: Skin is warm and dry.  Neurological:     General: No focal deficit present.     Mental Status: William Park is alert and oriented to person, place, and time.     Sensory: Sensation is intact.     Gait: Gait normal.  Psychiatric:        Mood and Affect: Mood normal.        Behavior: Behavior normal.       Assessment & Plan:   Problem List Items Addressed This Visit       Cardiovascular and Mediastinum   Hypertension   Other Visit Diagnoses     Bilateral lower extremity edema    -  Primary       1. Bilateral lower extremity edema 2. Primary hypertension -Reassured pt does not appear to be any acute DVT / cellulitis / or CHF processes. Most likely cause is 2/2 recent increase in amlodipine.  -Discussed with patient's PCP today and agree to the following: Increase Losartan to 50 mg TWICE daily Decrease Amlodipine to 5 mg once daily Wear compression socks during work  At the end of the day, elevate above heart level  Limit sodium in diet   -Pt to track BP at home & f/up in 1 month  -William Park knows to call sooner if any changes or concerns.   This note was prepared with assistance of Systems analyst. Occasional wrong-word or sound-a-like substitutions may have occurred due to the inherent limitations of voice recognition software.  Time Spent: 35 minutes of total time was spent on the date of the encounter performing the following actions: chart review prior to seeing the patient, obtaining history, performing a medically necessary  exam, counseling on the treatment plan, placing orders, and documenting in our EHR.    Nivia Gervase M Raziah Funnell, PA-C

## 2021-07-19 NOTE — Patient Instructions (Addendum)
Increase Losartan to 50 mg TWICE daily Decrease Amlodipine to 5 mg once daily Wear compression socks during work  At the end of the day, elevate above heart level  Limit sodium in diet   Track BP at home and bring log to next appointment   Call if any concerns

## 2021-07-29 ENCOUNTER — Other Ambulatory Visit: Payer: Self-pay | Admitting: Cardiovascular Disease

## 2021-07-31 DIAGNOSIS — F321 Major depressive disorder, single episode, moderate: Secondary | ICD-10-CM | POA: Diagnosis not present

## 2021-07-31 DIAGNOSIS — F322 Major depressive disorder, single episode, severe without psychotic features: Secondary | ICD-10-CM | POA: Diagnosis not present

## 2021-08-14 DIAGNOSIS — F322 Major depressive disorder, single episode, severe without psychotic features: Secondary | ICD-10-CM | POA: Diagnosis not present

## 2021-08-14 DIAGNOSIS — F321 Major depressive disorder, single episode, moderate: Secondary | ICD-10-CM | POA: Diagnosis not present

## 2021-08-20 ENCOUNTER — Ambulatory Visit (INDEPENDENT_AMBULATORY_CARE_PROVIDER_SITE_OTHER): Payer: Medicare Other | Admitting: Family Medicine

## 2021-08-20 ENCOUNTER — Encounter: Payer: Self-pay | Admitting: Family Medicine

## 2021-08-20 ENCOUNTER — Other Ambulatory Visit: Payer: Self-pay

## 2021-08-20 ENCOUNTER — Ambulatory Visit (INDEPENDENT_AMBULATORY_CARE_PROVIDER_SITE_OTHER): Payer: Medicare Other

## 2021-08-20 VITALS — BP 122/62 | HR 52 | Temp 98.7°F | Ht 71.0 in | Wt 224.0 lb

## 2021-08-20 DIAGNOSIS — E782 Mixed hyperlipidemia: Secondary | ICD-10-CM

## 2021-08-20 DIAGNOSIS — F325 Major depressive disorder, single episode, in full remission: Secondary | ICD-10-CM | POA: Diagnosis not present

## 2021-08-20 DIAGNOSIS — I25118 Atherosclerotic heart disease of native coronary artery with other forms of angina pectoris: Secondary | ICD-10-CM

## 2021-08-20 DIAGNOSIS — I824Z2 Acute embolism and thrombosis of unspecified deep veins of left distal lower extremity: Secondary | ICD-10-CM | POA: Diagnosis not present

## 2021-08-20 DIAGNOSIS — Z0000 Encounter for general adult medical examination without abnormal findings: Secondary | ICD-10-CM | POA: Diagnosis not present

## 2021-08-20 DIAGNOSIS — I2699 Other pulmonary embolism without acute cor pulmonale: Secondary | ICD-10-CM

## 2021-08-20 DIAGNOSIS — I10 Essential (primary) hypertension: Secondary | ICD-10-CM | POA: Diagnosis not present

## 2021-08-20 NOTE — Patient Instructions (Signed)
[name] , Thank you for taking time to come for your Medicare Wellness Visit. I appreciate your ongoing commitment to your health goals. Please review the following plan we discussed and let me know if I can assist you in the future.   Screening recommendations/referrals: Colonoscopy: Done 07/06/19 repeat every 10 years  Recommended yearly ophthalmology/optometry visit for glaucoma screening and checkup Recommended yearly dental visit for hygiene and checkup  Vaccinations: Influenza vaccine: Done 04/09/21 repeat every year  Pneumococcal vaccine: Up to date Tdap vaccine: Done 01/26/16 repeat every 10 years  Shingles vaccine: 1st dose 07/31/21    Covid-19: Completed 4/26 & 12/20/19  Advanced directives: copies in chart   Conditions/risks identified: live long   Next appointment: Follow up in one year for your annual wellness visit.   Preventive Care 39 Years and Older, Male Preventive care refers to lifestyle choices and visits with your health care provider that can promote health and wellness. What does preventive care include? A yearly physical exam. This is also called an annual well check. Dental exams once or twice a year. Routine eye exams. Ask your health care provider how often you should have your eyes checked. Personal lifestyle choices, including: Daily care of your teeth and gums. Regular physical activity. Eating a healthy diet. Avoiding tobacco and drug use. Limiting alcohol use. Practicing safe sex. Taking low doses of aspirin every day. Taking vitamin and mineral supplements as recommended by your health care provider. What happens during an annual well check? The services and screenings done by your health care provider during your annual well check will depend on your age, overall health, lifestyle risk factors, and family history of disease. Counseling  Your health care provider may ask you questions about your: Alcohol use. Tobacco use. Drug  use. Emotional well-being. Home and relationship well-being. Sexual activity. Eating habits. History of falls. Memory and ability to understand (cognition). Work and work Statistician. Screening  You may have the following tests or measurements: Height, weight, and BMI. Blood pressure. Lipid and cholesterol levels. These may be checked every 5 years, or more frequently if you are over 19 years old. Skin check. Lung cancer screening. You may have this screening every year starting at age 44 if you have a 30-pack-year history of smoking and currently smoke or have quit within the past 15 years. Fecal occult blood test (FOBT) of the stool. You may have this test every year starting at age 15. Flexible sigmoidoscopy or colonoscopy. You may have a sigmoidoscopy every 5 years or a colonoscopy every 10 years starting at age 43. Prostate cancer screening. Recommendations will vary depending on your family history and other risks. Hepatitis C blood test. Hepatitis B blood test. Sexually transmitted disease (STD) testing. Diabetes screening. This is done by checking your blood sugar (glucose) after you have not eaten for a while (fasting). You may have this done every 1-3 years. Abdominal aortic aneurysm (AAA) screening. You may need this if you are a current or former smoker. Osteoporosis. You may be screened starting at age 62 if you are at high risk. Talk with your health care provider about your test results, treatment options, and if necessary, the need for more tests. Vaccines  Your health care provider may recommend certain vaccines, such as: Influenza vaccine. This is recommended every year. Tetanus, diphtheria, and acellular pertussis (Tdap, Td) vaccine. You may need a Td booster every 10 years. Zoster vaccine. You may need this after age 6. Pneumococcal 13-valent conjugate (PCV13)  vaccine. One dose is recommended after age 46. Pneumococcal polysaccharide (PPSV23) vaccine. One dose is  recommended after age 36. Talk to your health care provider about which screenings and vaccines you need and how often you need them. This information is not intended to replace advice given to you by your health care provider. Make sure you discuss any questions you have with your health care provider. Document Released: 08/11/2015 Document Revised: 04/03/2016 Document Reviewed: 05/16/2015 Elsevier Interactive Patient Education  2017 [employer] Prevention in the Home Falls can cause injuries. They can happen to people of all ages. There are many things you can do to make your home safe and to help prevent falls. What can I do on the outside of my home? Regularly fix the edges of walkways and driveways and fix any cracks. Remove anything that might make you trip as you walk through a door, such as a raised step or threshold. Trim any bushes or trees on the path to your home. Use bright outdoor lighting. Clear any walking paths of anything that might make someone trip, such as rocks or tools. Regularly check to see if handrails are loose or broken. Make sure that both sides of any steps have handrails. Any raised decks and porches should have guardrails on the edges. Have any leaves, snow, or ice cleared regularly. Use sand or salt on walking paths during winter. Clean up any spills in your garage right away. This includes oil or grease spills. What can I do in the bathroom? Use night lights. Install grab bars by the toilet and in the tub and shower. Do not use towel bars as grab bars. Use non-skid mats or decals in the tub or shower. If you need to sit down in the shower, use a plastic, non-slip stool. Keep the floor dry. Clean up any water that spills on the floor as soon as it happens. Remove soap buildup in the tub or shower regularly. Attach bath mats securely with double-sided non-slip rug tape. Do not have throw rugs and other things on the floor that can make you  trip. What can I do in the bedroom? Use night lights. Make sure that you have a light by your bed that is easy to reach. Do not use any sheets or blankets that are too big for your bed. They should not hang down onto the floor. Have a firm chair that has side arms. You can use this for support while you get dressed. Do not have throw rugs and other things on the floor that can make you trip. What can I do in the kitchen? Clean up any spills right away. Avoid walking on wet floors. Keep items that you use a lot in easy-to-reach places. If you need to reach something above you, use a strong step stool that has a grab bar. Keep electrical cords out of the way. Do not use floor polish or wax that makes floors slippery. If you must use wax, use non-skid floor wax. Do not have throw rugs and other things on the floor that can make you trip. What can I do with my stairs? Do not leave any items on the stairs. Make sure that there are handrails on both sides of the stairs and use them. Fix handrails that are broken or loose. Make sure that handrails are as long as the stairways. Check any carpeting to make sure that it is firmly attached to the stairs. Fix any carpet that is loose  or worn. Avoid having throw rugs at the top or bottom of the stairs. If you do have throw rugs, attach them to the floor with carpet tape. Make sure that you have a light switch at the top of the stairs and the bottom of the stairs. If you do not have them, ask someone to add them for you. What else can I do to help prevent falls? Wear shoes that: Do not have high heels. Have rubber bottoms. Are comfortable and fit you well. Are closed at the toe. Do not wear sandals. If you use a stepladder: Make sure that it is fully opened. Do not climb a closed stepladder. Make sure that both sides of the stepladder are locked into place. Ask someone to hold it for you, if possible. Clearly mark and make sure that you can  see: Any grab bars or handrails. First and last steps. Where the edge of each step is. Use tools that help you move around (mobility aids) if they are needed. These include: Canes. Walkers. Scooters. Crutches. Turn on the lights when you go into a dark area. Replace any light bulbs as soon as they burn out. Set up your furniture so you have a clear path. Avoid moving your furniture around. If any of your floors are uneven, fix them. If there are any pets around you, be aware of where they are. Review your medicines with your doctor. Some medicines can make you feel dizzy. This can increase your chance of falling. Ask your doctor what other things that you can do to help prevent falls. This information is not intended to replace advice given to you by your health care provider. Make sure you discuss any questions you have with your health care provider. Document Released: 05/11/2009 Document Revised: 12/21/2015 Document Reviewed: 08/19/2014 Elsevier Interactive Patient Education  2017 [employer].

## 2021-08-20 NOTE — Progress Notes (Addendum)
Virtual Visit via Telephone Note  I connected with  William Park on 08/20/21 at  9:30 AM EST by telephone and verified that I am speaking with the correct person using two identifiers.  Medicare Annual Wellness visit completed telephonically due to Covid-19 pandemic.   Persons participating in this call: This Health Coach and this patient.   Location: Patient: Home Provider: Office    I discussed the limitations, risks, security and privacy concerns of performing an evaluation and management service by telephone and the availability of in person appointments. The patient expressed understanding and agreed to proceed.  Unable to perform video visit due to video visit attempted and failed and/or patient does not have video capability.   Some vital signs may be absent or patient reported.   Willette Brace, LPN   Subjective:   William Park is a 71 y.o. male who presents for Medicare Annual/Subsequent preventive examination.  Review of Systems     Cardiac Risk Factors include: advanced age (>37men, >5 women);obesity (BMI >30kg/m2);male gender;dyslipidemia;hypertension     Objective:    There were no vitals filed for this visit. There is no height or weight on file to calculate BMI.  Advanced Directives 08/20/2021 05/18/2021 05/18/2021 02/19/2021 04/10/2020 03/15/2019 12/09/2017  Does Patient Have a Medical Advance Directive? Yes Yes No Yes Yes Yes Yes  Type of Advance Directive Healthcare Power of Attorney Living will - Living will [employer];Living will [place];Living will [place];Living will  Does patient want to make changes to medical advance directive? - No - Patient declined - No - Patient declined - No - Patient declined No - Patient declined  Copy of [place] in Chart? Yes - validated most recent copy scanned in chart (See row information) - - - No - copy requested No - copy  requested No - copy requested    Current Medications (verified) Outpatient Encounter Medications as of 08/20/2021  Medication Sig   acetaminophen (TYLENOL) 500 MG tablet Take 1,000 mg by mouth every 6 (six) hours as needed for moderate pain.   amLODipine (NORVASC) 5 MG tablet Take 1.5 tablets (7.5 mg total) by mouth daily.   apixaban (ELIQUIS) 5 MG TABS tablet Take 1 tablet (5 mg total) by mouth 2 (two) times daily.   atorvastatin (LIPITOR) 80 MG tablet Take 1 tablet (80 mg total) by mouth daily.   busPIRone (BUSPAR) 5 MG tablet Take 1 tablet (5 mg total) by mouth 3 (three) times daily as needed.   docusate sodium (COLACE) 100 MG capsule Take 1 capsule (100 mg total) by mouth 2 (two) times daily. (Patient taking differently: Take 100 mg by mouth 2 (two) times daily as needed for mild constipation.)   ezetimibe (ZETIA) 10 MG tablet TAKE ONE TABLET BY MOUTH ONCE DAILY   famotidine (PEPCID) 20 MG tablet Take 1 tablet (20 mg total) by mouth 2 (two) times daily.   fluticasone (FLONASE) 50 MCG/ACT nasal spray Place 2 sprays into both nostrils daily as needed for allergies or rhinitis.   gabapentin (NEURONTIN) 300 MG capsule TAKE ONE CAPSULE BY MOUTH THREE TIMES DAILY   losartan (COZAAR) 50 MG tablet TAKE ONE TABLET BY MOUTH ONCE DAILY (Patient taking differently: 50 mg in the morning and at bedtime.)   metoprolol tartrate (LOPRESSOR) 25 MG tablet Take 0.5 tablets (12.5 mg total) by mouth 2 (two) times daily.   omeprazole (PRILOSEC) 40 MG capsule Take 1 capsule (40  mg total) by mouth daily. [place].   sildenafil (VIAGRA) 100 MG tablet Take 1 tablet (100 mg total) by mouth daily as needed for erectile dysfunction.   No facility-administered encounter medications on file as of 08/20/2021.    Allergies (verified) Patient has no known allergies.   History: Past Medical History:  Diagnosis Date   Allergic rhinitis    allegra and flonase   CAD (coronary artery disease)    Dr.  [name] s/p 4 angioplasty's with last in 24. ASA. simvastatin 80mg    GERD (gastroesophageal reflux disease)    nexium 40mg  and zantac BID-mout fills with saliva and coughing. Seeing GI in [place] 12/2014   Hyperlipidemia    simvastatin 80mg , niaspan 750mg    Hypertension    losartan 50mg    Myocardial infarction ([place])    "small MI, mild"- 1994   Osteoarthritis    mainly hands. Mobic 15mg    Past Surgical History:  Procedure Laterality Date   ANGIOPLASTY     x4   COLONOSCOPY     EYE SURGERY     LUMBAR LAMINECTOMY/DECOMPRESSION MICRODISCECTOMY Right 02/19/2021   Procedure: L4-L5 LAMINOTOMY/FORAMINOTOMY;  Surgeon: Newman Pies, MD;  Location: [facility];  Service: Neurosurgery;  Laterality: Right;  3C   pyloric stenosis     as a child   TONSILLECTOMY     UPPER GASTROINTESTINAL ENDOSCOPY     Family History  Problem Relation Age of Onset   Dementia Father        passed age 66   Kidney disease Father        CKD   CAD Father    AAA (abdominal aortic aneurysm) Father    Breast cancer Mother    Congestive Heart Failure Mother        76 time of death   CAD Brother    Colon cancer Neg Hx    Esophageal cancer Neg Hx    Stomach cancer Neg Hx    Rectal cancer Neg Hx    Social History   Socioeconomic History   Marital status: Divorced    Spouse name: Not on file   Number of children: Not on file   Years of education: Not on file   Highest education level: Not on file  Occupational History   Not on file  Tobacco Use   Smoking status: Former    Packs/day: 2.00    Years: 25.00    Pack years: 50.00    Types: Cigarettes    Quit date: 07/30/1987    Years since quitting: 34.0   Smokeless tobacco: Never  Vaping Use   Vaping Use: Never used  Substance and Sexual Activity   Alcohol use: Yes   Drug use: No   Sexual activity: Not on file  Other Topics Concern   Not on file  Social History Narrative   Family: Divorced. 2 daughters. 3 granddaughters. Doesn't get to see them.        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   Social Determinants of Radio broadcast assistant Strain: Low Risk    Difficulty of Paying Living Expenses: Not hard at all  Food Insecurity: No Food Insecurity   Worried About Charity fundraiser in the Last Year: Never true   Arboriculturist in the Last Year: Never true  Transportation Needs: No Transportation Needs   Lack of Transportation (Medical): No   Lack of Transportation (  Non-Medical): No  Physical Activity: Inactive   Days of Exercise per Week: 0 days   Minutes of Exercise per Session: 0 min  Stress: Stress Concern Present   Feeling of Stress : To some extent  Social Connections: Moderately Isolated   Frequency of Communication with Friends and Family: Twice a week   Frequency of Social Gatherings with Friends and Family: Once a week   Attends Religious Services: 1 to 4 times per year   Active Member of [employer] or Organizations: No   Attends Music therapist: Never   Marital Status: Divorced    Tobacco Counseling Counseling given: Not Answered   Clinical Intake:  Pre-visit preparation completed: Yes  Pain : No/denies pain     BMI - recorded: 31.14 Nutritional Status: BMI > 30  Obese Nutritional Risks: None Diabetes: No  How often do you need to have someone help you when you read instructions, pamphlets, or other written materials from your doctor or pharmacy?: 1 - Never  Diabetic?no  Interpreter Needed?: No  Information entered by :: Charlott Rakes, LPN   Activities of Daily Living In your present state of health, do you have any difficulty performing the following activities: 08/20/2021 05/18/2021  Hearing? N N  Vision? N N  Difficulty concentrating or making decisions? N N  Walking or climbing stairs? N Y  Comment - secondary to back pain and bilateral leg weakness  Dressing or bathing? N Y  Comment - secondary to back pain and bilateral leg  weakness  Doing errands, shopping? N N  Preparing Food and eating ? N -  Using the Toilet? N -  In the past six months, have you accidently leaked urine? N -  Do you have problems with loss of bowel control? N -  Managing your Medications? N -  Managing your Finances? N -  Housekeeping or managing your Housekeeping? N -  Some recent data might be hidden    Patient Care Team: Marin Olp, MD as PCP - General (Family Medicine) Troy Sine, MD as PCP - Cardiology (Cardiology) Renee Ramus, MD as Consulting Physician (Cardiology) Madelin Rear, [facility] (Pharmacist) Madelin Rear, [facility] as Pharmacist (Pharmacist)  Indicate any recent Medical Services you may have received from other than [facility] providers in the past year (date may be approximate).     Assessment:   This is a routine wellness examination for [place].  Hearing/Vision screen Hearing Screening - Comments:: Pt denies any hearing issues  Vision Screening - Comments:: Pt follows up with Dr Alvina Chou in [place] for annual eye exams   Dietary issues and exercise activities discussed: Current Exercise Habits: The patient has a physically strenuous job, but has no regular exercise apart from work.   Goals Addressed             This Visit's Progress    Patient Stated       Live long        Depression Screen PHQ 2/9 Scores 08/20/2021 07/10/2021 04/09/2021 08/28/2020 04/10/2020 04/10/2020 02/28/2020  PHQ - 2 Score 1 2 3  0 1 1 6   PHQ- 9 Score - 2 5 0 9 - 22    Fall Risk Fall Risk  08/20/2021 08/28/2020 04/10/2020 02/28/2020 03/15/2019  Falls in the past year? 0 0 0 0 0  Comment - - - - -  Number falls in past yr: 0 0 0 0 0  Comment - - - - -  Injury with Fall? 0 0 0 0  0  Risk for fall due to : Impaired vision - Impaired vision - -  Risk for fall due to: Comment - - just readers - -  Follow up Falls prevention discussed - Falls prevention discussed - Education provided    FALL RISK PREVENTION PERTAINING TO THE HOME:  Any  stairs in or around the home? Yes  If so, are there any without handrails? No  Home free of loose throw rugs in walkways, pet beds, electrical cords, etc? Yes  Adequate lighting in your home to reduce risk of falls? Yes   ASSISTIVE DEVICES UTILIZED TO PREVENT FALLS:  Life alert? No  Use of a cane, walker or w/c? No  Grab bars in the bathroom? Yes  Shower chair or bench in shower? No  Elevated toilet seat or a handicapped toilet? No   TIMED UP AND GO:  Was the test performed? No .   Cognitive Function:     6CIT Screen 08/20/2021 04/10/2020  What Year? 0 points 0 points  What month? 0 points 0 points  What time? 0 points -  Count back from 20 0 points 0 points  Months in reverse 0 points 0 points  Repeat phrase 0 points 0 points  Total Score 0 -    Immunizations Immunization History  Administered Date(s) Administered   Fluad Quad(high Dose 65+) 04/10/2020, 04/09/2021   Influenza Whole 04/29/2019   Influenza, High Dose Seasonal PF 04/07/2017, 05/12/2018   Influenza,inj,Quad PF,6+ Mos 07/21/2015   Influenza-Unspecified 07/17/2016   Moderna Sars-Covid-2 Vaccination 11/22/2019, 12/20/2019   Pneumococcal Conjugate-13 07/21/2015   Pneumococcal Polysaccharide-23 03/10/2017   Tdap 01/26/2016   Zoster Recombinat (Shingrix) 07/31/2021    TDAP status: Up to date  Flu Vaccine status: Up to date  Pneumococcal vaccine status: Up to date  Covid-19 vaccine status: Completed vaccines  Qualifies for Shingles Vaccine? Yes   Zostavax completed No   Shingrix Completed?: Yes 1st dose 07/31/21  Screening Tests Health Maintenance  Topic Date Due   COVID-19 Vaccine (3 - Booster for Moderna series) 02/14/2020   Hepatitis C Screening  07/25/2098 (Originally 07/08/1969)   Zoster Vaccines- Shingrix (2 of 2) 09/25/2021   TETANUS/TDAP  01/25/2026   COLONOSCOPY (Pts 45-105yrs Insurance coverage will need to be confirmed)  07/05/2029   Pneumonia Vaccine 46+ Years old  Completed   INFLUENZA  VACCINE  Completed   HPV VACCINES  Aged Out   Fecal DNA (Cologuard)  Discontinued    Health Maintenance  Health Maintenance Due  Topic Date Due   COVID-19 Vaccine (3 - Booster for Moderna series) 02/14/2020    Colorectal cancer screening: Type of screening: Colonoscopy. Completed 07/06/19. Repeat every 10 years   Additional Screening:  Hepatitis C Screening: does qualify;  Vision Screening: Recommended annual ophthalmology exams for early detection of glaucoma and other disorders of the eye. Is the patient up to date with their annual eye exam?  Yes  Who is the provider or what is the name of the office in which the patient attends annual eye exams? Dr Alvina Chou in [place] If pt is not established with a provider, would they like to be referred to a provider to establish care? Yes .   Dental Screening: Recommended annual dental exams for proper oral hygiene  Community Resource Referral / Chronic Care Management: CRR required this visit?  No   CCM required this visit?  No      Plan:     I have personally reviewed and noted the following in  the patients chart:   Medical and social history Use of alcohol, tobacco or illicit drugs  Current medications and supplements including opioid prescriptions. Patient is not currently taking opioid prescriptions. Functional ability and status Nutritional status Physical activity Advanced directives List of other physicians Hospitalizations, surgeries, and ER visits in previous 12 months Vitals Screenings to include cognitive, depression, and falls Referrals and appointments  In addition, I have reviewed and discussed with patient certain preventive protocols, quality metrics, and best practice recommendations. A written personalized care plan for preventive services as well as general preventive health recommendations were provided to patient.     Willette Brace, LPN   3/49/1791   Nurse Notes: None

## 2021-08-20 NOTE — Patient Instructions (Addendum)
reasonable control or reflux - trial cutting out pepcid in the morning. When he runs out of omeprazole 40 mg could try 20 mg over the counter along with pepcid before dinner and then update me maybe 2 weeks after that. If he does well I may have him cut out omeprazole completely and restart pepcid twice daily  Lets trial off zetia for 6 weeks before next visit and I will recheck your LDL/bad cholesterol   - he is doing a great job with his home BP log. He will continue this but 2 weeks before next visit trial just 2.5 of amlodipine so we can see how BP and swelling does with this  Recommended follow up: see if they can push back your march visit about a month- that would line up with the time wed be looking at stopping eliquis

## 2021-08-20 NOTE — Progress Notes (Signed)
Phone [phone] In person visit   Subjective:   William Park is a 71 y.o. year old very pleasant male patient who presents for/with See problem oriented charting Chief Complaint  Patient presents with   Hypertension    This visit occurred during the SARS-CoV-2 public health emergency.  Safety protocols were in place, including screening questions prior to the visit, additional usage of staff PPE, and extensive cleaning of exam room while observing appropriate contact time as indicated for disinfecting solutions.   Past Medical History-  Patient Active Problem List   Diagnosis Date Noted   Deep vein thrombosis (DVT) of distal vein of left lower extremity ([place]) 07/10/2021    Priority: High   Pulmonary embolism ([place]) 05/18/2021    Priority: High   CAD (coronary artery disease)     Priority: High   Hyperglycemia 05/18/2021    Priority: Medium    Alcohol use 05/18/2021    Priority: Medium    Spinal stenosis of lumbar region with neurogenic claudication 02/19/2021    Priority: Medium    Major depressive disorder with single episode, in full remission ([place]) 05/12/2018    Priority: Medium    Hyperlipidemia     Priority: Medium    Hypertension     Priority: Medium    GERD (gastroesophageal reflux disease)     Priority: Medium    Osteoarthritis     Priority: Medium    Former smoker 01/06/2015    Priority: Low   Allergic rhinitis     Priority: Low   Overweight (BMI 25.0-29.9) 05/19/2021   Trochanteric bursitis of right hip 04/20/2021   Right leg pain 01/30/2021   Other chronic pain 01/30/2021   Peroneal tendinitis, right 05/18/2018   Anemia 08/13/2016    Medications- reviewed and updated Current Outpatient Medications  Medication Sig Dispense Refill   acetaminophen (TYLENOL) 500 MG tablet Take 1,000 mg by mouth every 6 (six) hours as needed for moderate pain.     amLODipine (NORVASC) 5 MG tablet Take 1.5 tablets (7.5 mg total) by mouth daily. 135 tablet 3    apixaban (ELIQUIS) 5 MG TABS tablet Take 1 tablet (5 mg total) by mouth 2 (two) times daily. 60 tablet 4   atorvastatin (LIPITOR) 80 MG tablet Take 1 tablet (80 mg total) by mouth daily. 90 tablet 3   busPIRone (BUSPAR) 5 MG tablet Take 1 tablet (5 mg total) by mouth 3 (three) times daily as needed. 270 tablet 3   docusate sodium (COLACE) 100 MG capsule Take 1 capsule (100 mg total) by mouth 2 (two) times daily. (Patient taking differently: Take 100 mg by mouth 2 (two) times daily as needed for mild constipation.) 10 capsule 0   ezetimibe (ZETIA) 10 MG tablet TAKE ONE TABLET BY MOUTH ONCE DAILY 90 tablet 3   famotidine (PEPCID) 20 MG tablet Take 1 tablet (20 mg total) by mouth 2 (two) times daily. 180 tablet 3   fluticasone (FLONASE) 50 MCG/ACT nasal spray Place 2 sprays into both nostrils daily as needed for allergies or rhinitis.     gabapentin (NEURONTIN) 300 MG capsule TAKE ONE CAPSULE BY MOUTH THREE TIMES DAILY 90 capsule 1   losartan (COZAAR) 50 MG tablet TAKE ONE TABLET BY MOUTH ONCE DAILY (Patient taking differently: 50 mg in the morning and at bedtime.) 90 tablet 1   metoprolol tartrate (LOPRESSOR) 25 MG tablet Take 0.5 tablets (12.5 mg total) by mouth 2 (two) times daily. 180 tablet 3   omeprazole (PRILOSEC) 40  MG capsule Take 1 capsule (40 mg total) by mouth daily. [place]. 90 capsule 3   sildenafil (VIAGRA) 100 MG tablet Take 1 tablet (100 mg total) by mouth daily as needed for erectile dysfunction. 100 tablet 11   No current facility-administered medications for this visit.     Objective:  BP 122/62 (BP Location: Left Arm, Patient Position: Sitting, Cuff Size: Large)    Pulse (!) 52    Temp 98.7 F (37.1 C) (Temporal)    Ht 5\' 11"  (1.803 m)    Wt 224 lb (101.6 kg)    SpO2 96%    BMI 31.24 kg/m  Gen: NAD, resting comfortably CV: slightly bradycardic but regular, no murmurs rubs or gallops Lungs: CTAB no crackles, wheeze, rhonchi Ext: 1+ edema, left worse than  right Skin: warm, dry    Assessment and Plan   #DVT/PE S: Patient diagnosed with pulmonary embolism on 05/18/2021.  Also diagnosed with DVT.  I was also a 9 x 7 mm nodule right lung base and they recommended considering 66-month follow-up to ensure resolution and exclude possibility of underlying pulmonary nodule.  He is already scheduled for repeat CT on 08/28/2021 with CT angiogram.   Patient compliant with Eliquis 5 mg twice daily-originally cost was a big concern A/P: DVT and PE likely still in place but improving- id recommend full 6 months of therapy and then we can reevaluate.- he is ok with holding off on repeat DVT scan -we will follow up nodule as well -next eliquis is supposed to be 17% of the $380 he paid so should be more reasonabe -this was provoked after extended period of sedentary activity after back surgery.   #CAD  #hyperlipidemia S: Medication: Atorvastatin 80 mg, Zetia 10 mg-may not require Zetia - no chest pain. Some shortness of breath after PE but improving with time A/P: CAD largely asymptomatic- some SOB likely from PE- continue statin as well as eliquis- he knows when stops eliquis to start back aspirin 81mg  - for zetia - Lets trial off zetia for 6 weeks before next visit and I will recheck your LDL/bad cholesterol. This is due to patients zetia being added after atorvastatin was increased to 80mg  but without LDL check in between   #hypertension S: medication: Amlodipine 5 mg (decreased from 7.5 mg at last visit due to swelling but swelling didn't improve), losartan 50 mg twice daily, Toprol 12.5 mg twice daily -Mild intermittent cough on losartan Home readings #s: some variability but average scertainly under 166/06 BP Readings from Last 3 Encounters:  08/20/21 122/62  07/19/21 115/69  07/10/21 102/62  A/P: Controlled. Continue current medications.  -amlodipine contributes to edema. Trying to elevate legs and wearing compression stockings and watch salt.   -  he is doing a great job with his home BP log. He will continue this but 2 weeks before next visit trial just 2.5 of amlodipine so we can see how BP and swelling does with this  # Depression/anxiety S: Medication:No medication strictly for depression.  Buspirone 5 mg 3 times daily anxiety Therapy/counseling: Patient follows with a therapist in [place]  still Thoughts of self-harm: none A/P: Controlled. Continue current medications.    # GERD S:Medication: Omeprazole 40 mg daily-also Pepcid up to twice daily A/P: reasonable control or reflux - trial cutting out pepcid in the morning. When he runs out of omeprazole 40 mg could try 20 mg over the counter along with pepcid before dinner and then update me  maybe 2 weeks after that. If he does well I may have him cut out omeprazole completely and restart pepcid twice daily  #History of L4-L5 laminotomy-currently on gabapentin-has seen Dr. Georgina Snell in the past as well as neurosurgery - does fine other than with 12 hours on cement with work  #declines covid shot  Recommended follow up: see if they can push back your march visit about a month- that would line up with the time wed be looking at stopping eliquis Future Appointments  Date Time Provider [place]  08/28/2021  9:00 AM [facility] [place]  09/18/2021  1:30 PM Troy Sine, MD [facility]  10/08/2021  1:00 PM Marin Olp, MD [facility]  09/02/2022  9:30 AM [facility]    Lab/Order associations:   ICD-10-CM   1. Acute pulmonary embolism without acute cor pulmonale, unspecified pulmonary embolism type (HCC)  I26.99     2. Deep vein thrombosis (DVT) of distal vein of left lower extremity, unspecified chronicity (HCC)  I82.4Z2     3. Coronary artery disease of native artery of native heart with stable angina pectoris ([place])  I25.118     4. Major depressive disorder with single episode, in full remission ([place])  F32.5     5.  Primary hypertension  I10     6. Mixed hyperlipidemia  E78.2      No orders of the defined types were placed in this encounter.  Return precautions advised.  Garret Reddish, MD

## 2021-08-28 ENCOUNTER — Ambulatory Visit (HOSPITAL_COMMUNITY)
Admission: RE | Admit: 2021-08-28 | Discharge: 2021-08-28 | Disposition: A | Discharge status: Home / Self Care | Payer: Medicare Other | Source: Ambulatory Visit | Attending: Cardiovascular Disease | Admitting: Cardiovascular Disease

## 2021-08-28 ENCOUNTER — Other Ambulatory Visit: Payer: Self-pay

## 2021-08-28 ENCOUNTER — Encounter (HOSPITAL_COMMUNITY): Payer: Self-pay

## 2021-08-28 DIAGNOSIS — F321 Major depressive disorder, single episode, moderate: Secondary | ICD-10-CM | POA: Diagnosis not present

## 2021-08-28 DIAGNOSIS — I517 Cardiomegaly: Secondary | ICD-10-CM | POA: Diagnosis not present

## 2021-08-28 DIAGNOSIS — F322 Major depressive disorder, single episode, severe without psychotic features: Secondary | ICD-10-CM | POA: Diagnosis not present

## 2021-08-28 DIAGNOSIS — I2699 Other pulmonary embolism without acute cor pulmonale: Secondary | ICD-10-CM | POA: Insufficient documentation

## 2021-08-28 LAB — POCT I-STAT CREATININE: Creatinine, Ser: 1.1 mg/dL (ref 0.61–1.24)

## 2021-08-28 MED ORDER — IOHEXOL 350 MG/ML SOLN
75.0000 mL | Freq: Once | INTRAVENOUS | Status: AC | PRN
  Administered 2021-08-28: 75 mL via INTRAVENOUS

## 2021-08-28 MED ORDER — SODIUM CHLORIDE (PF) 0.9 % IJ SOLN
INTRAMUSCULAR | Status: AC
  Filled 2021-08-28: qty 50

## 2021-09-02 ENCOUNTER — Other Ambulatory Visit: Payer: Self-pay | Admitting: Family Medicine

## 2021-09-05 ENCOUNTER — Other Ambulatory Visit: Payer: Self-pay | Admitting: Family Medicine

## 2021-09-18 ENCOUNTER — Other Ambulatory Visit: Payer: Self-pay

## 2021-09-18 ENCOUNTER — Encounter: Payer: Self-pay | Admitting: Cardiovascular Disease

## 2021-09-18 ENCOUNTER — Ambulatory Visit (INDEPENDENT_AMBULATORY_CARE_PROVIDER_SITE_OTHER): Payer: Medicare Other | Admitting: Cardiovascular Disease

## 2021-09-18 VITALS — BP 124/76 | HR 56 | Ht 71.0 in | Wt 218.2 lb

## 2021-09-18 DIAGNOSIS — I82461 Acute embolism and thrombosis of right calf muscular vein: Secondary | ICD-10-CM

## 2021-09-18 DIAGNOSIS — E785 Hyperlipidemia, unspecified: Secondary | ICD-10-CM

## 2021-09-18 DIAGNOSIS — I2699 Other pulmonary embolism without acute cor pulmonale: Secondary | ICD-10-CM

## 2021-09-18 DIAGNOSIS — R931 Abnormal findings on diagnostic imaging of heart and coronary circulation: Secondary | ICD-10-CM | POA: Diagnosis not present

## 2021-09-18 DIAGNOSIS — Z9861 Coronary angioplasty status: Secondary | ICD-10-CM

## 2021-09-18 DIAGNOSIS — Z7901 Long term (current) use of anticoagulants: Secondary | ICD-10-CM | POA: Diagnosis not present

## 2021-09-18 DIAGNOSIS — I10 Essential (primary) hypertension: Secondary | ICD-10-CM

## 2021-09-18 DIAGNOSIS — I2510 Atherosclerotic heart disease of native coronary artery without angina pectoris: Secondary | ICD-10-CM

## 2021-09-18 NOTE — Progress Notes (Signed)
Cardiology Office Note    Date:  09/23/2021   ID:  William Park, DOB [birth year], MRN [id number]  PCP:  Shelva Majestic, MD  Cardiologist:  Nicki Guadalajara, MD   3 month F/U  cardiology evaluation initially referred through the courtesy of Dr. Tana Conch for establishment of cardiology care.  History of Present Illness:  William Park is a 71 y.o. male who has a history of known CAD, and apparently had undergone remote PTCA by me in 1990 currently had 3 blockages.  He underwent a subsequent PTCA in 1991 and his last catheterization was in 1994.  Subsequently, his insurance changed, and he has been evaluated by Dr. Earna Coder in [place] for his cardiology care.  States his last treadmill test was approximately 2 years ago and he was told that this was "okay."He has had some difficulty with family members with a daughter being a drug addict and having served time in prison and is getting back in prison and another daughter with issues.  He has been followed by Dr. Tana Conch for his primary care.  He had undergone a lower extremity arterial duplex study in July 2022 due to leg swelling and there was no evidence for DVT.  After not having seen him in over 25 years, I saw him for my initial evaluation on May 01, 2021 when he presented to establish cardiology care with me again in [place] instead of [place],  [place].  Over the last 10 months, he has started to notice slight progressive development of exertional shortness of breath with less activity.  He denies any chest pressure.  He is unaware of any arrhythmias.  He denies PND or orthopnea.  He has had some back and right leg discomfort.  Has a history of depression and is on risperidone.  He has been on losartan 50 mg twice a day for hypertension, famotidine for GERD in addition at times to omeprazole.  He is on simvastatin 40 mg for hyperlipidemia and apparently has a prescription for sildenafil as needed for erectile  dysfunction.  During my initial evaluation, his blood pressure was elevated insistent with stage II hypertension.  His losartan dose had recently been increased to 50 mg twice a day and at that time I recommended the addition of amlodipine 5 mg to his medical regimen.  With his exertional dyspnea I scheduled him for 2D echo Doppler study.  Since his prior evaluations were PTCA and not stents to further evaluate his coronary obstructive disease I scheduled him for coronary CTA evaluation.  Also recommend laboratory checked including a CMP, CBC, TSH lipid studies and LP(a).  He was in the hospital from October 21 through May 20, 2021.  He had undergone a CT angio of his chest which had been ordered by me on May 18, 2021 which  demonstrated probable acute pulmonary embolism in the right pulmonary arterial branches and middle lobe thrombus.  As result he was hospitalized with PE and was found to have a left DVT on lower extremity venous Doppler study.  He was started on Eliquis 5 mg twice a day.  Of note, his coronary calcium score on his coronary CTA was elevated at 1198 representing 87 percentile for age and sex with moderate CAD in the LAD, first diagonal and circumflex vessels.  FFR was normal in the RCA and circumflex vessel.  In the LAD proximal was 0.96, mid 0.93, and distal was positive at 0.77.   I saw him on  May 31, 2021 following his hospitalization at which time his shortness of breath has improved. He was on amlodipine 5 mg, losartan 50 mg, and metoprolol tartrate 12.5 mg twice a day for his hypertension ann Eliquis 5 mg twice a day for anticoagulation.  He is on BuSpar.  During that evaluation, it was recommended that he have a 62-month follow-up CT angio of his chest.  His blood pressure remains slightly elevated and I further titrated amlodipine to 7.5 mg and he continued to be on losartan 50 mg daily and metoprolol tartrate 25 mg twice a day.  His lipid panel showed an LDL of 82 despite  being on atorvastatin 80 mg and I recommended addition of Zetia 10 mg.  Since I saw him, he underwent a follow-up CT angio of his chest on August 28, 2021.  This now showed interval resolution of previously seen pulmonary embolus within the right middle and right lower lobes.  Previously seen small pulmonary nodules or airspace opacity of the anterior right lower lobe abutting the major fissure was also resolved.  There was evidence for coronary artery and aortic atherosclerosis.  Presently, he feels well.  He denies chest pain.  He does admit to some swelling left greater than right lower extremity for the last week.  Retrospectively, he tells me he had had back surgery approximately 3 to 4 months prior to his pulmonary embolism.  He is unaware of any chest tightness.  He denies bleeding.  He continues to be on amlodipine 7.5 mg, losartan 50 mg and metoprolol tartrate 12.5 mg twice a day for hypertension.  He is on atorvastatin 80 mg and Zetia 10 mg for hyperlipidemia with his elevated calcium score with target LDL less than 70 and remains on Eliquis 5 mg twice a day.  He presents for evaluation.  Past Medical History:  Diagnosis Date   Allergic rhinitis    allegra and flonase   CAD (coronary artery disease)    Dr. [name] s/p 4 angioplasty's with last in 62. ASA. simvastatin $RemoveBeforeDE'80mg'VRJqnjwjZybUDwF$    GERD (gastroesophageal reflux disease)    nexium $Remov'40mg'Byoenq$  and zantac BID-mout fills with saliva and coughing. Seeing GI in [place] 12/2014   Hyperlipidemia    simvastatin $RemoveBefo'80mg'pTsairYrJWv$ , niaspan $RemoveB'750mg'GLSDpAZt$    Hypertension    losartan $RemoveB'50mg'xtBMdXjF$    Myocardial infarction ([place])    "small MI, mild"- 1994   Osteoarthritis    mainly hands. Mobic $RemoveBefo'15mg'TepgdHUqVpq$     Past Surgical History:  Procedure Laterality Date   ANGIOPLASTY     x4   COLONOSCOPY     EYE SURGERY     LUMBAR LAMINECTOMY/DECOMPRESSION MICRODISCECTOMY Right 02/19/2021   Procedure: L4-L5 LAMINOTOMY/FORAMINOTOMY;  Surgeon: Newman Pies, MD;  Location: [facility];  Service: Neurosurgery;   Laterality: Right;  3C   pyloric stenosis     as a child   TONSILLECTOMY     UPPER GASTROINTESTINAL ENDOSCOPY      Current Medications: Outpatient Medications Prior to Visit  Medication Sig Dispense Refill   acetaminophen (TYLENOL) 500 MG tablet Take 1,000 mg by mouth every 6 (six) hours as needed for moderate pain.     amLODipine (NORVASC) 5 MG tablet Take 1.5 tablets (7.5 mg total) by mouth daily. 135 tablet 3   atorvastatin (LIPITOR) 80 MG tablet Take 1 tablet (80 mg total) by mouth daily. 90 tablet 3   busPIRone (BUSPAR) 5 MG tablet Take 1 tablet (5 mg total) by mouth 3 (three) times daily as needed. 270 tablet 3   docusate sodium (  COLACE) 100 MG capsule Take 1 capsule (100 mg total) by mouth 2 (two) times daily. (Patient taking differently: Take 100 mg by mouth 2 (two) times daily as needed for mild constipation.) 10 capsule 0   ELIQUIS 5 MG TABS tablet TAKE ONE TABLET BY MOUTH TWICE DAILY 74 tablet 0   ezetimibe (ZETIA) 10 MG tablet TAKE ONE TABLET BY MOUTH ONCE DAILY 90 tablet 3   famotidine (PEPCID) 20 MG tablet Take 1 tablet (20 mg total) by mouth 2 (two) times daily. 180 tablet 3   fluticasone (FLONASE) 50 MCG/ACT nasal spray Place 2 sprays into both nostrils daily as needed for allergies or rhinitis.     gabapentin (NEURONTIN) 300 MG capsule TAKE ONE CAPSULE BY MOUTH THREE TIMES DAILY 90 capsule 1   losartan (COZAAR) 50 MG tablet TAKE ONE TABLET BY MOUTH ONCE DAILY (Patient taking differently: 50 mg in the morning and at bedtime.) 90 tablet 1   metoprolol tartrate (LOPRESSOR) 25 MG tablet Take 0.5 tablets (12.5 mg total) by mouth 2 (two) times daily. 180 tablet 3   omeprazole (PRILOSEC) 40 MG capsule Take 1 capsule (40 mg total) by mouth daily. [place]. 90 capsule 3   sildenafil (VIAGRA) 100 MG tablet Take 1 tablet (100 mg total) by mouth daily as needed for erectile dysfunction. 100 tablet 11   No facility-administered medications prior to visit.      Allergies:   Patient has no known allergies.   Social History   Socioeconomic History   Marital status: Divorced    Spouse name: Not on file   Number of children: Not on file   Years of education: Not on file   Highest education level: Not on file  Occupational History   Not on file  Tobacco Use   Smoking status: Former    Packs/day: 2.00    Years: 25.00    Pack years: 50.00    Types: Cigarettes    Quit date: 07/30/1987    Years since quitting: 34.1   Smokeless tobacco: Never  Vaping Use   Vaping Use: Never used  Substance and Sexual Activity   Alcohol use: Yes   Drug use: No   Sexual activity: Not on file  Other Topics Concern   Not on file  Social History Narrative   Family: Divorced. 2 daughters. 3 granddaughters. Doesn't get to see them.       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   Social Determinants of Radio broadcast assistant Strain: Low Risk    Difficulty of Paying Living Expenses: Not hard at all  Food Insecurity: No Food Insecurity   Worried About Charity fundraiser in the Last Year: Never true   Arboriculturist in the Last Year: Never true  Transportation Needs: No Transportation Needs   Lack of Transportation (Medical): No   Lack of Transportation (Non-Medical): No  Physical Activity: Inactive   Days of Exercise per Week: 0 days   Minutes of Exercise per Session: 0 min  Stress: Stress Concern Present   Feeling of Stress : To some extent  Social Connections: Moderately Isolated   Frequency of Communication with Friends and Family: Twice a week   Frequency of Social Gatherings with Friends and Family: Once a week   Attends Religious Services: 1 to 4 times per year   Active Member of Clubs or Organizations: No   Attends  Club or Organization Meetings: Never   Marital Status: Divorced     Socially he is divorced and lives alone.  He has 2 children.  He previously worked as a as a Administrator.   He denies tobacco use.  He does drink occasional alcohol.  He does not exercise.  Family History:  The patient's family history includes AAA (abdominal aortic aneurysm) in his father; Breast cancer in his mother; CAD in his brother and father; Congestive Heart Failure in his mother; Dementia in his father; Kidney disease in his father.   His mother died at age 47 with cancer.  Father died at 44 and had heart disease and kidney problems.  He has a brother age 67 with heart disease and another brother age 53.  ROS General: Negative; No fevers, chills, or night sweats;  HEENT: Occasional tinnitus,  No changes in vision or hearing, sinus congestion, difficulty swallowing Pulmonary: Negative; No cough, wheezing, shortness of breath, hemoptysis Cardiovascular: Negative; No chest pain, presyncope, syncope, palpitations GI: Positive for GERD GU: Negative; No dysuria, hematuria, or difficulty voiding Musculoskeletal: Positive for back discomfort, status postlaminectomy and foramina ectomy July 2022. Hematologic/Oncology: Negative; no easy bruising, bleeding Endocrine: Negative; no heat/cold intolerance; no diabetes Neuro: Negative; no changes in balance, headaches Skin: Negative; No rashes or skin lesions Psychiatric: Positive for depression Sleep: Negative; No snoring, daytime sleepiness, hypersomnolence, bruxism, restless legs, hypnogognic hallucinations, no cataplexy Other comprehensive 14 point system review is negative.   PHYSICAL EXAM:   VS:  BP 124/76    Pulse (!) 56    Ht $R'5\' 11"'qt$  (1.803 m)    Wt 218 lb 3.2 oz (99 kg)    SpO2 96%    BMI 30.43 kg/m     Repeat blood pressure by me was 130/76.  Wt Readings from Last 3 Encounters:  09/18/21 218 lb 3.2 oz (99 kg)  08/20/21 224 lb (101.6 kg)  07/19/21 223 lb 3.2 oz (101.2 kg)    General: Alert, oriented, no distress.  Skin: normal turgor, no rashes, warm and dry HEENT: Normocephalic, atraumatic. Pupils equal round and reactive to light;  sclera anicteric; extraocular muscles intact; Nose without nasal septal hypertrophy Mouth/Parynx benign; Mallinpatti scale 3 Neck: No JVD, no carotid bruits; normal carotid upstroke Lungs: clear to ausculatation and percussion; no wheezing or rales Chest wall: without tenderness to palpitation Heart: PMI not displaced, RRR, s1 s2 normal, 1/6 systolic murmur, no diastolic murmur, no rubs, gallops, thrills, or heaves Abdomen: soft, nontender; no hepatosplenomehaly, BS+; abdominal aorta nontender and not dilated by palpation. Back: no CVA tenderness Pulses 2+ Musculoskeletal: full range of motion, normal strength, no joint deformities Extremities: no clubbing cyanosis or edema, Homan's sign negative  Neurologic: grossly nonfocal; Cranial nerves grossly wnl Psychologic: Normal mood and affect     Studies/Labs Reviewed:   February 21,2023 ECG (independently read by me): Sinus bradycardia at 56, baseline wander  May 31, 2021 ECG (independently read by me):  Sinus bradycardia at 53, normal intervals  May 01, 2021 ECG (independently read by me):  NSR at 82, nonspecific T changes, QTc 486 msec  Recent Labs: BMP Latest Ref Rng & Units 08/28/2021 07/10/2021 05/18/2021  Glucose 70 - 99 mg/dL - 106(H) 108(H)  BUN 6 - 23 mg/dL - 16 17  Creatinine 0.61 - 1.24 mg/dL 1.10 1.13 1.04  BUN/Creat Ratio 10 - 24 - - -  Sodium 135 - 145 mEq/L - 142 138  Potassium 3.5 - 5.1 mEq/L - 3.9 4.2  Chloride  96 - 112 mEq/L - 105 104  CO2 19 - 32 mEq/L - 31 25  Calcium 8.4 - 10.5 mg/dL - 9.0 9.0     Hepatic Function Latest Ref Rng & Units 07/10/2021 05/01/2021 09/26/2020  Total Protein 6.0 - 8.3 g/dL 6.3 7.5 6.6  Albumin 3.5 - 5.2 g/dL 3.7 4.6 3.9  AST 0 - 37 U/L $Remo'24 23 19  'okhjS$ ALT 0 - 53 U/L $Remo'24 20 17  'yrvBQ$ Alk Phosphatase 39 - 117 U/L 78 113 56  Total Bilirubin 0.2 - 1.2 mg/dL 0.9 1.4(H) 0.5  Bilirubin, Direct 0.0 - 0.3 mg/dL - - -    CBC Latest Ref Rng & Units 07/10/2021 05/19/2021 05/18/2021  WBC 4.0 -  10.5 K/uL 5.6 7.2 8.7  Hemoglobin 13.0 - 17.0 g/dL 12.3(L) 13.6 15.2  Hematocrit 39.0 - 52.0 % 37.0(L) 40.4 43.9  Platelets 150.0 - 400.0 K/uL 196.0 140(L) 167   Lab Results  Component Value Date   MCV 97.8 07/10/2021   MCV 98.3 05/19/2021   MCV 96.5 05/18/2021   Lab Results  Component Value Date   TSH 1.010 05/01/2021   No results found for: HGBA1C   BNP No results found for: BNP  ProBNP No results found for: PROBNP   Lipid Panel     Component Value Date/Time   CHOL 95 07/10/2021 1039   CHOL 184 05/01/2021 1207   TRIG 88.0 07/10/2021 1039   HDL 41.60 07/10/2021 1039   HDL 82 05/01/2021 1207   CHOLHDL 2 07/10/2021 1039   VLDL 17.6 07/10/2021 1039   LDLCALC 36 07/10/2021 1039   LDLCALC 82 05/01/2021 1207   LDLCALC 62 04/10/2020 1516   LABVLDL 20 05/01/2021 1207     RADIOLOGY: CT Angio Chest Pulmonary Embolism (PE) W or WO Contrast  Result Date: 08/28/2021 CLINICAL DATA:  Follow-up pulmonary embolism, post treatment EXAM: CT ANGIOGRAPHY CHEST WITH CONTRAST TECHNIQUE: Multidetector CT imaging of the chest was performed using the standard protocol during bolus administration of intravenous contrast. Multiplanar CT image reconstructions and MIPs were obtained to evaluate the vascular anatomy. RADIATION DOSE REDUCTION: This exam was performed according to the departmental dose-optimization program which includes automated exposure control, adjustment of the mA and/or kV according to patient size and/or use of iterative reconstruction technique. CONTRAST:  50mL OMNIPAQUE IOHEXOL 350 MG/ML SOLN COMPARISON:  None. FINDINGS: Cardiovascular: Satisfactory opacification of the pulmonary arteries to the segmental level. Interval resolution of previously seen pulmonary embolus within the right middle and right lower lobes. No evidence of pulmonary embolism. Mild cardiomegaly. Three-vessel coronary artery calcifications. No pericardial effusion. Aortic atherosclerosis. Mediastinum/Nodes:  No enlarged mediastinal, hilar, or axillary lymph nodes. Thyroid gland, trachea, and esophagus demonstrate no significant findings. Lungs/Pleura: Previously seen small pulmonary nodule or airspace opacity of the anterior right lower lobe abutting the major fissure is resolved (series 11, image 81). No pleural effusion or pneumothorax. Upper Abdomen: No acute abnormality. Musculoskeletal: No chest wall abnormality. No acute osseous findings. Review of the MIP images confirms the above findings. IMPRESSION: 1. Interval resolution of previously seen pulmonary embolus within the right middle and right lower lobes. No evidence of pulmonary embolism. 2. Previously seen small pulmonary nodule or airspace opacity of the anterior right lower lobe abutting the major fissure is resolved. 3. Coronary artery disease. Aortic Atherosclerosis (ICD10-I70.0). Electronically Signed   By: Delanna Ahmadi M.D.   On: 08/28/2021 11:15     Additional studies/ records that were reviewed today include:  I reviewed the records of Dr. Garret Reddish.  I did not have records available from his prior angioplasties between 1990 and 1994.  ECHO: 05/19/2021 IMPRESSIONS   1. Left ventricular ejection fraction, by estimation, is 60 to 65%. The  left ventricle has normal function. The left ventricle has no regional  wall motion abnormalities. Left ventricular diastolic parameters are  indeterminate.   2. Right ventricular systolic function is normal. The right ventricular  size is normal. There is normal pulmonary artery systolic pressure.   3. Left atrial size was mildly dilated.   4. Right atrial size was mildly dilated.   5. The mitral valve is normal in structure. Trivial mitral valve  regurgitation. No evidence of mitral stenosis.   6. The aortic valve is grossly normal. Aortic valve regurgitation is not  visualized. No aortic stenosis is present.   7. The inferior vena cava is normal in size with greater than 50%  respiratory  variability, suggesting right atrial pressure of 3 mmHg.   Comparison(s): No prior Echocardiogram.   Conclusion(s)/Recommendation(s): Normal biventricular function without  evidence of hemodynamically significant valvular heart disease. No  evidence of RV enlargement, normal RV function.   ASSESSMENT:    1. Coronary artery disease involving native coronary artery of native heart without angina pectoris   2. History of PTCAs: [place]   3. Elevated coronary artery calcium score: October 2022 - 1198 [place]   4. Primary hypertension   5. Pulmonary embolism, other, unspecified chronicity, unspecified whether acute cor pulmonale present ([place])   6. Deep vein thrombosis (DVT) of calf muscle vein of right lower extremity [facility]): October 2022   7. Anticoagulated   8. Hyperlipidemia with target LDL less than 70     PLAN:  William Park is a 71 year old gentleman who has a history of prior CAD and apparently was told of having 3 blockages in 1990 for which he had undergone percutaneous coronary angioplasty procedures.  His last catheterization was in 1994.  Subsequently, he had lost his insurance and had subsequent follow-up cardiology evaluations by [name] in [facility] and approximately 2 years ago underwent routine treadmill testing and was told that this was apparently "okay."  He reestablish cardiology care with me in early October 2022.  Prior to that evaluation over several months he began to notice a change with development of exertional shortness of breath with less activity.  He denies associated chest tightness or pressure.  He has had issues with back discomfort and had undergone laminectomy and also has had some right leg discomfort.  He has had recent increase in blood pressure requiring titration of medication.  At my initial evaluation on October 4, medications were titrated and amlodipine was started at 5 mg.  I scheduled him to undergo a coronary CTA and on this  study he was found to have elevated calcium score 1198 representing 87th percentile for age and sex.  Incidentally, however he was found to have right lower lobe pulmonary arterial filling defect consistent with PE on this nondedicated exam.  Pulmonary embolism was confirmed on a CT angio of his chest and he also was noted to have new middle lobe thrombus addition to his right pulmonary branches top normal RV to LV ratio suggesting possible right heart strain.  He has now been on Eliquis anticoagulation.  I reviewed his most recent CT imaging which shows resolution of his right middle and right lower lobe pulmonary embolism.  There also was resolution of the previously seen small pulmonary nodule or airspace  opacity of the anterior right lower lobe abutting the major fissure.  His blood pressure today is improved on his increased regimen of amlodipine 7.5 mg with continued losartan 50 mg daily and metoprolol tartrate 12.5 mg twice a day.  With his coronary calcification and calcium score of 1198, aggressive lipid-lowering therapy is recommended and he is now on atorvastatin 80 mg with Zetia 10 mg with target LDL in the 60s or below.  LDL cholesterol was markedly improved on July 10, 2021 at 36.  I discussed with him the importance of aggressive treatment to potentially reduce plaque regression in addition to stabilization.  We discussed timing of anticoagulation.  At minimum he must stay on Eliquis for 6 months but I discussed with him the potential of staying on longer for approximately a year his extensive PE and DVT.  He will be following up with Dr. Yong Channel.    Medication Adjustments/Labs and Tests Ordered: Current medicines are reviewed at length with the patient today.  Concerns regarding medicines are outlined above.  Medication changes, Labs and Tests ordered today are listed in the Patient Instructions below. Patient Instructions  Medication Instructions:  Your physician recommends that you  continue on your current medications as directed. Please refer to the Current Medication list given to you today.  *If you need a refill on your cardiac medications before your next appointment, please call your pharmacy*   Follow-Up: At [facility], you and your health needs are our priority.  As part of our continuing mission to provide you with exceptional heart care, we have created designated Provider Care Teams.  These Care Teams include your primary Cardiologist (physician) and Advanced Practice Providers (APPs -  Physician Assistants and Nurse Practitioners) who all work together to provide you with the care you need, when you need it.  We recommend signing up for the patient portal called "MyChart".  Sign up information is provided on this After Visit Summary.  MyChart is used to connect with patients for Virtual Visits (Telemedicine).  Patients are able to view lab/test results, encounter notes, upcoming appointments, etc.  Non-urgent messages can be sent to your provider as well.   To learn more about what you can do with MyChart, go to [email or web address].    Your next appointment:   6 months with Dr. [name]  -- call in April for an August appointment     Signed, Shelva Majestic, MD  09/23/2021 12:28 PM    [facility] [address], [place], [place], [place]  [address] Phone: [phone]

## 2021-09-18 NOTE — Patient Instructions (Signed)
Medication Instructions:  Your physician recommends that you continue on your current medications as directed. Please refer to the Current Medication list given to you today.  *If you need a refill on your cardiac medications before your next appointment, please call your pharmacy*   Follow-Up: At [facility], you and your health needs are our priority.  As part of our continuing mission to provide you with exceptional heart care, we have created designated Provider Care Teams.  These Care Teams include your primary Cardiologist (physician) and Advanced Practice Providers (APPs -  Physician Assistants and Nurse Practitioners) who all work together to provide you with the care you need, when you need it.  We recommend signing up for the patient portal called "MyChart".  Sign up information is provided on this After Visit Summary.  MyChart is used to connect with patients for Virtual Visits (Telemedicine).  Patients are able to view lab/test results, encounter notes, upcoming appointments, etc.  Non-urgent messages can be sent to your provider as well.   To learn more about what you can do with MyChart, go to [email or web address].    Your next appointment:   6 months with Dr. [name]  -- call in April for an August appointment

## 2021-09-23 ENCOUNTER — Encounter: Payer: Self-pay | Admitting: Cardiovascular Disease

## 2021-09-25 DIAGNOSIS — I10 Essential (primary) hypertension: Secondary | ICD-10-CM | POA: Diagnosis not present

## 2021-09-25 DIAGNOSIS — F322 Major depressive disorder, single episode, severe without psychotic features: Secondary | ICD-10-CM | POA: Diagnosis not present

## 2021-09-25 DIAGNOSIS — M48061 Spinal stenosis, lumbar region without neurogenic claudication: Secondary | ICD-10-CM | POA: Diagnosis not present

## 2021-09-25 DIAGNOSIS — F321 Major depressive disorder, single episode, moderate: Secondary | ICD-10-CM | POA: Diagnosis not present

## 2021-09-25 DIAGNOSIS — Z6829 Body mass index (BMI) 29.0-29.9, adult: Secondary | ICD-10-CM | POA: Diagnosis not present

## 2021-09-26 ENCOUNTER — Encounter: Payer: Self-pay | Admitting: Family Medicine

## 2021-09-26 MED ORDER — ESCITALOPRAM OXALATE 5 MG PO TABS: 5.0000 mg | ORAL_TABLET | Freq: Every day | ORAL | 1 refills | Status: DC

## 2021-10-02 ENCOUNTER — Other Ambulatory Visit: Payer: Self-pay

## 2021-10-02 ENCOUNTER — Telehealth: Payer: Self-pay | Admitting: Pharmacist

## 2021-10-02 MED ORDER — AMLODIPINE BESYLATE 5 MG PO TABS: 5.0000 mg | ORAL_TABLET | Freq: Every day | ORAL | 3 refills | Status: DC

## 2021-10-02 MED ORDER — LOSARTAN POTASSIUM 50 MG PO TABS: 50.0000 mg | ORAL_TABLET | Freq: Two times a day (BID) | ORAL | 3 refills | Status: DC

## 2021-10-02 NOTE — Progress Notes (Addendum)
? ? ?Chronic Care Management ?Pharmacy Assistant  ? ?Name: William Park  MRN: [id number] DOB: 03-03-1951 ? ? ?Reason for Encounter: Medication Coordination Call ?  ? ?Recent office visits:  ?08/20/2021 OV (PCP) Marin Olp, MD; : - for zetia - Lets trial off zetia for 6 weeks before next visit and I will recheck your LDL/bad cholesterol. This is due to patients zetia being added after atorvastatin was increased to '80mg'$  but without LDL check in between. Amlodipine 5 mg (decreased from 7.5 mg at last visit due to swelling but swelling didn't improve), losartan 50 mg twice daily, Toprol 12.5 mg twice daily ? He will continue this but 2 weeks before next visit trial just 2.5 of amlodipine so we can see how BP and swelling does with this. ? reasonable control or reflux - trial cutting out pepcid in the morning. When he runs out of omeprazole 40 mg could try 20 mg over the counter along with pepcid before dinner and then update me maybe 2 weeks after that. If he does well I may have him cut out omeprazole completely and restart pepcid twice daily. ? ?07/19/2021 OV Allwardt, Randa Evens, PA-C; Increase Losartan to 50 mg TWICE daily ?Decrease Amlodipine to 5 mg once daily ? ?07/10/2021 OV (PCP) Marin Olp, MD;  ? ?Recent consult visits:  ?09/18/2021 OV (Cardiology) Troy Sine, MD; no medication changes indicated. ? ?07/16/2021 OV (Ophthalmology) Nelda Bucks, MD; no medication changes indicated. ? ?Hospital visits:  ?None in previous 6 months ? ?Medications: ?Outpatient Encounter Medications as of 10/02/2021  ?Medication Sig  ? acetaminophen (TYLENOL) 500 MG tablet Take 1,000 mg by mouth every 6 (six) hours as needed for moderate pain.  ? amLODipine (NORVASC) 5 MG tablet Take 1.5 tablets (7.5 mg total) by mouth daily.  ? atorvastatin (LIPITOR) 80 MG tablet Take 1 tablet (80 mg total) by mouth daily.  ? busPIRone (BUSPAR) 5 MG tablet Take 1 tablet (5 mg total) by mouth 3 (three) times daily as  needed.  ? docusate sodium (COLACE) 100 MG capsule Take 1 capsule (100 mg total) by mouth 2 (two) times daily. (Patient taking differently: Take 100 mg by mouth 2 (two) times daily as needed for mild constipation.)  ? ELIQUIS 5 MG TABS tablet TAKE ONE TABLET BY MOUTH TWICE DAILY  ? escitalopram (LEXAPRO) 5 MG tablet Take 1 tablet (5 mg total) by mouth daily.  ? ezetimibe (ZETIA) 10 MG tablet TAKE ONE TABLET BY MOUTH ONCE DAILY  ? famotidine (PEPCID) 20 MG tablet Take 1 tablet (20 mg total) by mouth 2 (two) times daily.  ? fluticasone (FLONASE) 50 MCG/ACT nasal spray Place 2 sprays into both nostrils daily as needed for allergies or rhinitis.  ? gabapentin (NEURONTIN) 300 MG capsule TAKE ONE CAPSULE BY MOUTH THREE TIMES DAILY  ? losartan (COZAAR) 50 MG tablet TAKE ONE TABLET BY MOUTH ONCE DAILY (Patient taking differently: 50 mg in the morning and at bedtime.)  ? metoprolol tartrate (LOPRESSOR) 25 MG tablet Take 0.5 tablets (12.5 mg total) by mouth 2 (two) times daily.  ? omeprazole (PRILOSEC) 40 MG capsule Take 1 capsule (40 mg total) by mouth daily. [place].  ? sildenafil (VIAGRA) 100 MG tablet Take 1 tablet (100 mg total) by mouth daily as needed for erectile dysfunction.  ? ?No facility-administered encounter medications on file as of 10/02/2021.  ? ?Reviewed chart for medication changes ahead of medication coordination call. ? ?No OVs, Consults, or hospital  visits since last care coordination call/Pharmacist visit. (If appropriate, list visit date, provider name) ? ?No medication changes indicated OR if recent visit, treatment plan here. ? ?BP Readings from Last 3 Encounters:  ?09/18/21 124/76  ?08/20/21 122/62  ?07/19/21 115/69  ?  ?No results found for: HGBA1C  ? ?Patient obtains medications through Vials  90 Days  ? ?Last adherence delivery included:  ?Amlodipine 5 mg 1.5 tablet daily ?Ezetimibe 10 mg once a day ?Omeprazole 40 mg once a day ?Losartan Potassium 50 mg once a day ?Famotidine 20  mg twice daily ?Buspirone 5 mg three times daily as needed ?Metoprolol Tartrate 12.5 mg twice daily ?Atorvastatin 80 mg once a day ? ?Patient is due for next adherence delivery on: 10/12/2021. ?Called patient and reviewed medications and coordinated delivery. ? ?This delivery to include: ?Amlodipine 5 mg 1 tablet daily - requested new Rx ?Omeprazole 40 mg once a day ?Losartan Potassium 50 mg twice a day - requested new Rx ?Famotidine 20 mg twice daily ?Buspirone 5 mg three times daily as needed ?Metoprolol Tartrate 12.5 mg twice daily ?Atorvastatin 80 mg once a day ?Eliquis 5 mg (30 DS only) - patient states this medication may be discontinued soon ?Escitalopram 5 mg (30 DS only) - patient states this medication may change so he only wants a 30 DS ? ?Patient declined the following medications: ?Ezetimibe 10 mg once a day - discontinued ?Gabapentin 300 mg - discontinued ? ?Patient needs refills for: ?Losartan 50 mg twice daily ?Eliquis 5 mg twice daily ? ?Confirmed delivery date of 10/12/2021, advised patient that pharmacy will contact them the morning of delivery.  ? ?Care Gaps: ?Medicare Annual Wellness: Completed 08/20/2021 ?Hemoglobin A1C: none available ?Colonoscopy: Next due on 07/05/2029 ? ?Future Appointments  ?Date Time Provider [place]  ?11/19/2021  2:20 PM Marin Olp, MD [facility]  ?09/02/2022  9:30 AM [facility] HEALTH COACH [facility]  ? ? ?April D Calhoun, [place] ?Clinical Pharmacist Assistant ?[phone]  ? ?10 minutes spent in review, coordination, and documentation. ? ?Reviewed by: ?Beverly Milch, PharmD ?Clinical Pharmacist ?[phone] ? ?

## 2021-10-05 ENCOUNTER — Other Ambulatory Visit: Payer: Self-pay | Admitting: Family Medicine

## 2021-10-08 ENCOUNTER — Ambulatory Visit: Payer: Medicare Other | Admitting: Family Medicine

## 2021-10-09 DIAGNOSIS — F322 Major depressive disorder, single episode, severe without psychotic features: Secondary | ICD-10-CM | POA: Diagnosis not present

## 2021-10-09 DIAGNOSIS — F321 Major depressive disorder, single episode, moderate: Secondary | ICD-10-CM | POA: Diagnosis not present

## 2021-10-11 ENCOUNTER — Other Ambulatory Visit: Payer: Self-pay | Admitting: Family Medicine

## 2021-10-23 DIAGNOSIS — F322 Major depressive disorder, single episode, severe without psychotic features: Secondary | ICD-10-CM | POA: Diagnosis not present

## 2021-10-23 DIAGNOSIS — F321 Major depressive disorder, single episode, moderate: Secondary | ICD-10-CM | POA: Diagnosis not present

## 2021-10-31 ENCOUNTER — Other Ambulatory Visit: Payer: Self-pay | Admitting: Family Medicine

## 2021-11-06 DIAGNOSIS — F322 Major depressive disorder, single episode, severe without psychotic features: Secondary | ICD-10-CM | POA: Diagnosis not present

## 2021-11-06 DIAGNOSIS — F321 Major depressive disorder, single episode, moderate: Secondary | ICD-10-CM | POA: Diagnosis not present

## 2021-11-12 DIAGNOSIS — F321 Major depressive disorder, single episode, moderate: Secondary | ICD-10-CM | POA: Diagnosis not present

## 2021-11-12 DIAGNOSIS — F322 Major depressive disorder, single episode, severe without psychotic features: Secondary | ICD-10-CM | POA: Diagnosis not present

## 2021-11-19 ENCOUNTER — Encounter: Payer: Self-pay | Admitting: Family Medicine

## 2021-11-19 ENCOUNTER — Ambulatory Visit (INDEPENDENT_AMBULATORY_CARE_PROVIDER_SITE_OTHER): Payer: Medicare Other | Admitting: Family Medicine

## 2021-11-19 VITALS — BP 98/50 | HR 52 | Temp 97.8°F | Ht 71.0 in | Wt 200.0 lb

## 2021-11-19 DIAGNOSIS — R931 Abnormal findings on diagnostic imaging of heart and coronary circulation: Secondary | ICD-10-CM | POA: Diagnosis not present

## 2021-11-19 DIAGNOSIS — E782 Mixed hyperlipidemia: Secondary | ICD-10-CM

## 2021-11-19 DIAGNOSIS — R739 Hyperglycemia, unspecified: Secondary | ICD-10-CM | POA: Diagnosis not present

## 2021-11-19 DIAGNOSIS — I25118 Atherosclerotic heart disease of native coronary artery with other forms of angina pectoris: Secondary | ICD-10-CM | POA: Diagnosis not present

## 2021-11-19 DIAGNOSIS — F325 Major depressive disorder, single episode, in full remission: Secondary | ICD-10-CM

## 2021-11-19 DIAGNOSIS — I824Z2 Acute embolism and thrombosis of unspecified deep veins of left distal lower extremity: Secondary | ICD-10-CM | POA: Diagnosis not present

## 2021-11-19 DIAGNOSIS — I2699 Other pulmonary embolism without acute cor pulmonale: Secondary | ICD-10-CM

## 2021-11-19 DIAGNOSIS — I10 Essential (primary) hypertension: Secondary | ICD-10-CM

## 2021-11-19 DIAGNOSIS — R351 Nocturia: Secondary | ICD-10-CM | POA: Diagnosis not present

## 2021-11-19 MED ORDER — ESCITALOPRAM OXALATE 10 MG PO TABS: 10.0000 mg | ORAL_TABLET | Freq: Every day | ORAL | 3 refills | Status: DC

## 2021-11-19 MED ORDER — AMLODIPINE BESYLATE 2.5 MG PO TABS: 2.5000 mg | ORAL_TABLET | Freq: Every day | ORAL | 3 refills | Status: DC

## 2021-11-19 MED ORDER — BUSPIRONE HCL 5 MG PO TABS: 5.0000 mg | ORAL_TABLET | Freq: Two times a day (BID) | ORAL | 3 refills | Status: DC | PRN

## 2021-11-19 NOTE — Progress Notes (Signed)
?Phone [phone] ?In person visit ?  ?Subjective:  ? ?William Park is a 71 y.o. year old very pleasant male patient who presents for/with See problem oriented charting ?Chief Complaint  ?Patient presents with  ? Follow-up  ? Hypertension  ? ?This visit occurred during the SARS-CoV-2 public health emergency.  Safety protocols were in place, including screening questions prior to the visit, additional usage of staff PPE, and extensive cleaning of exam room while observing appropriate contact time as indicated for disinfecting solutions.  ? ?Past Medical History-  ?Patient Active Problem List  ? Diagnosis Date Noted  ? Deep vein thrombosis (DVT) of distal vein of left lower extremity ([place]) 07/10/2021  ?  Priority: High  ? Pulmonary embolism ([place]) 05/18/2021  ?  Priority: High  ? CAD (coronary artery disease)   ?  Priority: High  ? Hyperglycemia 05/18/2021  ?  Priority: Medium   ? Alcohol use 05/18/2021  ?  Priority: Medium   ? Spinal stenosis of lumbar region with neurogenic claudication 02/19/2021  ?  Priority: Medium   ? Major depressive disorder with single episode, in full remission ([place]) 05/12/2018  ?  Priority: Medium   ? Hyperlipidemia   ?  Priority: Medium   ? Hypertension   ?  Priority: Medium   ? GERD (gastroesophageal reflux disease)   ?  Priority: Medium   ? Osteoarthritis   ?  Priority: Medium   ? Former smoker 01/06/2015  ?  Priority: Low  ? Allergic rhinitis   ?  Priority: Low  ? Overweight (BMI 25.0-29.9) 05/19/2021  ? Trochanteric bursitis of right hip 04/20/2021  ? Right leg pain 01/30/2021  ? Other chronic pain 01/30/2021  ? Peroneal tendinitis, right 05/18/2018  ? Anemia 08/13/2016  ? ? ?Medications- reviewed and updated ?Current Outpatient Medications  ?Medication Sig Dispense Refill  ? acetaminophen (TYLENOL) 500 MG tablet Take 1,000 mg by mouth every 6 (six) hours as needed for moderate pain.    ? atorvastatin (LIPITOR) 80 MG tablet Take 1 tablet (80 mg total) by mouth daily. 90  tablet 3  ? docusate sodium (COLACE) 100 MG capsule Take 1 capsule (100 mg total) by mouth 2 (two) times daily. (Patient taking differently: Take 100 mg by mouth 2 (two) times daily as needed for mild constipation.) 10 capsule 0  ? ELIQUIS 5 MG TABS tablet TAKE ONE TABLET BY MOUTH TWICE DAILY 74 tablet 0  ? famotidine (PEPCID) 20 MG tablet Take 1 tablet (20 mg total) by mouth 2 (two) times daily. 180 tablet 3  ? fluticasone (FLONASE) 50 MCG/ACT nasal spray Place 2 sprays into both nostrils daily as needed for allergies or rhinitis.    ? losartan (COZAAR) 50 MG tablet Take 1 tablet (50 mg total) by mouth 2 (two) times daily. 180 tablet 3  ? metoprolol tartrate (LOPRESSOR) 25 MG tablet Take 0.5 tablets (12.5 mg total) by mouth 2 (two) times daily. 180 tablet 3  ? omeprazole (PRILOSEC) 40 MG capsule Take 1 capsule (40 mg total) by mouth daily. [place]. 90 capsule 3  ? sildenafil (VIAGRA) 100 MG tablet Take 1 tablet (100 mg total) by mouth daily as needed for erectile dysfunction. 100 tablet 11  ? amLODipine (NORVASC) 2.5 MG tablet Take 1 tablet (2.5 mg total) by mouth daily. 90 tablet 3  ? busPIRone (BUSPAR) 5 MG tablet Take 1 tablet (5 mg total) by mouth 2 (two) times daily as needed (anxiety). 180 tablet 3  ?  escitalopram (LEXAPRO) 10 MG tablet Take 1 tablet (10 mg total) by mouth daily. 90 tablet 3  ? ?No current facility-administered medications for this visit.  ? ?  ?Objective:  ?BP (!) 98/50   Pulse (!) 52   Temp 97.8 ?F (36.6 ?C)   Ht '5\' 11"'$  (1.803 m)   Wt 200 lb (90.7 kg)   SpO2 95%   BMI 27.89 kg/m?  ?Gen: NAD, resting comfortably ?CV: RRR no murmurs rubs or gallops ?Lungs: CTAB no crackles, wheeze, rhonchi ?Abdomen: soft/nontender/nondistended/normal bowel sounds. No rebound or guarding.  ?Ext: no calf pain or edema ?Skin: warm, dry ? ?  ? ?Assessment and Plan  ? ?# social update- not together with Ivin Booty- had been together 28 years.  ?-working hard still ? ?#DVT/PE ?S: Patient  diagnosed with pulmonary embolism on 05/18/2021.  Also diagnosed with DVT.  I was also a 9 x 7 mm nodule right lung base and they recommended considering 40-monthfollow-up to ensure resolution and exclude possibility of underlying pulmonary nodule.  repeat CT on 08/28/2021-showed resolution of previously seen pulmonary embolus as well as resolution of prior pulmonary nodule ?- had surgery a few months prior  but no prolonged travel before vthis ? ?Patient compliant with Eliquis 5 mg twice daily-originally cost was a big concern ?A/P: has been about 6 months- surgeon thought that it was too far out from surgery to be provoked by back surgery- with this not clearly being provoked- opted to get hematology opinion- referred today. For now continue eliquis until they see him - if unprovoked may need long term therapy or further workup ?- down 18 lbs since February and 24 since najuary- he reports working aggressively on healthier eating. Check TSH- doubt malignancy related. .Marland KitchenUp to date on colonoscopy 07/06/2019. No recent PSA_ last 2019- will check to make sure stable (nocturia at least once a night. Quit smoking prior to 1990 and had recent chest ct with no evidence of lung cancer ? ?#CAD  ?#hyperlipidemia ?S: Medication: Atorvastatin 80 mg ?-lipids were really really good- we stopped  Zetia 10 mg ?-Has not been on aspirin while on Eliquis 5 mg twice daily  ?No chest pain or shortness of breath ?Lab Results  ?Component Value Date  ? CHOL 95 07/10/2021  ? HDL 41.60 07/10/2021  ? LHolt36 07/10/2021  ? TRIG 88.0 07/10/2021  ? CHOLHDL 2 07/10/2021  ?A/P: CAD asymptomatic- continue current meds ?-for lipids update LDL on atorvastatin 80 mg alone and off zetia ?  ?#hypertension ?S: medication: Amlodipine 5 mg (Half of 10 mg, losartan 50 mg, Toprol 12.5 mg twice daily ?-Mild intermittent cough on losartan ?Home readings #s: BP has dropped since improving diet and losing some weight- several readings as low as 100s/50s in  recent weeks.   ?BP Readings from Last 3 Encounters:  ?11/19/21 (!) 98/50  ?09/18/21 124/76  ?08/20/21 122/62  ? A/P: blood pressure over controlled after some healthy lifestyle changes- we will reduce amlodipine to 2.5 mg and want him to update me with home blood pressures in 3 weeks.  ?  ?# Depression/anxiety ?S: Medication:lexapro 5 mg. Buspirone 5 mg 3 times daily anxiety ?Therapy/counseling: [place] therapist in [place]- still going ?Thoughts of self-harm: none  ?-no alcohol since february ? ?  11/19/2021  ?  2:43 PM 09/26/2021  ?  2:52 PM 08/20/2021  ?  9:34 AM  ?Depression screen PHQ 2/9  ?Decreased Interest 0 0 0  ?Down, Depressed, Hopeless  0 3 1  ?PHQ - 2 Score 0 3 1  ?Altered sleeping 1 3   ?Tired, decreased energy 0 3 0  ?Change in appetite 0 3 0  ?Feeling bad or failure about yourself  0 2 0  ?Trouble concentrating 0 0 0  ?Moving slowly or fidgety/restless 0 0 0  ?Suicidal thoughts 0 0 0  ?PHQ-9 Score 1 14   ?Difficult doing work/chores Not difficult at all    ?A/P: phq9 is controlled but he still would like to feel somewhat better- we opted to try lexapro 10 mg and cut down buspirone to twice a day.   ?- loss of GF of 28 years with relationship ended big factor ? ?# GERD ?S:Medication: Omeprazole 40 mg daily-also Pepcid up to twice daily ?A/P: Controlled. Continue current medications.   ? ? # Hyperglycemia/insulin resistance/prediabetes ?S: has had some slightly high sugar levels ?No results found for: HGBA1C ?A/P: sugars have been high in past- update a1c to make sure no diabetes  ? ?#allergies- some congestion- adding allegra to flonase somewhat helpful in last week- has ENT visit upcoming ?     ?#History of L4-L5 laminotomy -pain resolved after 6-7 months!  ?- swelling in legs improved off gabapentin ? ?Recommended follow up: Return in about 6 months (around 05/21/2022) for followup or sooner if needed.Schedule b4 you leave. ?Future Appointments  ?Date Time Provider [place]  ?09/02/2022  9:30 AM [facility] HEALTH COACH [facility]  ? ?Lab/Order associations: cereal this morning  ?  ICD-10-CM   ?1. Deep vein thrombosis (DVT) of distal vein of left lower extremity, unspecified chronicity

## 2021-11-19 NOTE — Patient Instructions (Signed)
would like to feel somewhat better- we opted to try lexapro 10 mg and cut down buspirone to twice a day.   ? ?blood pressure over controlled after some healthy lifestyle changes- we will reduce amlodipine to 2.5 mg and want him to update me with home blood pressures in 3 weeks.  ? ?We will call you within two weeks about your referral to hematology about the clots. If you do not hear within 2 weeks, give [place] a call.   ? ?Please stop by lab before you go ?If you have mychart- we will send your results within 3 business days of [place] receiving them.  ?If you do not have mychart- we will call you about results within 5 business days of [place] receiving them.  ?*please also note that you will see labs on mychart as soon as they post. I will later go in and write notes on them- will say "notes from Dr. Yong Channel"  ? ?Recommended follow up: Return in about 6 months (around 05/21/2022) for followup or sooner if needed.Schedule b4 you leave. ?

## 2021-11-20 LAB — COMPREHENSIVE METABOLIC PANEL
ALT: 23 U/L (ref 0–53)
AST: 21 U/L (ref 0–37)
Albumin: 4 g/dL (ref 3.5–5.2)
Alkaline Phosphatase: 86 U/L (ref 39–117)
BUN: 19 mg/dL (ref 6–23)
CO2: 30 mEq/L (ref 19–32)
Calcium: 9 mg/dL (ref 8.4–10.5)
Chloride: 103 mEq/L (ref 96–112)
Creatinine, Ser: 1.03 mg/dL (ref 0.40–1.50)
GFR: 73.15 mL/min (ref >60.00)
Glucose, Bld: 90 mg/dL (ref 70–99)
Potassium: 4.2 mEq/L (ref 3.5–5.1)
Sodium: 141 mEq/L (ref 135–145)
Total Bilirubin: 1 mg/dL (ref 0.2–1.2)
Total Protein: 6.3 g/dL (ref 6.0–8.3)

## 2021-11-20 LAB — CBC WITH DIFFERENTIAL/PLATELET
Basophils Absolute: 0.1 10*3/uL (ref 0.0–0.1)
Basophils Relative: 0.7 % (ref 0.0–3.0)
Eosinophils Absolute: 0.1 10*3/uL (ref 0.0–0.7)
Eosinophils Relative: 0.8 % (ref 0.0–5.0)
HCT: 39.2 % (ref 39.0–52.0)
Hemoglobin: 13.3 g/dL (ref 13.0–17.0)
Lymphocytes Relative: 35.4 % (ref 12.0–46.0)
Lymphs Abs: 2.9 10*3/uL (ref 0.7–4.0)
MCHC: 33.9 g/dL (ref 30.0–36.0)
MCV: 96.2 fl (ref 78.0–100.0)
Monocytes Absolute: 0.6 10*3/uL (ref 0.1–1.0)
Monocytes Relative: 7.4 % (ref 3.0–12.0)
Neutro Abs: 4.5 10*3/uL (ref 1.4–7.7)
Neutrophils Relative %: 55.7 % (ref 43.0–77.0)
Platelets: 132 10*3/uL — ABNORMAL LOW (ref 150.0–400.0)
RBC: 4.08 Mil/uL — ABNORMAL LOW (ref 4.22–5.81)
RDW: 13.6 % (ref 11.5–15.5)
WBC: 8.2 10*3/uL (ref 4.0–10.5)

## 2021-11-20 LAB — TSH: TSH: 0.78 u[IU]/mL (ref 0.35–5.50)

## 2021-11-20 LAB — PSA: PSA: 1.12 ng/mL (ref 0.10–4.00)

## 2021-11-20 LAB — LDL CHOLESTEROL, DIRECT: Direct LDL: 74 mg/dL

## 2021-11-20 LAB — HEMOGLOBIN A1C: Hgb A1c MFr Bld: 6.6 % — ABNORMAL HIGH (ref 4.6–6.5)

## 2021-11-28 ENCOUNTER — Telehealth: Payer: Self-pay

## 2021-11-28 ENCOUNTER — Other Ambulatory Visit: Payer: Self-pay

## 2021-11-28 MED ORDER — METFORMIN HCL 500 MG PO TABS: 500.0000 mg | ORAL_TABLET | Freq: Every day | ORAL | 3 refills | Status: DC

## 2021-11-28 NOTE — Telephone Encounter (Signed)
[facility] ? ?

## 2021-11-28 NOTE — Telephone Encounter (Signed)
Patient has called in inquiring on where Metformin was sent.  Please see recent lab results.  Patient would like script [facility].    Patient moved 4 month appt out to 6 months.

## 2021-11-28 NOTE — Telephone Encounter (Signed)
Called and made pt aware.

## 2021-11-29 ENCOUNTER — Other Ambulatory Visit: Payer: Self-pay | Admitting: Family Medicine

## 2021-12-03 DIAGNOSIS — H9313 Tinnitus, bilateral: Secondary | ICD-10-CM | POA: Diagnosis not present

## 2021-12-03 DIAGNOSIS — H838X3 Other specified diseases of inner ear, bilateral: Secondary | ICD-10-CM | POA: Diagnosis not present

## 2021-12-03 DIAGNOSIS — H903 Sensorineural hearing loss, bilateral: Secondary | ICD-10-CM | POA: Diagnosis not present

## 2021-12-14 NOTE — Progress Notes (Unsigned)
New Hematology/Oncology Consult   Requesting MD: Dr. Garret Reddish  [phone]      Reason for Consult: DVT, PE  HPI: William Park is a 71 year old man who underwent right L4-5 laminectomy/foraminotomies on 02/19/2021.  He had a coronary CT scan 05/08/2021.  He was noted to have filling defects within the right lower lobe pulmonary arterial tree consistent with pulmonary emboli.  CT angio chest on 05/18/2021 showed acute pulmonary embolism in right pulmonary arterial branches with possible right heart strain.  He was hospitalized and started on IV heparin.  Bilateral lower extremity venous Doppler study showed acute deep vein thrombosis involving the left posterior tibial veins; no evidence of a DVT in the right lower extremity.  He was discharged home on Eliquis on 05/20/2021.  Prior to the above, no history of a blood clot.  He denies prolonged immobility following back surgery in July.  No family history of blood clots.     Past Medical History:  Diagnosis Date   Allergic rhinitis    allegra and flonase   CAD (coronary artery disease)    Dr. [name] s/p 4 angioplasty's with last in 74. ASA. simvastatin '80mg'$    GERD (gastroesophageal reflux disease)    nexium '40mg'$  and zantac BID-mout fills with saliva and coughing. Seeing GI in [facility] 12/2014   Hyperlipidemia    simvastatin '80mg'$ , niaspan '750mg'$    Hypertension    losartan '50mg'$    Myocardial infarction ([place])    "small MI, mild"- 1994   Osteoarthritis    mainly hands. Mobic '15mg'$      Past Surgical History:  Procedure Laterality Date   ANGIOPLASTY     x4   COLONOSCOPY     EYE SURGERY     LUMBAR LAMINECTOMY/DECOMPRESSION MICRODISCECTOMY Right 02/19/2021   Procedure: L4-L5 LAMINOTOMY/FORAMINOTOMY;  Surgeon: Newman Pies, MD;  Location: [facility];  Service: Neurosurgery;  Laterality: Right;  3C   pyloric stenosis     as a child   TONSILLECTOMY     UPPER GASTROINTESTINAL ENDOSCOPY    :   Current Outpatient Medications:     acetaminophen (TYLENOL) 500 MG tablet, Take 1,000 mg by mouth every 6 (six) hours as needed for moderate pain., Disp: , Rfl:    amLODipine (NORVASC) 2.5 MG tablet, Take 1 tablet (2.5 mg total) by mouth daily., Disp: 90 tablet, Rfl: 3   atorvastatin (LIPITOR) 80 MG tablet, Take 1 tablet (80 mg total) by mouth daily., Disp: 90 tablet, Rfl: 3   busPIRone (BUSPAR) 5 MG tablet, Take 1 tablet (5 mg total) by mouth 2 (two) times daily as needed (anxiety)., Disp: 180 tablet, Rfl: 3   docusate sodium (COLACE) 100 MG capsule, Take 1 capsule (100 mg total) by mouth 2 (two) times daily. (Patient taking differently: Take 100 mg by mouth 2 (two) times daily as needed for mild constipation.), Disp: 10 capsule, Rfl: 0   escitalopram (LEXAPRO) 10 MG tablet, Take 1 tablet (10 mg total) by mouth daily., Disp: 90 tablet, Rfl: 3   famotidine (PEPCID) 20 MG tablet, Take 1 tablet (20 mg total) by mouth 2 (two) times daily., Disp: 180 tablet, Rfl: 3   fluticasone (FLONASE) 50 MCG/ACT nasal spray, Place 2 sprays into both nostrils daily as needed for allergies or rhinitis., Disp: , Rfl:    losartan (COZAAR) 50 MG tablet, Take 1 tablet (50 mg total) by mouth 2 (two) times daily., Disp: 180 tablet, Rfl: 3   metFORMIN (GLUCOPHAGE) 500 MG tablet, Take 1 tablet (500 mg total) by mouth  daily with breakfast., Disp: 90 tablet, Rfl: 3   metoprolol tartrate (LOPRESSOR) 25 MG tablet, Take 0.5 tablets (12.5 mg total) by mouth 2 (two) times daily., Disp: 180 tablet, Rfl: 3   omeprazole (PRILOSEC) 40 MG capsule, Take 1 capsule (40 mg total) by mouth daily. [place]., Disp: 90 capsule, Rfl: 3   sildenafil (VIAGRA) 100 MG tablet, Take 1 tablet (100 mg total) by mouth daily as needed for erectile dysfunction., Disp: 100 tablet, Rfl: 11   ELIQUIS 5 MG TABS tablet, TAKE ONE TABLET BY MOUTH TWICE DAILY (Patient not taking: Reported on 12/17/2021), Disp: 74 tablet, Rfl: 0:  :  No Known Allergies:  FH: Mother is deceased,  history of breast cancer, lived for 35 years after diagnosis.  Father deceased age 25 with dementia.  No family history of a clotting disorder.  SOCIAL HISTORY: He lives in [place].  He has 2 daughters.  He is a Administrator.  He quit smoking in 1988.  He reports alcohol intake on a daily basis, heavier in the past.  Review of Systems: He feels well overall.  He reports 20 pounds of weight loss, intentional and unintentional.  He attributes some of the weight loss to depression.  He does feel hungry.  He denies pain.  No fevers or sweats.  No unusual headaches.  No visual disturbance.  No cough.  He was experiencing dyspnea on exertion prior to the back surgery in July 2022.  The dyspnea worsened prior to being diagnosed with the PE.  The dyspnea improved during the admission for anticoagulation.  He denies significant dyspnea at present.  No chest pain.  No change in bowel habits.  He reports being up-to-date on screening colonoscopy.  No urinary symptoms.  No hematuria.  He reports being diagnosed with diabetes a few months ago.   Physical Exam:  Blood pressure 127/68, pulse 83, temperature 98.2 F (36.8 C), temperature source Oral, resp. rate 18, height '5\' 11"'$  (1.803 m), weight 199 lb 9.6 oz (90.5 kg), SpO2 100 %.  Lungs: Lungs clear bilaterally. Cardiac: Regular rate and rhythm. Abdomen: No hepatosplenomegaly.  No mass. Vascular: The entire left leg appears larger than the right leg.  Superficial varicosities bilateral lower extremity. Lymph nodes: No palpable cervical, supraclavicular, axillary or inguinal lymph nodes. Neurologic: Alert and oriented.   LABS:  No results for input(s): WBC, HGB, HCT, PLT in the last 72 hours.  No results for input(s): NA, K, CL, CO2, GLUCOSE, BUN, CREATININE, CALCIUM in the last 72 hours.    RADIOLOGY:  No results found.  Assessment and Plan:   Acute PE, left lower extremity DVT October 2022 Weight loss  Recent diagnosis diabetes 02/19/2021  right L4-5 laminectomy/foraminotomies CAD, history of MI Hypertension Depression Alcohol use History of mild thrombocytopenia  William Park was referred for evaluation of PE/DVT October 2022 occurring approximately 3 months following laminectomy/foraminotomies, currently on anticoagulation with Eliquis.  We are obtaining a hypercoagulable panel today, also check D-dimer.  We are referring him for CT imaging of the abdomen/pelvis given weight loss and recent diagnosis of diabetes.  He will return for a follow-up visit in approximately 3 weeks to review results and for additional discussion regarding duration of anticoagulation.  Patient seen with Dr. Benay Spice.    Ned Card, NP 12/17/2021, 3:13 PM   This was a shared visit with Ned Card.  William Park was interviewed and examined.  He was diagnosed with a left lower extremity DVT and pulmonary emboli in  October 2022.  He continues apixaban anticoagulation.  It is unclear whether the venous thrombosis was related to back surgery in late July 2022, another etiology, or unprovoked.  We recommend continuing apixaban anticoagulation for now.  We will obtain additional diagnostic evaluation.  He will return for an office visit and further discussion in 3 weeks. We reviewed the CT images from October 2022.  I was present for greater than 50% of today's visit.  I performed medical decision making.  Julieanne Manson, MD

## 2021-12-17 ENCOUNTER — Inpatient Hospital Stay: Payer: Medicare Other

## 2021-12-17 ENCOUNTER — Encounter: Payer: Self-pay | Admitting: Nurse Practitioner

## 2021-12-17 ENCOUNTER — Inpatient Hospital Stay: Payer: Medicare Other | Attending: Nurse Practitioner | Admitting: Nurse Practitioner

## 2021-12-17 VITALS — BP 127/68 | HR 83 | Temp 98.2°F | Resp 18 | Ht 71.0 in | Wt 199.6 lb

## 2021-12-17 DIAGNOSIS — I2699 Other pulmonary embolism without acute cor pulmonale: Secondary | ICD-10-CM | POA: Insufficient documentation

## 2021-12-17 DIAGNOSIS — I10 Essential (primary) hypertension: Secondary | ICD-10-CM | POA: Diagnosis not present

## 2021-12-17 DIAGNOSIS — Z7901 Long term (current) use of anticoagulants: Secondary | ICD-10-CM | POA: Insufficient documentation

## 2021-12-17 DIAGNOSIS — I824Z2 Acute embolism and thrombosis of unspecified deep veins of left distal lower extremity: Secondary | ICD-10-CM | POA: Insufficient documentation

## 2021-12-17 DIAGNOSIS — K219 Gastro-esophageal reflux disease without esophagitis: Secondary | ICD-10-CM | POA: Diagnosis not present

## 2021-12-17 DIAGNOSIS — Z79899 Other long term (current) drug therapy: Secondary | ICD-10-CM | POA: Insufficient documentation

## 2021-12-17 DIAGNOSIS — E785 Hyperlipidemia, unspecified: Secondary | ICD-10-CM | POA: Diagnosis not present

## 2021-12-17 DIAGNOSIS — I252 Old myocardial infarction: Secondary | ICD-10-CM | POA: Diagnosis not present

## 2021-12-17 DIAGNOSIS — Z87891 Personal history of nicotine dependence: Secondary | ICD-10-CM | POA: Diagnosis not present

## 2021-12-17 DIAGNOSIS — E119 Type 2 diabetes mellitus without complications: Secondary | ICD-10-CM | POA: Insufficient documentation

## 2021-12-17 DIAGNOSIS — R634 Abnormal weight loss: Secondary | ICD-10-CM

## 2021-12-17 DIAGNOSIS — I2510 Atherosclerotic heart disease of native coronary artery without angina pectoris: Secondary | ICD-10-CM | POA: Diagnosis not present

## 2021-12-17 DIAGNOSIS — F32A Depression, unspecified: Secondary | ICD-10-CM | POA: Insufficient documentation

## 2021-12-17 DIAGNOSIS — Z7289 Other problems related to lifestyle: Secondary | ICD-10-CM | POA: Diagnosis not present

## 2021-12-17 LAB — BASIC METABOLIC PANEL - CANCER CENTER ONLY
Anion gap: 11 (ref 5–15)
BUN: 17 mg/dL (ref 8–23)
CO2: 29 mmol/L (ref 22–32)
Calcium: 9.3 mg/dL (ref 8.9–10.3)
Chloride: 101 mmol/L (ref 98–111)
Creatinine: 0.93 mg/dL (ref 0.61–1.24)
GFR, Estimated: >60 mL/min (ref >60)
Glucose, Bld: 99 mg/dL (ref 70–99)
Potassium: 4.2 mmol/L (ref 3.5–5.1)
Sodium: 141 mmol/L (ref 135–145)

## 2021-12-17 LAB — ANTITHROMBIN III: AntiThromb III Func: 92 % (ref 75–120)

## 2021-12-17 LAB — D-DIMER, QUANTITATIVE: D-Dimer, Quant: 0.56 ug/mL-FEU — ABNORMAL HIGH (ref 0.00–0.50)

## 2021-12-20 ENCOUNTER — Ambulatory Visit (HOSPITAL_BASED_OUTPATIENT_CLINIC_OR_DEPARTMENT_OTHER)
Admission: RE | Admit: 2021-12-20 | Discharge: 2021-12-20 | Disposition: A | Discharge status: Home / Self Care | Payer: Medicare Other | Source: Ambulatory Visit | Attending: Nurse Practitioner | Admitting: Nurse Practitioner

## 2021-12-20 ENCOUNTER — Encounter (HOSPITAL_BASED_OUTPATIENT_CLINIC_OR_DEPARTMENT_OTHER): Payer: Self-pay

## 2021-12-20 DIAGNOSIS — I2699 Other pulmonary embolism without acute cor pulmonale: Secondary | ICD-10-CM | POA: Insufficient documentation

## 2021-12-20 DIAGNOSIS — R634 Abnormal weight loss: Secondary | ICD-10-CM | POA: Diagnosis not present

## 2021-12-20 DIAGNOSIS — I824Z2 Acute embolism and thrombosis of unspecified deep veins of left distal lower extremity: Secondary | ICD-10-CM | POA: Diagnosis not present

## 2021-12-20 DIAGNOSIS — K6389 Other specified diseases of intestine: Secondary | ICD-10-CM | POA: Diagnosis not present

## 2021-12-20 MED ORDER — IOHEXOL 300 MG/ML  SOLN
100.0000 mL | Freq: Once | INTRAMUSCULAR | Status: AC | PRN
  Administered 2021-12-20: 100 mL via INTRAVENOUS

## 2021-12-22 ENCOUNTER — Encounter: Payer: Self-pay | Admitting: Family Medicine

## 2021-12-26 LAB — BETA-2-GLYCOPROTEIN I ABS, IGG/M/A
Beta-2 Glyco I IgG: <9 GPI IgG units (ref 0–20)
Beta-2-Glycoprotein I IgA: <9 GPI IgA units (ref 0–25)
Beta-2-Glycoprotein I IgM: <9 GPI IgM units (ref 0–32)

## 2021-12-26 LAB — PROTHROMBIN GENE MUTATION

## 2021-12-26 LAB — CARDIOLIPIN ANTIBODIES, IGG, IGM, IGA
Anticardiolipin IgA: <9 APL U/mL (ref 0–11)
Anticardiolipin IgG: <9 GPL U/mL (ref 0–14)
Anticardiolipin IgM: <9 MPL U/mL (ref 0–12)

## 2021-12-26 LAB — FACTOR 5 LEIDEN

## 2021-12-26 LAB — PROTEIN C, TOTAL: Protein C, Total: 98 % (ref 60–150)

## 2021-12-26 LAB — PROTEIN C ACTIVITY: Protein C Activity: 99 % (ref 73–180)

## 2021-12-26 LAB — PROTEIN S, TOTAL: Protein S Ag, Total: 106 % (ref 60–150)

## 2021-12-26 LAB — PROTEIN S ACTIVITY: Protein S Activity: 93 % (ref 63–140)

## 2021-12-31 ENCOUNTER — Telehealth: Payer: Self-pay | Admitting: Pharmacist

## 2021-12-31 NOTE — Progress Notes (Addendum)
Chronic Care Management Pharmacy Assistant   Name: William Park  MRN: [id number] DOB: 1951/02/18   Reason for Encounter: Medication Coordination Call    Recent office visits:  11/19/2021 [facility] (PCP) Shelva Majestic, MD; no medication changes indicated.  Recent consult visits:  12/17/2021 OV (Oncology) Lesly Rubenstein, NP; no medication changes indicated.  Hospital visits:  None in previous 6 months  Medications: Outpatient Encounter Medications as of 12/31/2021  Medication Sig   acetaminophen (TYLENOL) 500 MG tablet Take 1,000 mg by mouth every 6 (six) hours as needed for moderate pain.   amLODipine (NORVASC) 2.5 MG tablet Take 1 tablet (2.5 mg total) by mouth daily.   atorvastatin (LIPITOR) 80 MG tablet Take 1 tablet (80 mg total) by mouth daily.   busPIRone (BUSPAR) 5 MG tablet Take 1 tablet (5 mg total) by mouth 2 (two) times daily as needed (anxiety).   docusate sodium (COLACE) 100 MG capsule Take 1 capsule (100 mg total) by mouth 2 (two) times daily. (Patient taking differently: Take 100 mg by mouth 2 (two) times daily as needed for mild constipation.)   ELIQUIS 5 MG TABS tablet TAKE ONE TABLET BY MOUTH TWICE DAILY (Patient not taking: Reported on 12/17/2021)   escitalopram (LEXAPRO) 10 MG tablet Take 1 tablet (10 mg total) by mouth daily.   famotidine (PEPCID) 20 MG tablet Take 1 tablet (20 mg total) by mouth 2 (two) times daily.   fluticasone (FLONASE) 50 MCG/ACT nasal spray Place 2 sprays into both nostrils daily as needed for allergies or rhinitis.   losartan (COZAAR) 50 MG tablet Take 1 tablet (50 mg total) by mouth 2 (two) times daily.   metFORMIN (GLUCOPHAGE) 500 MG tablet Take 1 tablet (500 mg total) by mouth daily with breakfast.   metoprolol tartrate (LOPRESSOR) 25 MG tablet Take 0.5 tablets (12.5 mg total) by mouth 2 (two) times daily.   omeprazole (PRILOSEC) 40 MG capsule Take 1 capsule (40 mg total) by mouth daily. 30 MINTUES BEFORE BREAKFAST.   sildenafil  (VIAGRA) 100 MG tablet Take 1 tablet (100 mg total) by mouth daily as needed for erectile dysfunction.   No facility-administered encounter medications on file as of 12/31/2021.   Reviewed chart for medication changes ahead of medication coordination call.  No medication changes indicated.  BP Readings from Last 3 Encounters:  12/17/21 127/68  11/19/21 (!) 98/50  09/18/21 124/76    Lab Results  Component Value Date   HGBA1C 6.6 (H) 11/19/2021     Patient obtains medications through Vials  90 Days   Last adherence delivery included:  Amlodipine 5 mg 1 tablet daily - requested new Rx Omeprazole 40 mg once a day Losartan Potassium 50 mg twice a day - requested new Rx Famotidine 20 mg twice daily Buspirone 5 mg three times daily as needed Metoprolol Tartrate 12.5 mg twice daily Atorvastatin 80 mg once a day Eliquis 5 mg (30 DS only) - patient states this medication may be discontinued soon Escitalopram 5 mg (30 DS only) - patient states this medication may change so he only wants a 30 DS  Patient is due for next adherence delivery on: 01/10/2022. Called patient and reviewed medications and coordinated delivery.  This delivery to include: Amlodipine 5 mg 1 tablet daily - requested new Rx Omeprazole 40 mg once a day Losartan Potassium 50 mg twice a day - requested new Rx Famotidine 20 mg twice daily Buspirone 5 mg three times daily as needed Metoprolol Tartrate 12.5  mg twice daily - break in half Atorvastatin 80 mg once a day Escitalopram 10 mg once daily Ezetimibe 10 mg daily Metformin 500 mg every morning Flonase nasal spray  Confirmed delivery date of 01/10/2022, advised patient that pharmacy will contact them the morning of delivery.  Care Gaps: Medicare Annual Wellness: Completed 08/20/2021 Hemoglobin A1C: 6.6% on 11/19/2021 Colonoscopy: Next due on 07/05/2029  Future Appointments  Date Time Provider Department Center  01/15/2022  8:20 AM Ladene Artist, MD [facility]  None  04/15/2022  2:00 PM [facility] CCM PHARMACIST [facility]  05/13/2022  1:20 PM Shelva Majestic, MD [facility]  09/02/2022  9:30 AM [facility] HEALTH COACH [facility]   April D Calhoun, [facility] Clinical Pharmacist Assistant [phone]

## 2022-01-03 ENCOUNTER — Other Ambulatory Visit: Payer: Self-pay | Admitting: Family Medicine

## 2022-01-15 ENCOUNTER — Inpatient Hospital Stay: Payer: Medicare Other | Attending: Nurse Practitioner | Admitting: Oncology

## 2022-01-15 ENCOUNTER — Telehealth: Payer: Self-pay

## 2022-01-15 VITALS — BP 118/66 | HR 55 | Temp 97.9°F | Resp 18 | Ht 71.0 in | Wt 198.6 lb

## 2022-01-15 DIAGNOSIS — I2699 Other pulmonary embolism without acute cor pulmonale: Secondary | ICD-10-CM | POA: Diagnosis not present

## 2022-01-15 DIAGNOSIS — I2510 Atherosclerotic heart disease of native coronary artery without angina pectoris: Secondary | ICD-10-CM | POA: Diagnosis not present

## 2022-01-15 DIAGNOSIS — I10 Essential (primary) hypertension: Secondary | ICD-10-CM | POA: Diagnosis not present

## 2022-01-15 DIAGNOSIS — D6851 Activated protein C resistance: Secondary | ICD-10-CM | POA: Insufficient documentation

## 2022-01-15 DIAGNOSIS — F32A Depression, unspecified: Secondary | ICD-10-CM | POA: Insufficient documentation

## 2022-01-15 DIAGNOSIS — R634 Abnormal weight loss: Secondary | ICD-10-CM | POA: Insufficient documentation

## 2022-01-15 DIAGNOSIS — E119 Type 2 diabetes mellitus without complications: Secondary | ICD-10-CM | POA: Insufficient documentation

## 2022-01-15 DIAGNOSIS — K648 Other hemorrhoids: Secondary | ICD-10-CM | POA: Diagnosis not present

## 2022-01-15 DIAGNOSIS — I252 Old myocardial infarction: Secondary | ICD-10-CM | POA: Diagnosis not present

## 2022-01-15 DIAGNOSIS — Z79899 Other long term (current) drug therapy: Secondary | ICD-10-CM | POA: Insufficient documentation

## 2022-01-15 DIAGNOSIS — Z7289 Other problems related to lifestyle: Secondary | ICD-10-CM | POA: Diagnosis not present

## 2022-01-15 DIAGNOSIS — Z86718 Personal history of other venous thrombosis and embolism: Secondary | ICD-10-CM | POA: Insufficient documentation

## 2022-01-15 DIAGNOSIS — I824Z2 Acute embolism and thrombosis of unspecified deep veins of left distal lower extremity: Secondary | ICD-10-CM | POA: Diagnosis not present

## 2022-01-15 NOTE — Telephone Encounter (Signed)
Per Dr Silverio Decamp, the patient needs an appointment to discuss and be scheduled for a colonoscopy.  Called the patient to schedule. Scheduled the patient for 02/07/22 at 11:00 am with APP. Asked he call back and accept or change the appointment.

## 2022-01-15 NOTE — Progress Notes (Signed)
  [place] OFFICE PROGRESS NOTE   Diagnosis: DVT/pulmonary embolism  INTERVAL HISTORY:   [name] returns as scheduled.  He continues apixaban anticoagulation.  No bleeding or symptom of recurrent thrombosis he feels well.  Good appetite.  He relates weight loss to changing his diet.  No difficulty with bowel function.  Objective:  Vital signs in last 24 hours:  Blood pressure 118/66, pulse (!) 55, temperature 97.9 F (36.6 C), temperature source Oral, resp. rate 18, height '5\' 11"'$  (1.803 m), weight 198 lb 9.6 oz (90.1 kg), SpO2 98 %.    Lymphatics: No cervical, supraclavicular, axillary, or inguinal nodes Resp: Lungs clear bilaterally Cardio: Regular rate and rhythm GI: No hepatosplenomegaly, no mass, nontender Vascular: The left lower leg is larger than the right side  Lab Results:  Lab Results  Component Value Date   WBC 8.2 11/19/2021   HGB 13.3 11/19/2021   HCT 39.2 11/19/2021   MCV 96.2 11/19/2021   PLT 132.0 (L) 11/19/2021   NEUTROABS 4.5 11/19/2021    CMP  Lab Results  Component Value Date   NA 141 12/17/2021   K 4.2 12/17/2021   CL 101 12/17/2021   CO2 29 12/17/2021   GLUCOSE 99 12/17/2021   BUN 17 12/17/2021   CREATININE 0.93 12/17/2021   CALCIUM 9.3 12/17/2021   PROT 6.3 11/19/2021   ALBUMIN 4.0 11/19/2021   AST 21 11/19/2021   ALT 23 11/19/2021   ALKPHOS 86 11/19/2021   BILITOT 1.0 11/19/2021   GFRNONAA >60 12/17/2021   GFRAA 85 04/10/2020    Medications: I have reviewed the patient's current medications.   Assessment/Plan: Acute PE, left lower extremity DVT October 2022 Factor V Leiden heterozygote Weight loss  Recent diagnosis diabetes 02/19/2021 right L4-5 laminectomy/foraminotomies CAD, history of MI Hypertension Depression Alcohol use History of mild thrombocytopenia CT abdomen/pelvis 12/20/2021-subtle wall thickening of the cecal tip at the appendiceal orifice Colonoscopy 07/06/2019-diverticuli, internal  hemorrhoids    Disposition: [name] appears stable.  He had a DVT and pulmonary embolism in October 2022.  He is a factor V Leiden heterozygote.  No other clear predisposing risk factor for DVT.  He continues apixaban anticoagulation.  The D-dimer was elevated last month.  I recommend he continue apixaban for now.  He will return for an office visit in 2 months.  We will discuss reduced intensity anticoagulation at the next office visit.  I will contact Dr. Silverio Decamp regarding the CT finding of the appendix.  Betsy Coder, MD  01/15/2022  8:37 AM

## 2022-01-16 NOTE — Telephone Encounter (Signed)
Patient called back and re-scheduled the appointment.

## 2022-01-23 ENCOUNTER — Other Ambulatory Visit: Payer: Self-pay

## 2022-01-23 NOTE — Progress Notes (Signed)
The proposed treatment discussed in conference is for discussion purpose only and is not a binding recommendation.  The patients have not been physically examined, or presented with their treatment options.  Therefore, final treatment plans cannot be decided.  

## 2022-02-07 ENCOUNTER — Ambulatory Visit: Payer: Medicare Other | Admitting: Physician Assistant

## 2022-02-11 ENCOUNTER — Encounter: Payer: Self-pay | Admitting: Physician Assistant

## 2022-02-11 ENCOUNTER — Telehealth: Payer: Self-pay | Admitting: *Deleted

## 2022-02-11 ENCOUNTER — Ambulatory Visit (INDEPENDENT_AMBULATORY_CARE_PROVIDER_SITE_OTHER): Payer: Medicare Other | Admitting: Physician Assistant

## 2022-02-11 VITALS — BP 126/70 | HR 51 | Ht 71.0 in | Wt 204.4 lb

## 2022-02-11 DIAGNOSIS — Z7901 Long term (current) use of anticoagulants: Secondary | ICD-10-CM

## 2022-02-11 DIAGNOSIS — R9389 Abnormal findings on diagnostic imaging of other specified body structures: Secondary | ICD-10-CM

## 2022-02-11 DIAGNOSIS — R634 Abnormal weight loss: Secondary | ICD-10-CM | POA: Diagnosis not present

## 2022-02-11 DIAGNOSIS — Z86718 Personal history of other venous thrombosis and embolism: Secondary | ICD-10-CM

## 2022-02-11 NOTE — Telephone Encounter (Signed)
Forwarded to pts PCP today  William Park

## 2022-02-11 NOTE — Telephone Encounter (Signed)
Dr. Benay Spice- I was going to have him hold for 2 days as requested for colonoscopy. Just wanted to make sure you didn't have a different opinion on this with his factor V leiden- thanks for weighing in

## 2022-02-11 NOTE — Telephone Encounter (Signed)
[facility] Pre-operative Risk Assessment     Request for surgical clearance:     Endoscopy Procedure  What type of surgery is being performed?    Colonoscopy   When is this surgery scheduled?      02/25/2022 at 9am    What type of clearance is required ?   Pharmacy  Are there any medications that need to be held prior to surgery and how long? Eliquis  2 days   Practice name and name of physician performing surgery?      [facility]  Dr Silverio Decamp  What is your office phone and fax number?      Phone- [phone]  [phone]  Anesthesia type (None, local, MAC, general) ?       MAC

## 2022-02-11 NOTE — Patient Instructions (Signed)
You have been scheduled for a colonoscopy. Please follow written instructions given to you at your visit today.  Please pick up your prep supplies at the pharmacy within the next 1-3 days. If you use inhalers (even only as needed), please bring them with you on the day of your procedure.   You will be contacted by our office prior to your procedure for directions on holding your Eliquis.  If you do not hear from our office 1 week prior to your scheduled procedure, please call [phone] to discuss.   Follow up per recommendations after colonoscopy  Due to recent changes in healthcare laws, you may see the results of your imaging and laboratory studies on MyChart before your provider has had a chance to review them.  We understand that in some cases there may be results that are confusing or concerning to you. Not all laboratory results come back in the same time frame and the provider may be waiting for multiple results in order to interpret others.  Please give [place] 48 hours in order for your provider to thoroughly review all the results before contacting the office for clarification of your results.    I appreciate the  opportunity to care for you  Thank You   Lovett Calender

## 2022-02-11 NOTE — Progress Notes (Signed)
Chief Complaint: Abnormal CT of the abdomen  HPI:    [name] is a 71 year old male with a past medical history of CAD and DVT on Eliquis, GERD, MI and multiple others listed below, known to Dr. Silverio Decamp, who was referred to me by Marin Olp, MD for a complaint of abnormal CT of the abdomen.      07/06/2019 colonoscopy with diverticulosis in the sigmoid, descending and ascending colon nonbleeding internal hemorrhoids.  Exam otherwise normal.  Repeat recommended in 10 years.    07/06/2019 EGD with esophageal stenosis dilated, gastritis and otherwise normal.    12/20/2021 CT of the abdomen pelvis with contrast showed subtle focal wall thickening in the cecal tip at the appendiceal orifice.  Correlation recommended with colonoscopy.    Today, patient presents to clinic and tells me he recently had a CT done of his abdomen given that he had lost about 25 pounds in a month to a month and a half.  Tells me he was trying to cut back on his food and really was not eating a lot as he was very stressed at the time, but his PCP was still concerned.  CT as above.  Tells me he has not noticed any change in bowel habits, abdominal pain, nausea or vomiting.    Denies fever or chills.  Past Surgical History:  Procedure Laterality Date   ANGIOPLASTY     x4   COLONOSCOPY     EYE SURGERY     LUMBAR LAMINECTOMY/DECOMPRESSION MICRODISCECTOMY Right 02/19/2021   Procedure: L4-L5 LAMINOTOMY/FORAMINOTOMY;  Surgeon: Newman Pies, MD;  Location: [facility];  Service: Neurosurgery;  Laterality: Right;  3C   pyloric stenosis     as a child   TONSILLECTOMY     UPPER GASTROINTESTINAL ENDOSCOPY      Current Outpatient Medications  Medication Sig Dispense Refill   acetaminophen (TYLENOL) 500 MG tablet Take 1,000 mg by mouth every 6 (six) hours as needed for moderate pain.     amLODipine (NORVASC) 2.5 MG tablet Take 1 tablet (2.5 mg total) by mouth daily. 90 tablet 3   atorvastatin (LIPITOR) 80 MG tablet Take 1  tablet (80 mg total) by mouth daily. 90 tablet 3   busPIRone (BUSPAR) 5 MG tablet Take 1 tablet (5 mg total) by mouth 2 (two) times daily as needed (anxiety). 180 tablet 3   docusate sodium (COLACE) 100 MG capsule Take 1 capsule (100 mg total) by mouth 2 (two) times daily. 10 capsule 0   ELIQUIS 5 MG TABS tablet TAKE ONE TABLET BY MOUTH TWICE DAILY 74 tablet 0   escitalopram (LEXAPRO) 10 MG tablet Take 1 tablet (10 mg total) by mouth daily. 90 tablet 3   ezetimibe (ZETIA) 10 MG tablet Take 10 mg by mouth daily.     famotidine (PEPCID) 20 MG tablet Take 1 tablet (20 mg total) by mouth 2 (two) times daily. 180 tablet 3   fluticasone (FLONASE) 50 MCG/ACT nasal spray USE TWO SPRAYS in each nare ONCE DAILY 48 g 0   losartan (COZAAR) 50 MG tablet Take 1 tablet (50 mg total) by mouth 2 (two) times daily. 180 tablet 3   metFORMIN (GLUCOPHAGE) 500 MG tablet Take 1 tablet (500 mg total) by mouth daily with breakfast. 90 tablet 3   metoprolol tartrate (LOPRESSOR) 25 MG tablet Take 0.5 tablets (12.5 mg total) by mouth 2 (two) times daily. 180 tablet 3   omeprazole (PRILOSEC) 40 MG capsule Take 1 capsule (40 mg  total) by mouth daily. [place]. 90 capsule 3   sildenafil (VIAGRA) 100 MG tablet Take 1 tablet (100 mg total) by mouth daily as needed for erectile dysfunction. 100 tablet 11   No current facility-administered medications for this visit.    Allergies as of 02/11/2022   (No Known Allergies)    Family History  Problem Relation Age of Onset   Dementia Father        passed age 55   Kidney disease Father        CKD   CAD Father    AAA (abdominal aortic aneurysm) Father    Breast cancer Mother    Congestive Heart Failure Mother        81 time of death   CAD Brother    Colon cancer Neg Hx    Esophageal cancer Neg Hx    Stomach cancer Neg Hx    Rectal cancer Neg Hx     Social History   Socioeconomic History   Marital status: Divorced    Spouse name: Not on file    Number of children: Not on file   Years of education: Not on file   Highest education level: Not on file  Occupational History   Not on file  Tobacco Use   Smoking status: Former    Packs/day: 2.00    Years: 25.00    Total pack years: 50.00    Types: Cigarettes    Quit date: 07/30/1987    Years since quitting: 34.5   Smokeless tobacco: Never  Vaping Use   Vaping Use: Never used  Substance and Sexual Activity   Alcohol use: Yes   Drug use: No   Sexual activity: Not on file  Other Topics Concern   Not on file  Social History Narrative   Family: Divorced. 2 daughters. 3 granddaughters. Doesn't get to see them.       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   Social Determinants of Radio broadcast assistant Strain: Low Risk  (08/20/2021)   Overall Financial Resource Strain (CARDIA)    Difficulty of Paying Living Expenses: Not hard at all  Food Insecurity: No Food Insecurity (08/20/2021)   Hunger Vital Sign    Worried About Running Out of Food in the Last Year: Never true    Ran Out of Food in the Last Year: Never true  Transportation Needs: No Transportation Needs (08/20/2021)   PRAPARE - Hydrologist (Medical): No    Lack of Transportation (Non-Medical): No  Physical Activity: Inactive (08/20/2021)   Exercise Vital Sign    Days of Exercise per Week: 0 days    Minutes of Exercise per Session: 0 min  Stress: Stress Concern Present (08/20/2021)   [place]    Feeling of Stress : To some extent  Social Connections: Moderately Isolated (08/20/2021)   Social Connection and Isolation Panel [NHANES]    Frequency of Communication with Friends and Family: Twice a week    Frequency of Social Gatherings with Friends and Family: Once a week    Attends Religious Services: 1 to 4 times per year    Active Member of [employer] or Organizations: No     Attends Archivist Meetings: Never    Marital Status: Divorced  Human resources officer Violence: Not At Risk (08/20/2021)   Humiliation, Afraid, Rape, and  Kick questionnaire    Fear of Current or Ex-Partner: No    Emotionally Abused: No    Physically Abused: No    Sexually Abused: No    Review of Systems:    Constitutional: No weight loss, fever or chills Skin: No rash Cardiovascular: No chest pain  Respiratory: No SOB  Gastrointestinal: See HPI and otherwise negative Genitourinary: No dysuria  Neurological: No headache, dizziness or syncope Musculoskeletal: No new muscle or joint pain Hematologic: No bleeding Psychiatric: No history of depression or anxiety   Physical Exam:  Vital signs: BP 126/70   Pulse (!) 51   Ht '5\' 11"'$  (1.803 m)   Wt 204 lb 6.4 oz (92.7 kg)   SpO2 96%   BMI 28.51 kg/m    Constitutional:   Pleasant Caucasian male appears to be in NAD, Well developed, Well nourished, alert and cooperative Respiratory: Respirations even and unlabored. Lungs clear to auscultation bilaterally.   No wheezes, crackles, or rhonchi.  Cardiovascular: Normal S1, S2. No MRG. Regular rate and rhythm. No peripheral edema, cyanosis or pallor.  Gastrointestinal:  Soft, nondistended, nontender. No rebound or guarding. Normal bowel sounds. No appreciable masses or hepatomegaly. Rectal:  Not performed.  Psychiatric: Demonstrates good judgement and reason without abnormal affect or behaviors.  RELEVANT LABS AND IMAGING: CBC    Component Value Date/Time   WBC 8.2 11/19/2021 1507   RBC 4.08 (L) 11/19/2021 1507   HGB 13.3 11/19/2021 1507   HGB 15.5 05/01/2021 1207   HCT 39.2 11/19/2021 1507   HCT 44.8 05/01/2021 1207   PLT 132.0 (L) 11/19/2021 1507   PLT 163 05/01/2021 1207   MCV 96.2 11/19/2021 1507   MCV 96 05/01/2021 1207   MCH 33.1 05/19/2021 0530   MCHC 33.9 11/19/2021 1507   RDW 13.6 11/19/2021 1507   RDW 13.3 05/01/2021 1207   LYMPHSABS 2.9 11/19/2021 1507   MONOABS  0.6 11/19/2021 1507   EOSABS 0.1 11/19/2021 1507   BASOSABS 0.1 11/19/2021 1507    CMP     Component Value Date/Time   NA 141 12/17/2021 1559   NA 144 05/01/2021 1207   K 4.2 12/17/2021 1559   CL 101 12/17/2021 1559   CO2 29 12/17/2021 1559   GLUCOSE 99 12/17/2021 1559   BUN 17 12/17/2021 1559   BUN 16 05/01/2021 1207   CREATININE 0.93 12/17/2021 1559   CREATININE 1.03 04/10/2020 1516   CALCIUM 9.3 12/17/2021 1559   PROT 6.3 11/19/2021 1507   PROT 7.5 05/01/2021 1207   ALBUMIN 4.0 11/19/2021 1507   ALBUMIN 4.6 05/01/2021 1207   AST 21 11/19/2021 1507   ALT 23 11/19/2021 1507   ALKPHOS 86 11/19/2021 1507   BILITOT 1.0 11/19/2021 1507   BILITOT 1.4 (H) 05/01/2021 1207   GFRNONAA >60 12/17/2021 1559   GFRNONAA 74 04/10/2020 1516   GFRAA 85 04/10/2020 1516    Assessment: 1.  Abnormal CT of the abdomen: CT showing focal wall thickening in the appendiceal orifice, last colonoscopy in 2020 was normal; consider inflammation versus imaging flaws versus cancer 2.  Weight loss: Patient was cutting back on food but lost about 25 pounds in a month to month and a half  Plan: 1.  Scheduled patient for diagnostic colonoscopy in the [place] given his abnormal CT of the abdomen.  This is scheduled with Dr. Silverio Decamp.  Did provide the patient a detailed list of risks for the procedure and he agrees to proceed. Patient is appropriate for endoscopic procedure(s) in the ambulatory (  [facility]) setting.  2.  Advised patient to hold his Eliquis for 2 days prior to time of procedure.  We will communicate with his prescribing physician to ensure this is acceptable for him. 3.  Patient follow in clinic per recommendations after time of procedure  Ellouise Newer, PA-C [facility] 02/11/2022, 10:06 AM  Cc: Marin Olp, MD

## 2022-02-11 NOTE — Telephone Encounter (Signed)
Pt takes Eliquis for hx of DVT/PE. This is being prescribed and managed by PCP, recommend they provide clearance recs.

## 2022-02-13 NOTE — Telephone Encounter (Signed)
Yes thanks- may hold  eliquis for 2 days prior to colonoscopy

## 2022-02-14 NOTE — Telephone Encounter (Signed)
Called patient and he is aware to hold Eliquis 2 days before procedure

## 2022-02-25 ENCOUNTER — Encounter: Payer: Self-pay | Admitting: Gastroenterology

## 2022-02-25 ENCOUNTER — Ambulatory Visit (AMBULATORY_SURGERY_CENTER): Payer: Medicare Other | Admitting: Gastroenterology

## 2022-02-25 VITALS — BP 100/70 | HR 50 | Temp 98.9°F | Resp 13 | Ht 71.0 in | Wt 204.0 lb

## 2022-02-25 DIAGNOSIS — D123 Benign neoplasm of transverse colon: Secondary | ICD-10-CM | POA: Diagnosis not present

## 2022-02-25 DIAGNOSIS — R933 Abnormal findings on diagnostic imaging of other parts of digestive tract: Secondary | ICD-10-CM | POA: Diagnosis not present

## 2022-02-25 DIAGNOSIS — R9389 Abnormal findings on diagnostic imaging of other specified body structures: Secondary | ICD-10-CM

## 2022-02-25 DIAGNOSIS — K648 Other hemorrhoids: Secondary | ICD-10-CM | POA: Diagnosis not present

## 2022-02-25 DIAGNOSIS — K5730 Diverticulosis of large intestine without perforation or abscess without bleeding: Secondary | ICD-10-CM

## 2022-02-25 MED ORDER — SODIUM CHLORIDE 0.9 % IV SOLN: 500.0000 mL | Freq: Once | INTRAVENOUS | Status: DC

## 2022-02-25 NOTE — Progress Notes (Signed)
Pt's states no medical or surgical changes since previsit or office visit. VS assessed by C.W 

## 2022-02-25 NOTE — Progress Notes (Signed)
Please refer to office visit note 02/11/22. No additional changes in H&P Patient is appropriate for planned procedure(s) and anesthesia in an ambulatory setting  K. Denzil Magnuson , MD [phone]

## 2022-02-25 NOTE — Patient Instructions (Signed)
Await pathology results.  Resume Eliquis at prior dose tomorrow.  YOU HAD AN ENDOSCOPIC PROCEDURE TODAY AT [facility]:   Refer to the procedure report that was given to you for any specific questions about what was found during the examination.  If the procedure report does not answer your questions, please call your gastroenterologist to clarify.  If you requested that your care partner not be given the details of your procedure findings, then the procedure report has been included in a sealed envelope for you to review at your convenience later.  YOU SHOULD EXPECT: Some feelings of bloating in the abdomen. Passage of more gas than usual.  Walking can help get rid of the air that was put into your GI tract during the procedure and reduce the bloating. If you had a lower endoscopy (such as a colonoscopy or flexible sigmoidoscopy) you may notice spotting of blood in your stool or on the toilet paper. If you underwent a bowel prep for your procedure, you may not have a normal bowel movement for a few days.  Please Note:  You might notice some irritation and congestion in your nose or some drainage.  This is from the oxygen used during your procedure.  There is no need for concern and it should clear up in a day or so.  SYMPTOMS TO REPORT IMMEDIATELY:  Following lower endoscopy (colonoscopy or flexible sigmoidoscopy):  Excessive amounts of blood in the stool  Significant tenderness or worsening of abdominal pains  Swelling of the abdomen that is new, acute  Fever of 100F or higher  For urgent or emergent issues, a gastroenterologist can be reached at any hour by calling [phone]. Do not use MyChart messaging for urgent concerns.    DIET:  We do recommend a small meal at first, but then you may proceed to your regular diet.  Drink plenty of fluids but you should avoid alcoholic beverages for 24 hours.  ACTIVITY:  You should plan to take it easy for the rest of today and  you should NOT DRIVE or use heavy machinery until tomorrow (because of the sedation medicines used during the test).    FOLLOW UP: Our staff will call the number listed on your records the next business day following your procedure.  We will call around 7:15- 8:00 am to check on you and address any questions or concerns that you may have regarding the information given to you following your procedure. If we do not reach you, we will leave a message.  If you develop any symptoms (ie: fever, flu-like symptoms, shortness of breath, cough etc.) before then, please call [phone].  If you test positive for Covid 19 in the 2 weeks post procedure, please call and report this information to [place].    If any biopsies were taken you will be contacted by phone or by letter within the next 1-3 weeks.  Please call us at [phone] if you have not heard about the biopsies in 3 weeks.    SIGNATURES/CONFIDENTIALITY: You and/or your care partner have signed paperwork which will be entered into your electronic medical record.  These signatures attest to the fact that that the information above on your After Visit Summary has been reviewed and is understood.  Full responsibility of the confidentiality of this discharge information lies with you and/or your care-partner.

## 2022-02-25 NOTE — Progress Notes (Signed)
To pacu, VSS. Report to [email or web address] 

## 2022-02-25 NOTE — Progress Notes (Signed)
Called to room to assist during endoscopic procedure.  Patient ID and intended procedure confirmed with present staff. Received instructions for my participation in the procedure from the performing physician.  

## 2022-02-25 NOTE — Op Note (Addendum)
[place] Patient Name: William Park [facility] Procedure Date: 02/25/2022 9:02 AM MRN: [id number] Endoscopist: Mauri Pole , MD Age: 71 Referring MD:  Date of Birth: 1950/09/22 Gender: Male Account #: [id number] Procedure:                Colonoscopy Indications:              Abnormal CT of the GI tract Medicines:                Monitored Anesthesia Care Procedure:                Pre-Anesthesia Assessment:                           - Prior to the procedure, a History and Physical                            was performed, and patient medications and                            allergies were reviewed. The patient's tolerance of                            previous anesthesia was also reviewed. The risks                            and benefits of the procedure and the sedation                            options and risks were discussed with the patient.                            All questions were answered, and informed consent                            was obtained. Prior Anticoagulants: The patient                            last took Eliquis (apixaban) 2 days prior to the                            procedure. ASA Grade Assessment: III - A patient                            with severe systemic disease. After reviewing the                            risks and benefits, the patient was deemed in                            satisfactory condition to undergo the procedure.                           After obtaining informed consent, the colonoscope  was passed under direct vision. Throughout the                            procedure, the patient's blood pressure, pulse, and                            oxygen saturations were monitored continuously. The                            Olympus PCF-H190DL (#[id number]) Colonoscope was                            introduced through the anus and advanced to the the                            cecum, identified by  appendiceal orifice and                            ileocecal valve. The colonoscopy was performed                            without difficulty. The patient tolerated the                            procedure well. The quality of the bowel                            preparation was good. The ileocecal valve,                            appendiceal orifice, and rectum were photographed. Scope In: 9:23:14 AM Scope Out: 9:41:23 AM Scope Withdrawal Time: 0 hours 15 minutes 23 seconds  Total Procedure Duration: 0 hours 18 minutes 9 seconds  Findings:                 The perianal and digital rectal examinations were                            normal.                           Three sessile polyps were found in the transverse                            colon. The polyps were 6 to 10 mm in size. These                            polyps were removed with a cold snare. Resection                            and retrieval were complete.                           Multiple small and large-mouthed diverticula were  found in the sigmoid colon, descending colon,                            transverse colon and ascending colon.                            Peri-diverticular erythema was seen. There was                            evidence of an impacted diverticulum.                           Non-bleeding internal hemorrhoids were found during                            retroflexion. The hemorrhoids were large.                           The exam was otherwise without abnormality with no                            polypoid lesion or thickening in the cecum. Complications:            No immediate complications. Estimated Blood Loss:     Estimated blood loss was minimal. Impression:               - Three 6 to 10 mm polyps in the transverse colon,                            removed with a cold snare. Resected and retrieved.                           - Moderate diverticulosis in the sigmoid  colon, in                            the descending colon, in the transverse colon and                            in the ascending colon. Peri-diverticular erythema                            was seen. There was evidence of an impacted                            diverticulum.                           - Non-bleeding internal hemorrhoids.                           - The examination was otherwise normal. Recommendation:           - Patient has a contact number available for                            emergencies. The  signs and symptoms of potential                            delayed complications were discussed with the                            patient. Return to normal activities tomorrow.                            Written discharge instructions were provided to the                            patient.                           - Resume previous diet.                           - Continue present medications.                           - Await pathology results.                           - Repeat colonoscopy in 3 - 5 years for                            surveillance based on pathology results.                           - Resume Eliquis (apixaban) at prior dose tomorrow.                            Refer to managing physician for further adjustment                            of therapy. Mauri Pole, MD 02/25/2022 9:54:26 AM This report has been signed electronically.

## 2022-02-26 ENCOUNTER — Telehealth: Payer: Self-pay

## 2022-02-26 NOTE — Telephone Encounter (Signed)
  Follow up Call-     02/25/2022    8:07 AM 07/06/2019    9:28 AM  Call back number  Post procedure Call Back phone  # [phone]  Permission to leave phone message Yes Yes     Patient questions:  Do you have a fever, pain , or abdominal swelling? No. Pain Score  0 *  Have you tolerated food without any problems? Yes.    Have you been able to return to your normal activities? Yes.    Do you have any questions about your discharge instructions: Diet   No. Medications  No. Follow up visit  No.  Do you have questions or concerns about your Care? No.  Actions: * If pain score is 4 or above: No action needed, pain <4.

## 2022-03-18 ENCOUNTER — Inpatient Hospital Stay: Payer: Medicare Other | Attending: Nurse Practitioner | Admitting: Oncology

## 2022-03-18 VITALS — BP 134/80 | HR 56 | Temp 98.2°F | Resp 20 | Ht 71.0 in | Wt 203.0 lb

## 2022-03-18 DIAGNOSIS — Z79899 Other long term (current) drug therapy: Secondary | ICD-10-CM | POA: Diagnosis not present

## 2022-03-18 DIAGNOSIS — K648 Other hemorrhoids: Secondary | ICD-10-CM | POA: Diagnosis not present

## 2022-03-18 DIAGNOSIS — I824Z2 Acute embolism and thrombosis of unspecified deep veins of left distal lower extremity: Secondary | ICD-10-CM | POA: Insufficient documentation

## 2022-03-18 DIAGNOSIS — Z7901 Long term (current) use of anticoagulants: Secondary | ICD-10-CM | POA: Insufficient documentation

## 2022-03-18 DIAGNOSIS — D6851 Activated protein C resistance: Secondary | ICD-10-CM | POA: Diagnosis not present

## 2022-03-18 DIAGNOSIS — I2699 Other pulmonary embolism without acute cor pulmonale: Secondary | ICD-10-CM | POA: Diagnosis not present

## 2022-03-18 DIAGNOSIS — E119 Type 2 diabetes mellitus without complications: Secondary | ICD-10-CM | POA: Insufficient documentation

## 2022-03-18 DIAGNOSIS — Z86718 Personal history of other venous thrombosis and embolism: Secondary | ICD-10-CM | POA: Insufficient documentation

## 2022-03-18 DIAGNOSIS — I252 Old myocardial infarction: Secondary | ICD-10-CM | POA: Diagnosis not present

## 2022-03-18 DIAGNOSIS — I10 Essential (primary) hypertension: Secondary | ICD-10-CM | POA: Insufficient documentation

## 2022-03-18 DIAGNOSIS — I2510 Atherosclerotic heart disease of native coronary artery without angina pectoris: Secondary | ICD-10-CM | POA: Diagnosis not present

## 2022-03-18 DIAGNOSIS — R634 Abnormal weight loss: Secondary | ICD-10-CM | POA: Insufficient documentation

## 2022-03-18 DIAGNOSIS — F32A Depression, unspecified: Secondary | ICD-10-CM | POA: Insufficient documentation

## 2022-03-18 MED ORDER — APIXABAN 2.5 MG PO TABS: 2.5000 mg | ORAL_TABLET | Freq: Two times a day (BID) | ORAL | 1 refills | Status: DC

## 2022-03-18 NOTE — Progress Notes (Signed)
  [place] OFFICE PROGRESS NOTE   Diagnosis: DVT/pulmonary embolism  INTERVAL HISTORY:   William Park returns as scheduled.  He continues apixaban anticoagulation.  No bleeding other than bruising at the forearms.  He underwent a colonoscopy on 02/25/2022.  3 polyps were removed from the transverse colon.  No lesion at the cecum.  Objective:  Vital signs in last 24 hours:  Blood pressure 134/80, pulse (!) 56, temperature 98.2 F (36.8 C), temperature source Oral, resp. rate 20, height '5\' 11"'$  (1.803 m), weight 203 lb (92.1 kg), SpO2 98 %.    Lymphatics: No cervical, supraclavicular, axillary, or inguinal nodes Resp: Lungs clear bilaterally Cardio: Regular rate and rhythm GI: No hepatosplenomegaly Vascular: The left lower leg is slightly larger than the right side, no edema or erythema Skin: Ecchymoses over the forearms.  Lab Results:  Lab Results  Component Value Date   WBC 8.2 11/19/2021   HGB 13.3 11/19/2021   HCT 39.2 11/19/2021   MCV 96.2 11/19/2021   PLT 132.0 (L) 11/19/2021   NEUTROABS 4.5 11/19/2021    CMP  Lab Results  Component Value Date   NA 141 12/17/2021   K 4.2 12/17/2021   CL 101 12/17/2021   CO2 29 12/17/2021   GLUCOSE 99 12/17/2021   BUN 17 12/17/2021   CREATININE 0.93 12/17/2021   CALCIUM 9.3 12/17/2021   PROT 6.3 11/19/2021   ALBUMIN 4.0 11/19/2021   AST 21 11/19/2021   ALT 23 11/19/2021   ALKPHOS 86 11/19/2021   BILITOT 1.0 11/19/2021   GFRNONAA >60 12/17/2021   GFRAA 85 04/10/2020     Medications: I have reviewed the patient's current medications.   Assessment/Plan: Acute PE, left lower extremity DVT October 2022 Factor V Leiden heterozygote Weight loss  Recent diagnosis diabetes 02/19/2021 right L4-5 laminectomy/foraminotomies CAD, history of MI Hypertension Depression Alcohol use History of mild thrombocytopenia CT abdomen/pelvis 12/20/2021-subtle wall thickening of the cecal tip at the appendiceal  orifice Colonoscopy 07/06/2019-diverticuli, internal hemorrhoids Colonoscopy 02/25/2022-polyps removed from the transverse colon-tubular adenomas     Disposition: Mr William Park appears stable.  He continues apixaban anticoagulation after being diagnosed with a DVT and pulmonary embolism in October 2022.  He is a factor V Leiden heterozygote. He has no other known predisposing risk factor for venous thrombosis.  We discussed the indication for continuing long-term anticoagulation.  We discussed the risk of recurrent thrombosis and bleeding.  I recommend continuing apixaban anticoagulation with reduced dose intensity.  He will begin apixaban at a dose of 2.5 mg twice daily.  I encouraged him to discontinue alcohol use, especially while on anticoagulation therapy.  He has a history of mild thrombocytopenia, likely related alcohol use.  He will return for an office visit and CBC in 5 months.  He will continue colonoscopy surveillance with Dr. Silverio Decamp.  Betsy Coder, MD  03/18/2022  9:48 AM

## 2022-03-25 ENCOUNTER — Ambulatory Visit: Payer: Medicare Other | Admitting: Family Medicine

## 2022-03-28 ENCOUNTER — Other Ambulatory Visit: Payer: Self-pay | Admitting: Family Medicine

## 2022-03-29 ENCOUNTER — Telehealth: Payer: Self-pay | Admitting: Pharmacist

## 2022-03-29 ENCOUNTER — Encounter: Payer: Self-pay | Admitting: Gastroenterology

## 2022-03-29 NOTE — Progress Notes (Signed)
Chronic Care Management Pharmacy Assistant   Name: William Park  MRN: [id number] DOB: 1951-06-09   Reason for Encounter: Medication Coordination Call    Recent office visits:  03/11/2022 OV (Primary Care) William Heritage, NP; DOT Exam, no medication changes.  Recent consult visits:  03/18/2022 OV (Oncology) William Pier, MD;  I recommend continuing apixaban anticoagulation with reduced dose intensity.  He will begin apixaban at a dose of 2.5 mg twice daily  02/11/2022 OV (Gastro) William Park;  Advised patient to hold his Eliquis for 2 days prior to time of procedure.  We will communicate with his prescribing physician to ensure this is acceptable for him  01/15/2022 OV (Oncology) William Pier, MD;  We will discuss reduced intensity anticoagulation at the next office visit.  Hospital visits:  None in previous 6 months  Medications: Outpatient Encounter Medications as of 03/29/2022  Medication Sig   acetaminophen (TYLENOL) 500 MG tablet Take 1,000 mg by mouth every 6 (six) hours as needed for moderate pain. (Patient not taking: Reported on 02/25/2022)   amLODipine (NORVASC) 2.5 MG tablet Take 1 tablet (2.5 mg total) by mouth daily.   apixaban (ELIQUIS) 2.5 MG TABS tablet Take 1 tablet (2.5 mg total) by mouth 2 (two) times daily.   atorvastatin (LIPITOR) 80 MG tablet Take 1 tablet (80 mg total) by mouth daily.   busPIRone (BUSPAR) 5 MG tablet Take 1 tablet (5 mg total) by mouth 2 (two) times daily as needed (anxiety).   docusate sodium (COLACE) 100 MG capsule Take 1 capsule (100 mg total) by mouth 2 (two) times daily. (Patient not taking: Reported on 02/25/2022)   escitalopram (LEXAPRO) 10 MG tablet Take 1 tablet (10 mg total) by mouth daily.   ezetimibe (ZETIA) 10 MG tablet Take 10 mg by mouth daily.   famotidine (PEPCID) 20 MG tablet Take 1 tablet (20 mg total) by mouth 2 (two) times daily.   fluticasone (FLONASE) 50 MCG/ACT nasal spray instill TWO SPRAYS  in each nostril ONCE daily   losartan (COZAAR) 50 MG tablet Take 1 tablet (50 mg total) by mouth 2 (two) times daily.   metFORMIN (GLUCOPHAGE) 500 MG tablet Take 1 tablet (500 mg total) by mouth daily with breakfast.   metoprolol tartrate (LOPRESSOR) 25 MG tablet Take 0.5 tablets (12.5 mg total) by mouth 2 (two) times daily.   omeprazole (PRILOSEC) 40 MG capsule Take 1 capsule (40 mg total) by mouth daily. [place].   sildenafil (VIAGRA) 100 MG tablet Take 1 tablet (100 mg total) by mouth daily as needed for erectile dysfunction.   No facility-administered encounter medications on file as of 03/29/2022.   Reviewed chart for medication changes ahead of medication coordination call.  BP Readings from Last 3 Encounters:  03/18/22 134/80  02/25/22 100/70  02/11/22 126/70    Lab Results  Component Value Date   HGBA1C 6.6 (H) 11/19/2021     Patient obtains medications through Vials  90 Days   Last adherence delivery included: Amlodipine 5 mg 1 tablet daily - requested new Rx Omeprazole 40 mg once a day Losartan Potassium 50 mg twice a day - requested new Rx Famotidine 20 mg twice daily Buspirone 5 mg three times daily as needed Metoprolol Tartrate 12.5 mg twice daily - break in half Atorvastatin 80 mg once a day Escitalopram 10 mg once daily Ezetimibe 10 mg daily Metformin 500 mg every morning Flonase nasal spray  Patient is due for next  adherence delivery on: 04/11/2022. Called patient and reviewed medications and coordinated delivery.  This delivery to include: Amlodipine 5 mg 1 tablet daily - requested new Rx Omeprazole 40 mg once a day Losartan Potassium 50 mg twice a day - requested new Rx Famotidine 20 mg twice daily Buspirone 5 mg three times daily as needed Metoprolol Tartrate 12.5 mg twice daily - break in half Atorvastatin 80 mg once a day Escitalopram 10 mg once daily Ezetimibe 10 mg daily Metformin 500 mg every morning  Patient declined the  following medications: Fluticasone nasal spray - has on hand Eliquis 2.5 mg - has on hand  Confirmed delivery date of 04/11/2022, advised patient that pharmacy will contact them the morning of delivery.   Care Gaps: Medicare Annual Wellness: Completed 08/20/2021 Hemoglobin A1C: 6.6% on 11/19/2021 Colonoscopy: Next due on 07/05/2029  Future Appointments  Date Time Provider [place]  04/15/2022  2:00 PM [facility] CCM PHARMACIST [facility]  05/13/2022  1:20 PM William Olp, MD [facility]  08/19/2022 11:30 AM [facility] PHLEBOTOMIST [facility] None  08/19/2022 12:00 PM William Pier, MD [facility] None  09/02/2022  9:30 AM [facility] HEALTH COACH [facility]   William Park, [place] Pharmacist Assistant [phone]

## 2022-04-08 NOTE — Progress Notes (Unsigned)
Chronic Care Management Pharmacy Note  04/17/2022 Name:  William Park MRN:  [id number] DOB:  01/13/1951  Summary: PharmD FU visit.  BP pretty well controlled on current regimen.  LDL has improved at close to goal < 70.    Recommendations/Changes made from today's visit: No changes to medication needed at this time  Plan: FU 6 months   Subjective: William Park is an 71 y.o. year old male who is a primary patient of Hunter, Brayton Mars, MD.  The CCM team was consulted for assistance with disease management and care coordination needs.    Engaged with patient by telephone for follow up visit in response to provider referral for pharmacy case management and/or care coordination services.   Consent to Services:  The patient was given the following information about Chronic Care Management services today, agreed to services, and gave verbal consent: 1. CCM service includes personalized support from designated clinical staff supervised by the primary care provider, including individualized plan of care and coordination with other care providers 2. 24/7 contact phone numbers for assistance for urgent and routine care needs. 3. Service will only be billed when office clinical staff spend 20 minutes or more in a month to coordinate care. 4. Only one practitioner may furnish and bill the service in a calendar month. 5.The patient may stop CCM services at any time (effective at the end of the month) by phone call to the office staff. 6. The patient will be responsible for cost sharing (co-pay) of up to 20% of the service fee (after annual deductible is met). Patient agreed to services and consent obtained.  Patient Care Team: Marin Olp, MD as PCP - General (Family Medicine) Troy Sine, MD as PCP - Cardiology (Cardiology) Renee Ramus, MD as Consulting Physician (Cardiology) Edythe Clarity, [facility] (Pharmacist)  Recent office visits:  03/11/2022 OV (Primary Care)  Armanda Heritage, NP; DOT Exam, no medication changes.   Recent consult visits:  03/18/2022 OV (Oncology) Ladell Pier, MD;  I recommend continuing apixaban anticoagulation with reduced dose intensity.  He will begin apixaban at a dose of 2.5 mg twice daily   02/11/2022 OV (Gastro) [place], [place], [place];  Advised patient to hold his Eliquis for 2 days prior to time of procedure.  We will communicate with his prescribing physician to ensure this is acceptable for him   01/15/2022 OV (Oncology) Ladell Pier, MD;  We will discuss reduced intensity anticoagulation at the next office visit.   Hospital visits:  None in previous 6 months  Objective:  Lab Results  Component Value Date   CREATININE 0.93 12/17/2021   BUN 17 12/17/2021   GFR 73.15 11/19/2021   EGFR 69 05/01/2021   GFRNONAA >60 12/17/2021   GFRAA 85 04/10/2020   NA 141 12/17/2021   K 4.2 12/17/2021   CALCIUM 9.3 12/17/2021   CO2 29 12/17/2021   GLUCOSE 99 12/17/2021    Lab Results  Component Value Date/Time   HGBA1C 6.6 (H) 11/19/2021 03:07 PM   GFR 73.15 11/19/2021 03:07 PM   GFR 65.62 07/10/2021 10:39 AM    Last diabetic Eye exam: No results found for: "HMDIABEYEEXA"  Last diabetic Foot exam: No results found for: "HMDIABFOOTEX"   Lab Results  Component Value Date   CHOL 95 07/10/2021   HDL 41.60 07/10/2021   LDLCALC 36 07/10/2021   LDLDIRECT 74.0 11/19/2021   TRIG 88.0 07/10/2021   CHOLHDL 2 07/10/2021  Latest Ref Rng & Units 11/19/2021    3:07 PM 07/10/2021   10:39 AM 05/01/2021   12:07 PM  Hepatic Function  Total Protein 6.0 - 8.3 g/dL 6.3  6.3  7.5   Albumin 3.5 - 5.2 g/dL 4.0  3.7  4.6   AST 0 - 37 U/L $Remo'21  24  23   'PVJzY$ ALT 0 - 53 U/L $Remo'23  24  20   'nqKFt$ Alk Phosphatase 39 - 117 U/L 86  78  113   Total Bilirubin 0.2 - 1.2 mg/dL 1.0  0.9  1.4     Lab Results  Component Value Date/Time   TSH 0.78 11/19/2021 03:07 PM   TSH 1.010 05/01/2021 12:07 PM       Latest Ref Rng & Units 11/19/2021     3:07 PM 07/10/2021   10:39 AM 05/19/2021    5:30 AM  CBC  WBC 4.0 - 10.5 K/uL 8.2  5.6  7.2   Hemoglobin 13.0 - 17.0 g/dL 13.3  12.3  13.6   Hematocrit 39.0 - 52.0 % 39.2  37.0  40.4   Platelets 150.0 - 400.0 K/uL 132.0  196.0  140     No results found for: "VD25OH"  Clinical ASCVD: Yes  The ASCVD Risk score (Arnett DK, et al., 2019) failed to calculate for the following reasons:   The patient has a prior MI or stroke diagnosis       11/19/2021    2:43 PM 09/26/2021    2:52 PM 08/20/2021    9:34 AM  Depression screen PHQ 2/9  Decreased Interest 0 0 0  Down, Depressed, Hopeless 0 3 1  PHQ - 2 Score 0 3 1  Altered sleeping 1 3   Tired, decreased energy 0 3 0  Change in appetite 0 3 0  Feeling bad or failure about yourself  0 2 0  Trouble concentrating 0 0 0  Moving slowly or fidgety/restless 0 0 0  Suicidal thoughts 0 0 0  PHQ-9 Score 1 14   Difficult doing work/chores Not difficult at all        Social History   Tobacco Use  Smoking Status Former   Packs/day: 2.00   Years: 25.00   Total pack years: 50.00   Types: Cigarettes   Quit date: 07/30/1987   Years since quitting: 34.7  Smokeless Tobacco Never   BP Readings from Last 3 Encounters:  03/18/22 134/80  02/25/22 100/70  02/11/22 126/70   Pulse Readings from Last 3 Encounters:  03/18/22 (!) 56  02/25/22 (!) 50  02/11/22 (!) 51   Wt Readings from Last 3 Encounters:  03/18/22 203 lb (92.1 kg)  02/25/22 204 lb (92.5 kg)  02/11/22 204 lb 6.4 oz (92.7 kg)   BMI Readings from Last 3 Encounters:  03/18/22 28.31 kg/m  02/25/22 28.45 kg/m  02/11/22 28.51 kg/m    Assessment/Interventions: Review of patient past medical history, allergies, medications, health status, including review of consultants reports, laboratory and other test data, was performed as part of comprehensive evaluation and provision of chronic care management services.   SDOH:  (Social Determinants of Health) assessments and interventions  performed: No Financial Resource Strain: Low Risk  (08/20/2021)   Overall Financial Resource Strain (CARDIA)    Difficulty of Paying Living Expenses: Not hard at all    SDOH Interventions    [place] Office Visit from 04/09/2021 in [place] Visit from 04/10/2020 in [place] Visit from  02/28/2020 in [place] Visit from 11/10/2018 in [place] from 12/09/2017 in [place]  SDOH Interventions       Depression Interventions/Treatment  Currently on Treatment Medication Medication, Counseling Counseling --  [NA]      SDOH Screenings   Food Insecurity: No Food Insecurity (08/20/2021)  Housing: Low Risk  (08/20/2021)  Transportation Needs: No Transportation Needs (08/20/2021)  Depression (PHQ2-9): Low Risk  (11/19/2021)  Recent Concern: Depression (PHQ2-9) - Medium Risk (09/26/2021)  Financial Resource Strain: Low Risk  (08/20/2021)  Physical Activity: Inactive (08/20/2021)  Social Connections: Moderately Isolated (08/20/2021)  Stress: Stress Concern Present (08/20/2021)  Tobacco Use: Medium Risk (02/25/2022)    CCM Care Plan  No Known Allergies  Medications Reviewed Today     Reviewed by Edythe Clarity, [facility] (Pharmacist) on 04/17/22 at 1539  Med List Status: <None>   Medication Order Taking? Sig Documenting Provider Last Dose Status Informant  acetaminophen (TYLENOL) 500 MG tablet [id number] Yes Take 1,000 mg by mouth every 6 (six) hours as needed for moderate pain. [provider] Taking Active Self  amLODipine (NORVASC) 2.5 MG tablet [id number] Yes Take 1 tablet (2.5 mg total) by mouth daily. Marin Olp, MD Taking Active   apixaban Arne [place]) 2.5 MG TABS tablet [id number] Yes Take 1 tablet (2.5 mg total) by mouth 2 (two) times daily. Ladell Pier, MD Taking Active   atorvastatin (LIPITOR) 80 MG tablet  [id number] Yes Take 1 tablet (80 mg total) by mouth daily. Marin Olp, MD Taking Active   busPIRone (BUSPAR) 5 MG tablet [id number] Yes Take 1 tablet (5 mg total) by mouth 2 (two) times daily as needed (anxiety). Marin Olp, MD Taking Active   docusate sodium (COLACE) 100 MG capsule [id number]  Take 1 capsule (100 mg total) by mouth 2 (two) times daily.  Patient not taking: Reported on 02/25/2022   Viona Gilmore D, NP  Active Self  escitalopram (LEXAPRO) 10 MG tablet [id number] Yes Take 1 tablet (10 mg total) by mouth daily. Marin Olp, MD Taking Active   ezetimibe (ZETIA) 10 MG tablet [id number] Yes Take 10 mg by mouth daily. [provider] Taking Active   famotidine (PEPCID) 20 MG tablet [id number] Yes Take 1 tablet (20 mg total) by mouth 2 (two) times daily. Marin Olp, MD Taking Active   fluticasone [facility]) 50 MCG/ACT nasal spray [id number] Yes instill TWO SPRAYS in each nostril ONCE daily Marin Olp, MD Taking Active   losartan (COZAAR) 50 MG tablet [id number] Yes Take 1 tablet (50 mg total) by mouth 2 (two) times daily. Marin Olp, MD Taking Active   metFORMIN (GLUCOPHAGE) 500 MG tablet [id number] Yes Take 1 tablet (500 mg total) by mouth daily with breakfast. Marin Olp, MD Taking Active   metoprolol tartrate (LOPRESSOR) 25 MG tablet [id number] Yes Take 0.5 tablets (12.5 mg total) by mouth 2 (two) times daily. Marin Olp, MD Taking Active   omeprazole (PRILOSEC) 40 MG capsule [id number] Yes Take 1 capsule (40 mg total) by mouth daily. [place]. Marin Olp, MD Taking Active   sildenafil (VIAGRA) 100 MG tablet [id number] Yes Take 1 tablet (100 mg total) by mouth daily as needed for erectile dysfunction. Marin Olp, MD Taking Active Self            Patient Active Problem List   Diagnosis Date Noted   Deep vein thrombosis (DVT) of distal  vein of left lower extremity ([place]) 07/10/2021   Overweight (BMI  25.0-29.9) 05/19/2021   Pulmonary embolism ([place]) 05/18/2021   Hyperglycemia 05/18/2021   Alcohol use 05/18/2021   Trochanteric bursitis of right hip 04/20/2021   Spinal stenosis of lumbar region with neurogenic claudication 02/19/2021   Right leg pain 01/30/2021   Other chronic pain 01/30/2021   Peroneal tendinitis, right 05/18/2018   Major depressive disorder with single episode, in full remission ([place]) 05/12/2018   Anemia 08/13/2016   Former smoker 01/06/2015   CAD (coronary artery disease)    Hyperlipidemia    Hypertension    GERD (gastroesophageal reflux disease)    Osteoarthritis    Allergic rhinitis     Immunization History  Administered Date(s) Administered   Fluad Quad(high Dose 65+) 04/10/2020, 04/09/2021   Influenza Whole 04/29/2019   Influenza, High Dose Seasonal PF 04/07/2017, 05/12/2018   Influenza,inj,Quad PF,6+ Mos 07/21/2015   Influenza-Unspecified 07/17/2016   Moderna Sars-Covid-2 Vaccination 11/22/2019, 12/20/2019   Pneumococcal Conjugate-13 07/21/2015   Pneumococcal Polysaccharide-23 03/10/2017   Tdap 01/26/2016   Zoster Recombinat (Shingrix) 07/31/2021, 10/10/2021    Conditions to be addressed/monitored:  CAD, HTN, PE, HLD, Depression  Care Plan : General Pharmacy (Adult)  Updates made by Edythe Clarity, RPH since 04/17/2022 12:00 AM     Problem: HTN, HLD   Priority: High     Long-Range Goal: Patient-Specific Goal   Start Date: 04/15/2022  Expected End Date: 10/16/2022  This Visit's Progress: On track  Priority: High  Note:   Current Barriers:  Unable to maintain control of BP  Pharmacist Clinical Goal(s):  Patient will achieve improvement in BP as evidenced by monitoring through collaboration with PharmD and provider.   Interventions: 1:1 collaboration with Marin Olp, MD regarding development and update of comprehensive plan of care as evidenced by provider attestation and co-signature Inter-disciplinary care team collaboration  (see longitudinal plan of care) Comprehensive medication review performed; medication list updated in electronic medical record  Hypertension (BP goal <130/80) -Controlled, based on in office readings -Current treatment: 120-140s/60-70s -Medications previously tried: none noted -Current home readings:  Amlodipine 2.5mg  Appropriate, Effective, Safe, Accessible Losartan 50mg  Appropriate, Effective, Safe, Accessible Metoprolol tartrate 12.5mg  bid Appropriate, Effective, Safe, Accessible -Denies hypotensive/hypertensive symptoms -Educated on BP goals and benefits of medications for prevention of heart attack, stroke and kidney damage; -Counseled to monitor BP at home a few times per week, document, and provide log at future appointments -Recommended to continue current medication Have him monitor at home, CMA to FU on BP next month  Hyperlipidemia: (LDL goal < 70) 04/15/22 -Not ideally controlled -Current treatment: Atorvastatin 80mg  Appropriate, Effective, Safe, Accessible Zetia 10mg  Appropriate, Effective, Safe, Accessible -Medications previously tried: none noted  -Current dietary patterns: same see previous -Current exercise habits: same see previous -Educated on Cholesterol goals;  Benefits of statin for ASCVD risk reduction; -Recommended to continue current medication LDL almost at goal, remains adherent with current regimen.  No changes at this time, continue to do routine lipid screenings to push goal < 70.  Patient Goals/Self-Care Activities Patient will:  - take medications as prescribed as evidenced by patient report and record review  Follow Up Plan: The care management team will reach out to the patient again over the next 180 days.       Medication Assistance: None required.  Patient affirms current coverage meets needs.  Compliance/Adherence/Medication fill history: Care Gaps: None  Star-Rating Drugs: Atorvastatin 80mg  04/05/22 90ds Losartan 50mg  04/05/22  90ds Metformin 500mg   04/05/22 90ds  Patient's preferred pharmacy is:  [facility] - [place], [place] - [address] [place] [address] Phone: [phone] Fax: [phone]  Uses pill box? Yes Pt endorses 100% compliance  We discussed: Benefits of medication synchronization, packaging and delivery as well as enhanced pharmacist oversight with [facility]. Patient decided to: Continue current medication management strategy  Care Plan and Follow Up Patient Decision:  Patient agrees to Care Plan and Follow-up.  Plan: The care management team will reach out to the patient again over the next 180 days.  Beverly Milch, PharmD Clinical Pharmacist  [facility] [phone]

## 2022-04-15 ENCOUNTER — Ambulatory Visit (INDEPENDENT_AMBULATORY_CARE_PROVIDER_SITE_OTHER): Payer: Medicare Other | Admitting: Pharmacist

## 2022-04-15 DIAGNOSIS — E782 Mixed hyperlipidemia: Secondary | ICD-10-CM

## 2022-04-15 DIAGNOSIS — I10 Essential (primary) hypertension: Secondary | ICD-10-CM

## 2022-04-17 NOTE — Patient Instructions (Addendum)
Visit Information   Goals Addressed             This Visit's Progress    Track and Manage My Blood Pressure-Hypertension       Timeframe:  Long-Range Goal Priority:  High Start Date:      04/15/22                       Expected End Date:     10/13/21                  Follow Up Date 07/15/22/    - check blood pressure 3 times per week    Why is this important?   You won't feel high blood pressure, but it can still hurt your blood vessels.  High blood pressure can cause heart or kidney problems. It can also cause a stroke.  Making lifestyle changes like losing a little weight or eating less salt will help.  Checking your blood pressure at home and at different times of the day can help to control blood pressure.  If the doctor prescribes medicine remember to take it the way the doctor ordered.  Call the office if you cannot afford the medicine or if there are questions about it.     Notes:        Patient Care Plan: General Pharmacy (Adult)     Problem Identified: HTN, HLD   Priority: High     Long-Range Goal: Patient-Specific Goal   Start Date: 04/15/2022  Expected End Date: 10/16/2022  This Visit's Progress: On track  Priority: High  Note:   Current Barriers:  Unable to maintain control of BP  Pharmacist Clinical Goal(s):  Patient will achieve improvement in BP as evidenced by monitoring through collaboration with PharmD and provider.   Interventions: 1:1 collaboration with Marin Olp, MD regarding development and update of comprehensive plan of care as evidenced by provider attestation and co-signature Inter-disciplinary care team collaboration (see longitudinal plan of care) Comprehensive medication review performed; medication list updated in electronic medical record  Hypertension (BP goal <130/80) -Controlled, based on in office readings -Current treatment: 120-140s/60-70s -Medications previously tried: none noted -Current home readings:   Amlodipine 2.'5mg'$  Appropriate, Effective, Safe, Accessible Losartan '50mg'$  Appropriate, Effective, Safe, Accessible Metoprolol tartrate 12.'5mg'$  bid Appropriate, Effective, Safe, Accessible -Denies hypotensive/hypertensive symptoms -Educated on BP goals and benefits of medications for prevention of heart attack, stroke and kidney damage; -Counseled to monitor BP at home a few times per week, document, and provide log at future appointments -Recommended to continue current medication Have him monitor at home, CMA to FU on BP next month  Hyperlipidemia: (LDL goal < 70) 04/15/22 -Not ideally controlled -Current treatment: Atorvastatin '80mg'$  Appropriate, Effective, Safe, Accessible Zetia '10mg'$  Appropriate, Effective, Safe, Accessible -Medications previously tried: none noted  -Current dietary patterns: same see previous -Current exercise habits: same see previous -Educated on Cholesterol goals;  Benefits of statin for ASCVD risk reduction; -Recommended to continue current medication LDL almost at goal, remains adherent with current regimen.  No changes at this time, continue to do routine lipid screenings to push goal < 70.  Patient Goals/Self-Care Activities Patient will:  - take medications as prescribed as evidenced by patient report and record review  Follow Up Plan: The care management team will reach out to the patient again over the next 180 days.       The patient verbalized understanding of instructions, educational materials, and care plan provided today and  DECLINED offer to receive copy of patient instructions, educational materials, and care plan.  Telephone follow up appointment with pharmacy team member scheduled for: 6 months  Edythe Clarity, [place], PharmD Clinical Pharmacist  [facility] [phone]

## 2022-04-22 ENCOUNTER — Encounter: Payer: Self-pay | Admitting: *Deleted

## 2022-04-27 DIAGNOSIS — E785 Hyperlipidemia, unspecified: Secondary | ICD-10-CM

## 2022-04-27 DIAGNOSIS — I10 Essential (primary) hypertension: Secondary | ICD-10-CM | POA: Diagnosis not present

## 2022-05-12 ENCOUNTER — Other Ambulatory Visit: Payer: Self-pay | Admitting: Oncology

## 2022-05-13 ENCOUNTER — Ambulatory Visit (INDEPENDENT_AMBULATORY_CARE_PROVIDER_SITE_OTHER)
Admission: RE | Admit: 2022-05-13 | Discharge: 2022-05-13 | Disposition: A | Discharge status: Home / Self Care | Payer: Medicare Other | Source: Ambulatory Visit | Attending: Family Medicine | Admitting: Family Medicine

## 2022-05-13 ENCOUNTER — Encounter: Payer: Self-pay | Admitting: Family Medicine

## 2022-05-13 ENCOUNTER — Telehealth: Payer: Self-pay | Admitting: Pharmacist

## 2022-05-13 ENCOUNTER — Ambulatory Visit (INDEPENDENT_AMBULATORY_CARE_PROVIDER_SITE_OTHER): Payer: Medicare Other | Admitting: Family Medicine

## 2022-05-13 VITALS — BP 132/78 | HR 55 | Temp 98.6°F | Ht 71.0 in | Wt 199.8 lb

## 2022-05-13 DIAGNOSIS — Z23 Encounter for immunization: Secondary | ICD-10-CM

## 2022-05-13 DIAGNOSIS — I25118 Atherosclerotic heart disease of native coronary artery with other forms of angina pectoris: Secondary | ICD-10-CM

## 2022-05-13 DIAGNOSIS — R739 Hyperglycemia, unspecified: Secondary | ICD-10-CM | POA: Diagnosis not present

## 2022-05-13 DIAGNOSIS — E782 Mixed hyperlipidemia: Secondary | ICD-10-CM

## 2022-05-13 DIAGNOSIS — R053 Chronic cough: Secondary | ICD-10-CM | POA: Diagnosis not present

## 2022-05-13 DIAGNOSIS — I10 Essential (primary) hypertension: Secondary | ICD-10-CM

## 2022-05-13 DIAGNOSIS — R931 Abnormal findings on diagnostic imaging of heart and coronary circulation: Secondary | ICD-10-CM | POA: Diagnosis not present

## 2022-05-13 MED ORDER — SILDENAFIL CITRATE 100 MG PO TABS: 100.0000 mg | ORAL_TABLET | Freq: Every day | ORAL | 11 refills | Status: DC | PRN

## 2022-05-13 MED ORDER — BENZONATATE 100 MG PO CAPS: 100.0000 mg | ORAL_CAPSULE | Freq: Three times a day (TID) | ORAL | 4 refills | Status: DC | PRN

## 2022-05-13 MED ORDER — AMLODIPINE BESYLATE 5 MG PO TABS: 5.0000 mg | ORAL_TABLET | Freq: Every day | ORAL | 3 refills | Status: DC

## 2022-05-13 NOTE — Progress Notes (Signed)
Chronic Care Management Pharmacy Assistant   Name: William Park  MRN: [id number] DOB: 08-Dec-1950   Reason for Encounter: Hypertension Adherence Call    Recent office visits:  None  Recent consult visits:  None  Hospital visits:  None in previous 6 months  Medications: Outpatient Encounter Medications as of 05/13/2022  Medication Sig   acetaminophen (TYLENOL) 500 MG tablet Take 1,000 mg by mouth every 6 (six) hours as needed for moderate pain.   amLODipine (NORVASC) 2.5 MG tablet Take 1 tablet (2.5 mg total) by mouth daily.   apixaban (ELIQUIS) 2.5 MG TABS tablet Take 1 tablet (2.5 mg total) by mouth 2 (two) times daily.   atorvastatin (LIPITOR) 80 MG tablet Take 1 tablet (80 mg total) by mouth daily.   busPIRone (BUSPAR) 5 MG tablet Take 1 tablet (5 mg total) by mouth 2 (two) times daily as needed (anxiety).   docusate sodium (COLACE) 100 MG capsule Take 1 capsule (100 mg total) by mouth 2 (two) times daily. (Patient not taking: Reported on 02/25/2022)   escitalopram (LEXAPRO) 10 MG tablet Take 1 tablet (10 mg total) by mouth daily.   ezetimibe (ZETIA) 10 MG tablet Take 10 mg by mouth daily.   famotidine (PEPCID) 20 MG tablet Take 1 tablet (20 mg total) by mouth 2 (two) times daily.   fluticasone (FLONASE) 50 MCG/ACT nasal spray instill TWO SPRAYS in each nostril ONCE daily   losartan (COZAAR) 50 MG tablet Take 1 tablet (50 mg total) by mouth 2 (two) times daily.   metFORMIN (GLUCOPHAGE) 500 MG tablet Take 1 tablet (500 mg total) by mouth daily with breakfast.   metoprolol tartrate (LOPRESSOR) 25 MG tablet Take 0.5 tablets (12.5 mg total) by mouth 2 (two) times daily.   omeprazole (PRILOSEC) 40 MG capsule Take 1 capsule (40 mg total) by mouth daily. [place].   sildenafil (VIAGRA) 100 MG tablet Take 1 tablet (100 mg total) by mouth daily as needed for erectile dysfunction.   No facility-administered encounter medications on file as of 05/13/2022.    Reviewed chart prior to disease state call. Spoke with patient regarding BP  Recent Office Vitals: BP Readings from Last 3 Encounters:  03/18/22 134/80  02/25/22 100/70  02/11/22 126/70   Pulse Readings from Last 3 Encounters:  03/18/22 (!) 56  02/25/22 (!) 50  02/11/22 (!) 51    Wt Readings from Last 3 Encounters:  03/18/22 203 lb (92.1 kg)  02/25/22 204 lb (92.5 kg)  02/11/22 204 lb 6.4 oz (92.7 kg)     Kidney Function Lab Results  Component Value Date/Time   CREATININE 0.93 12/17/2021 03:59 PM   CREATININE 1.03 11/19/2021 03:07 PM   CREATININE 1.10 08/28/2021 10:45 AM   CREATININE 1.03 04/10/2020 03:16 PM   GFR 73.15 11/19/2021 03:07 PM   GFRNONAA >60 12/17/2021 03:59 PM   GFRNONAA 74 04/10/2020 03:16 PM   GFRAA 85 04/10/2020 03:16 PM       Latest Ref Rng & Units 12/17/2021    3:59 PM 11/19/2021    3:07 PM 08/28/2021   10:45 AM  BMP  Glucose 70 - 99 mg/dL 99  90    BUN 8 - 23 mg/dL 17  19    Creatinine 0.61 - 1.24 mg/dL 0.93  1.03  1.10   Sodium 135 - 145 mmol/L 141  141    Potassium 3.5 - 5.1 mmol/L 4.2  4.2    Chloride 98 - 111 mmol/L 101  103    CO2 22 - 32 mmol/L 29  30    Calcium 8.9 - 10.3 mg/dL 9.3  9.0      Current antihypertensive regimen:  Amlodipine 2.5 mg daily Losartan 50 mg twice daily Metoprolol Tartrate 25 mg 1/2 tablet twice daily  How often are you checking your Blood Pressure? 1-2x per week  Current home BP readings: 140/60  What recent interventions/DTPs have been made by any provider to improve Blood Pressure control since last CPP Visit: No recent interventions or DTPs.  Any recent hospitalizations or ED visits since last visit with CPP? No  What diet changes have been made to improve Blood Pressure Control?  No recent diet changes.  What exercise is being done to improve your Blood Pressure Control?  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  Adherence Review: Is the patient currently on ACE/ARB  medication? Yes Does the patient have >5 day gap between last estimated fill dates? No   Care Gaps: Medicare Annual Wellness: Completed 08/20/2021 Hemoglobin A1C: 6.6% on 11/19/2021 Colonoscopy: Next due on 07/05/2029  Future Appointments  Date Time Provider [place]  05/13/2022  1:20 PM Marin Olp, MD [facility]  08/19/2022 11:30 AM [facility] PHLEBOTOMIST [facility] None  08/19/2022 12:00 PM Ladell Pier, MD [facility] None  09/02/2022  9:30 AM [facility] HEALTH COACH [facility]  10/21/2022 11:00 AM [facility] CCM PHARMACIST [facility]   Star Rating Drugs: Atorvastatin 80 mg last filled 04/05/2022 90 DS Losartan 50 mg last filled 04/05/2022 90 DS Metformin 500 mg last filled 04/05/2022 90 DS  April D Calhoun, [place] Pharmacist Assistant [phone]

## 2022-05-13 NOTE — Patient Instructions (Addendum)
Please go to [facility]  central X-ray (updated 09/23/2019) - located [address] across the street from [place] - in the basement - Hours: 8:30-5:00 PM M-F (with lunch from 12:30- 1 PM). You do NOT need an appointment.   Blood pressure at home running above goal into the 140s a fair amount of the time-we will try to reduce and keep him more regularly under 140 by increasing amlodipine to 5 mg.  In office blood pressure was reasonably well controlled but we also want him to have good control at home.  Advise to follow up with therapist in [facility] for cough and I agree with following up with Dr. Benjamine Mola  Recommended follow up: Return in about 6 months (around 11/12/2022) for followup or sooner if needed.Schedule b4 you leave.

## 2022-05-13 NOTE — Progress Notes (Signed)
Phone [phone] In person visit   Subjective:   William Park is a 71 y.o. year old very pleasant male patient who presents for/with See problem oriented charting Chief Complaint  Patient presents with   Follow-up   Hypertension   mucous    Pt c/o mucous build up still lingering in throat.   Past Medical History-  Patient Active Problem List   Diagnosis Date Noted   Deep vein thrombosis (DVT) of distal vein of left lower extremity ([place]) 07/10/2021    Priority: High   Pulmonary embolism ([place]) 05/18/2021    Priority: High   CAD (coronary artery disease)     Priority: High   Hyperglycemia 05/18/2021    Priority: Medium    Alcohol use 05/18/2021    Priority: Medium    Spinal stenosis of lumbar region with neurogenic claudication 02/19/2021    Priority: Medium    Major depressive disorder with single episode, in full remission ([place]) 05/12/2018    Priority: Medium    Hyperlipidemia     Priority: Medium    Hypertension     Priority: Medium    GERD (gastroesophageal reflux disease)     Priority: Medium    Osteoarthritis     Priority: Medium    Former smoker 01/06/2015    Priority: Low   Allergic rhinitis     Priority: Low   Overweight (BMI 25.0-29.9) 05/19/2021   Trochanteric bursitis of right hip 04/20/2021   Right leg pain 01/30/2021   Other chronic pain 01/30/2021   Peroneal tendinitis, right 05/18/2018   Anemia 08/13/2016    Medications- reviewed and updated Current Outpatient Medications  Medication Sig Dispense Refill   acetaminophen (TYLENOL) 500 MG tablet Take 1,000 mg by mouth every 6 (six) hours as needed for moderate pain.     amLODipine (NORVASC) 2.5 MG tablet Take 1 tablet (2.5 mg total) by mouth daily. 90 tablet 3   atorvastatin (LIPITOR) 80 MG tablet Take 1 tablet (80 mg total) by mouth daily. 90 tablet 3   busPIRone (BUSPAR) 5 MG tablet Take 1 tablet (5 mg total) by mouth 2 (two) times daily as needed (anxiety). 180 tablet 3   ELIQUIS 2.5  MG TABS tablet TAKE ONE TABLET BY MOUTH TWICE DAILY 60 tablet 2   escitalopram (LEXAPRO) 10 MG tablet Take 1 tablet (10 mg total) by mouth daily. 90 tablet 3   ezetimibe (ZETIA) 10 MG tablet Take 10 mg by mouth daily.     famotidine (PEPCID) 20 MG tablet Take 1 tablet (20 mg total) by mouth 2 (two) times daily. 180 tablet 3   fluticasone (FLONASE) 50 MCG/ACT nasal spray instill TWO SPRAYS in each nostril ONCE daily 48 g 0   losartan (COZAAR) 50 MG tablet Take 1 tablet (50 mg total) by mouth 2 (two) times daily. 180 tablet 3   metFORMIN (GLUCOPHAGE) 500 MG tablet Take 1 tablet (500 mg total) by mouth daily with breakfast. 90 tablet 3   metoprolol tartrate (LOPRESSOR) 25 MG tablet Take 0.5 tablets (12.5 mg total) by mouth 2 (two) times daily. 180 tablet 3   omeprazole (PRILOSEC) 40 MG capsule Take 1 capsule (40 mg total) by mouth daily. [place]. 90 capsule 3   sildenafil (VIAGRA) 100 MG tablet Take 1 tablet (100 mg total) by mouth daily as needed for erectile dysfunction. 100 tablet 11   docusate sodium (COLACE) 100 MG capsule Take 1 capsule (100 mg total) by mouth 2 (two) times daily. (Patient  not taking: Reported on 02/25/2022) 10 capsule 0   No current facility-administered medications for this visit.     Objective:  BP 132/78   Pulse (!) 55   Temp 98.6 F (37 C)   Ht '5\' 11"'$  (1.803 m)   Wt 199 lb 12.8 oz (90.6 kg)   SpO2 96%   BMI 27.87 kg/m  Gen: NAD, resting comfortably CV: RRR no murmurs rubs or gallops Lungs: CTAB no crackles, wheeze, rhonchi Ext: trace edema (advised to monitor with increasing amlodipine) Skin: warm, dry    Assessment and Plan   # Congestion S:noted at April visit- added allegra to flonase and helped somewhat prior ot last vist. He reports ENT visit in [place]- but did not get much from visit. Has seen Dr. Benjamine Mola in the past for hearing and is planning to see him again.   - common to get into coughing fits.  -50 pack years but quit in  1989 -reports years and years of issues- did have CT angio 08/28/21 without obvious cause -feels like down in throat- feels worse lately in last 2-4 months - white dry spit vs. Brownish color- no blood A/P: with ongoing issues- reasonable to see ENT.  -still using allegra and flonase - occasional sudafed- needs to watch BP if taking this - considered CXR but already had CTA while actively having cough In January- with worsening will get CXR -GI did not think GI related issues- had colonoscopy in last 6 months- though no repeat endoscopy which had years ago  #DVT/PE- due to factor V Leiden heterozygote - sees Dr. Benay Spice S: Patient diagnosed with pulmonary embolism on 05/18/2021.  Also diagnosed with DVT.   -Patient compliant with Eliquis 2.5 mg twice daily-originally cost was a big concern -diagnosed with Factor V Leiden heterozygote A/P: chronic dvt/PE controlled with ongoing eliquis- likely provoked by Factor V Leiden heterozygote- long term anticoagulation planned   #CAD  #hyperlipidemia S: Medication: Atorvastatin 80 mg, Zetia 10 mg-may not require Zetia, on eliquis 2.5 mg BID due to factor V Leiden heterozygote - no chest pain or shortness of breath Lab Results  Component Value Date   CHOL 95 07/10/2021   HDL 41.60 07/10/2021   LDLCALC 36 07/10/2021   LDLDIRECT 74.0 11/19/2021   TRIG 88.0 07/10/2021   CHOLHDL 2 07/10/2021  A/P: lipids were well controlled at ideal goal- continue current meds CAD asymptomatic- continue current meds   #hypertension S: medication: Amlodipine 2.5 mg , losartan 50 mg BID, Toprol 12.5 mg twice daily -Mild intermittent cough on losartan Home readings #s: 130s-140s at home A/P: Blood pressure at home running above goal into the 140s a fair amount of the time-we will try to reduce and keep him more regularly under 140 by increasing amlodipine to 5 mg.  In office blood pressure was reasonably well controlled but we also want him to have good control at  home. -alcohol has slipped up to 3-4 per night- recommended max 2 a night - full cessation ideal and recommended AA if cant cut back  # Depression/anxiety S: Medication:lexapro 10 mg.  Buspirone 5 mg 3 times daily anxiety Therapy/counseling: Patient follows with a therapist in [place] in past- advised to follow up  -loss of GF of 28 years a big factor - but talking to new woman now Thoughts of self-harm: none     05/13/2022    1:07 PM 11/19/2021    2:43 PM 09/26/2021    2:52 PM  Depression screen PHQ 2/9  Decreased Interest 0 0 0  Down, Depressed, Hopeless 0 0 3  PHQ - 2 Score 0 0 3  Altered sleeping 0 1 3  Tired, decreased energy 0 0 3  Change in appetite 0 0 3  Feeling bad or failure about yourself  0 0 2  Trouble concentrating 0 0 0  Moving slowly or fidgety/restless 0 0 0  Suicidal thoughts 0 0 0  PHQ-9 Score 0 1 14  Difficult doing work/chores Not difficult at all Not difficult at all   A/P: Depression scores improved. I do think there is some self medication with alcohol- for now continue current medicine- and also encouraged AA   # GERD S:Medication: Omeprazole 40 mg daily-also Pepcid up to twice daily- often needs A/P: wonder if this could contribute to cough- will get ENT opinion but wonder if could benefit from BID PPI- may need to refer back to GI for their opinon on endoscopy    # Hyperglycemia/insulin resistance/prediabetes- peak a1c of 6.6 x1 April 2023 S:  Medication: metformin '500mg'$  daily Exercise and diet- time constraints with exercise but is active with work- had intentionally lost weight now stabilized  A/P: update a1c with labs to monitor.   Recommended follow up: Return in about 6 months (around 11/12/2022) for followup or sooner if needed.Schedule b4 you leave. Future Appointments  Date Time Provider [place]  08/19/2022 11:30 AM [facility] PHLEBOTOMIST [facility] None  08/19/2022 12:00 PM Ladell Pier, MD [facility] None  09/02/2022  9:30  AM [facility] HEALTH COACH [facility]  10/21/2022 11:00 AM [facility] PHARMACIST [facility]   Lab/Order associations:   ICD-10-CM   1. Chronic cough  R05.3     2. Hyperglycemia  R73.9     3. Mixed hyperlipidemia  E78.2     4. Coronary artery disease of native artery of native heart with stable angina pectoris ([place])  I25.118     5. Primary hypertension  I10     6. Need for immunization against influenza  Z23 Flu Vaccine QUAD High Dose(Fluad)      No orders of the defined types were placed in this encounter.   Return precautions advised.  Garret Reddish, MD

## 2022-05-14 LAB — LIPID PANEL
Cholesterol: 123 mg/dL (ref 0–200)
HDL: 71 mg/dL (ref >39.00)
LDL Cholesterol: 41 mg/dL (ref 0–99)
NonHDL: 52.35
Total CHOL/HDL Ratio: 2
Triglycerides: 59 mg/dL (ref 0.0–149.0)
VLDL: 11.8 mg/dL (ref 0.0–40.0)

## 2022-05-14 LAB — COMPREHENSIVE METABOLIC PANEL
ALT: 18 U/L (ref 0–53)
AST: 26 U/L (ref 0–37)
Albumin: 4 g/dL (ref 3.5–5.2)
Alkaline Phosphatase: 62 U/L (ref 39–117)
BUN: 13 mg/dL (ref 6–23)
CO2: 32 mEq/L (ref 19–32)
Calcium: 9.1 mg/dL (ref 8.4–10.5)
Chloride: 102 mEq/L (ref 96–112)
Creatinine, Ser: 0.96 mg/dL (ref 0.40–1.50)
GFR: 79.33 mL/min (ref >60.00)
Glucose, Bld: 104 mg/dL — ABNORMAL HIGH (ref 70–99)
Potassium: 3.8 mEq/L (ref 3.5–5.1)
Sodium: 140 mEq/L (ref 135–145)
Total Bilirubin: 1.1 mg/dL (ref 0.2–1.2)
Total Protein: 6.5 g/dL (ref 6.0–8.3)

## 2022-05-14 LAB — CBC WITH DIFFERENTIAL/PLATELET
Basophils Absolute: 0 10*3/uL (ref 0.0–0.1)
Basophils Relative: 0.6 % (ref 0.0–3.0)
Eosinophils Absolute: 0.1 10*3/uL (ref 0.0–0.7)
Eosinophils Relative: 1.3 % (ref 0.0–5.0)
HCT: 38.3 % — ABNORMAL LOW (ref 39.0–52.0)
Hemoglobin: 12.9 g/dL — ABNORMAL LOW (ref 13.0–17.0)
Lymphocytes Relative: 41.9 % (ref 12.0–46.0)
Lymphs Abs: 2.5 10*3/uL (ref 0.7–4.0)
MCHC: 33.7 g/dL (ref 30.0–36.0)
MCV: 98.7 fl (ref 78.0–100.0)
Monocytes Absolute: 0.5 10*3/uL (ref 0.1–1.0)
Monocytes Relative: 7.9 % (ref 3.0–12.0)
Neutro Abs: 2.8 10*3/uL (ref 1.4–7.7)
Neutrophils Relative %: 48.3 % (ref 43.0–77.0)
Platelets: 116 10*3/uL — ABNORMAL LOW (ref 150.0–400.0)
RBC: 3.88 Mil/uL — ABNORMAL LOW (ref 4.22–5.81)
RDW: 14.7 % (ref 11.5–15.5)
WBC: 5.9 10*3/uL (ref 4.0–10.5)

## 2022-05-14 LAB — HEMOGLOBIN A1C: Hgb A1c MFr Bld: 6.2 % (ref 4.6–6.5)

## 2022-05-29 IMAGING — DX DG LUMBAR SPINE 2-3V
3 series · 3 of 3 positions shown · non-contrast
Comparison: None.

CLINICAL DATA: Low back pain, right lower extremity radiculopathy,
symptoms for 3 weeks

EXAM:
LUMBAR SPINE - 2-3 VIEW

[l-spine ap]
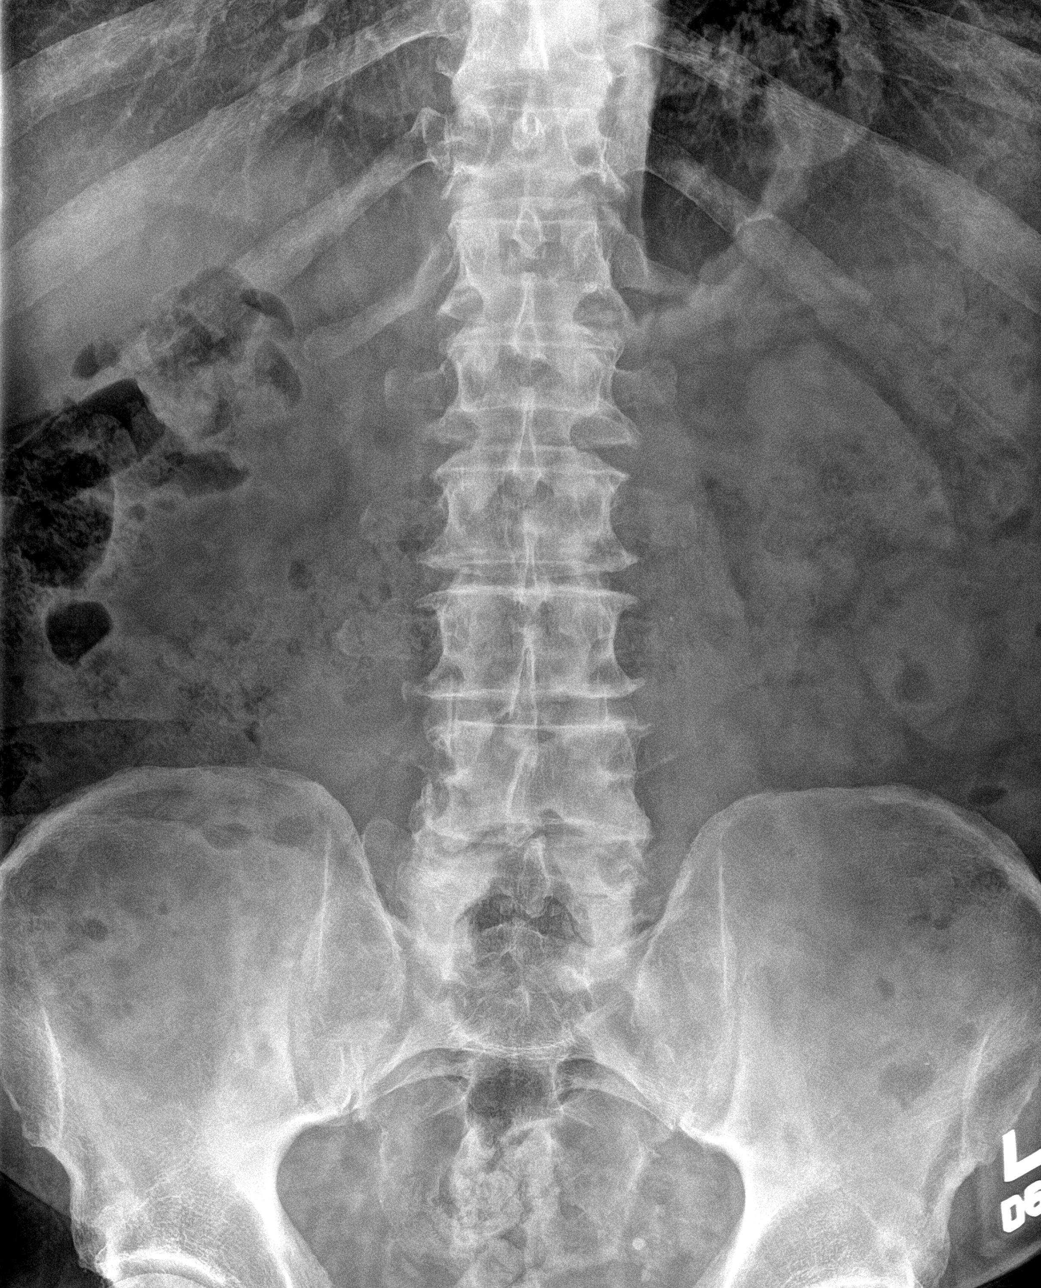

[l-spine lateral (1 of 2)]
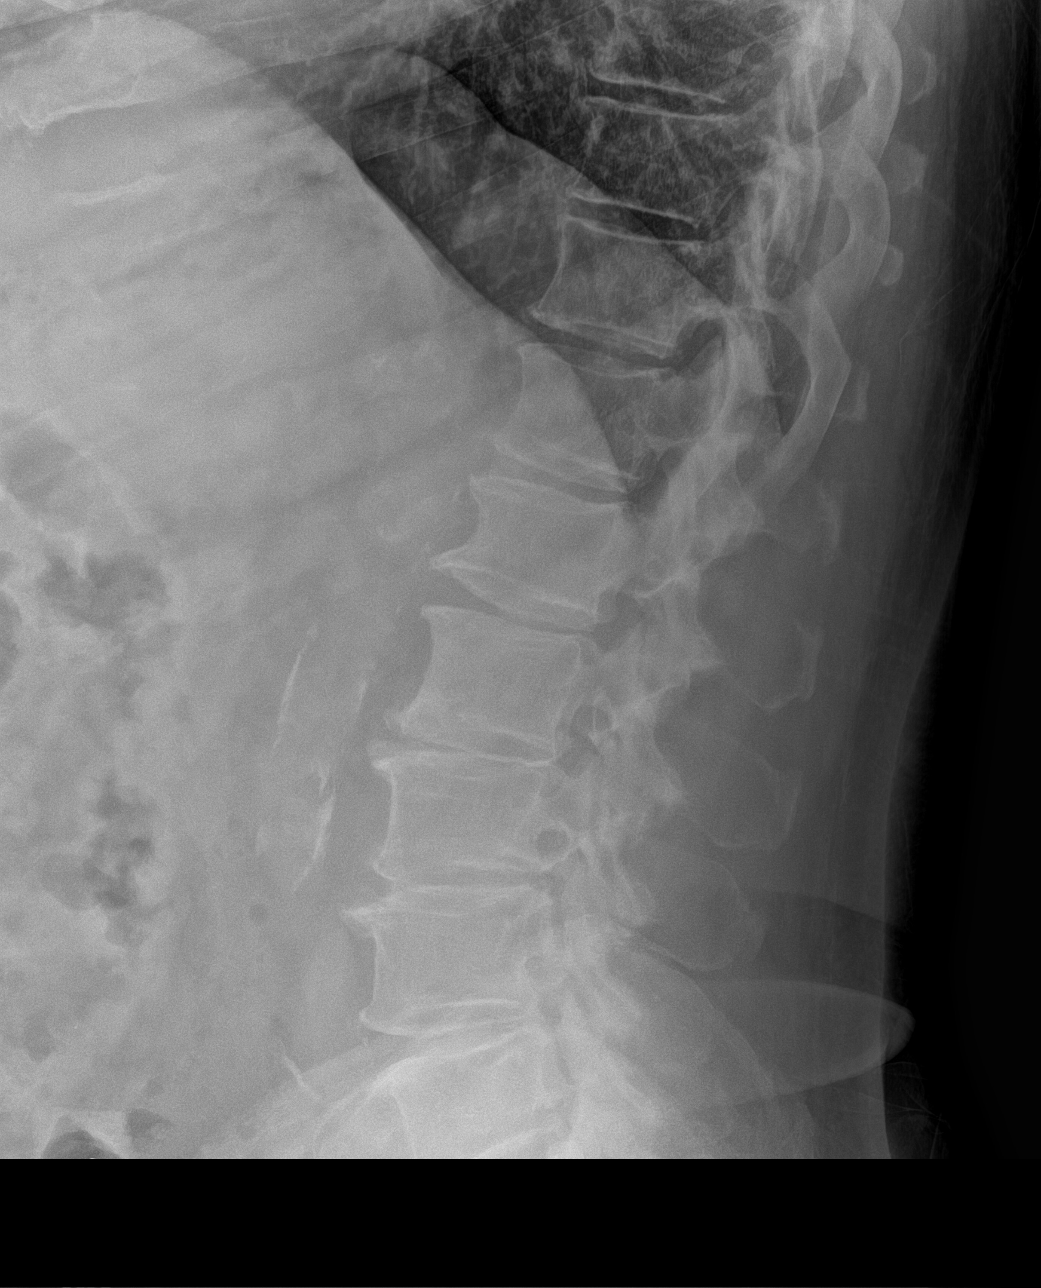

[l-spine lateral (2 of 2)]
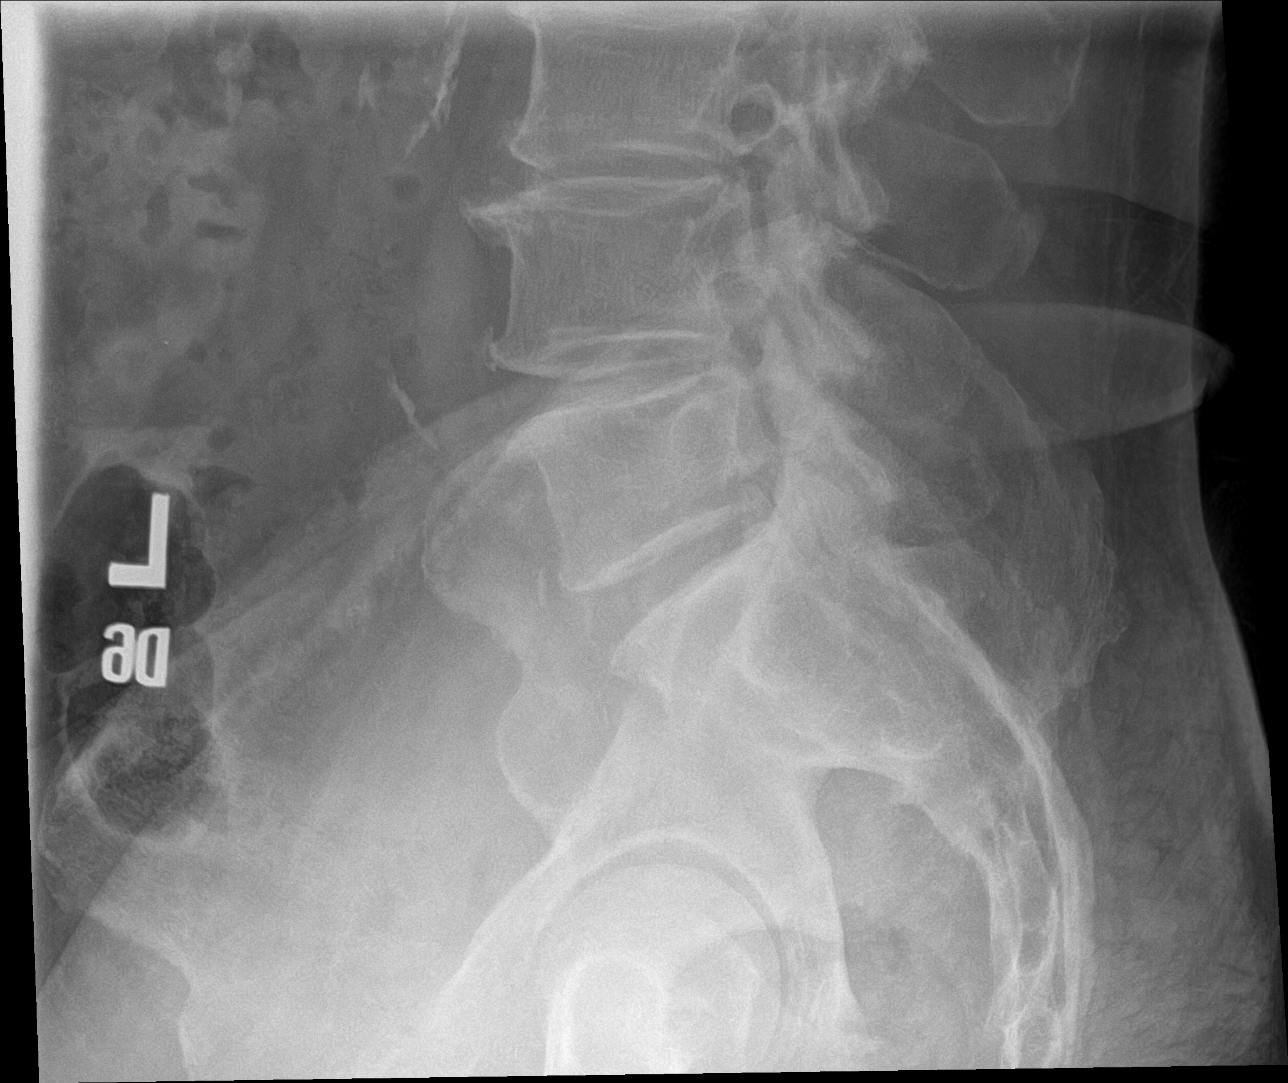

[3 of 3 positions shown; findings below may reference images not displayed]

FINDINGS: Frontal and lateral views of the lumbar spine are obtained. There
are 5 non-rib-bearing lumbar type vertebral bodies in normal
anatomic alignment. No acute displaced fractures. Diffuse lumbar
spondylosis with disc space narrowing and osteophyte formation most
pronounced from L1 through L5. There is diffuse facet hypertrophy,
greatest at the L5-S1 level. Sacroiliac joints are normal. Bowel gas
pattern is unremarkable. Diffuse atherosclerosis of the aorta.
IMPRESSION: 1. Multilevel lumbar spondylosis and facet hypertrophy as above.
2. No acute bony abnormality.

## 2022-06-28 ENCOUNTER — Telehealth: Payer: Self-pay | Admitting: Pharmacist

## 2022-06-28 NOTE — Progress Notes (Signed)
Chronic Care Management Pharmacy Assistant   Name: William Park  MRN: [id number] DOB: Nov 27, 1950   Reason for Encounter: Medication Coordination Call    Recent office visits:  05/13/2022 [facility] (PCP) Marin Olp, MD; Blood pressure at home running above goal into the 140s a fair amount of the time-we will try to reduce and keep him more regularly under 140 by increasing amlodipine to 5 mg   Recent consult visits:  None  Hospital visits:  None in previous 6 months  Medications: Outpatient Encounter Medications as of 06/28/2022  Medication Sig   acetaminophen (TYLENOL) 500 MG tablet Take 1,000 mg by mouth every 6 (six) hours as needed for moderate pain.   amLODipine (NORVASC) 5 MG tablet Take 1 tablet (5 mg total) by mouth daily.   atorvastatin (LIPITOR) 80 MG tablet Take 1 tablet (80 mg total) by mouth daily.   benzonatate (TESSALON) 100 MG capsule Take 1 capsule (100 mg total) by mouth 3 (three) times daily as needed for cough (chronic cough).   busPIRone (BUSPAR) 5 MG tablet Take 1 tablet (5 mg total) by mouth 2 (two) times daily as needed (anxiety).   ELIQUIS 2.5 MG TABS tablet TAKE ONE TABLET BY MOUTH TWICE DAILY   escitalopram (LEXAPRO) 10 MG tablet Take 1 tablet (10 mg total) by mouth daily.   ezetimibe (ZETIA) 10 MG tablet Take 10 mg by mouth daily.   famotidine (PEPCID) 20 MG tablet Take 1 tablet (20 mg total) by mouth 2 (two) times daily.   fluticasone (FLONASE) 50 MCG/ACT nasal spray instill TWO SPRAYS in each nostril ONCE daily   losartan (COZAAR) 50 MG tablet Take 1 tablet (50 mg total) by mouth 2 (two) times daily.   metFORMIN (GLUCOPHAGE) 500 MG tablet Take 1 tablet (500 mg total) by mouth daily with breakfast.   metoprolol tartrate (LOPRESSOR) 25 MG tablet Take 0.5 tablets (12.5 mg total) by mouth 2 (two) times daily.   omeprazole (PRILOSEC) 40 MG capsule Take 1 capsule (40 mg total) by mouth daily. [place].   sildenafil (VIAGRA) 100  MG tablet Take 1 tablet (100 mg total) by mouth daily as needed for erectile dysfunction.   No facility-administered encounter medications on file as of 06/28/2022.   Reviewed chart for medication changes ahead of medication coordination call.  BP Readings from Last 3 Encounters:  05/13/22 132/78  03/18/22 134/80  02/25/22 100/70    Lab Results  Component Value Date   HGBA1C 6.2 05/13/2022     Patient obtains medications through Vials  90 Days   Last adherence delivery included: Amlodipine 5 mg 1 tablet daily - requested new Rx Omeprazole 40 mg once a day Losartan Potassium 50 mg twice a day - requested new Rx Famotidine 20 mg twice daily Buspirone 5 mg three times daily as needed Metoprolol Tartrate 12.5 mg twice daily - break in half Atorvastatin 80 mg once a day Escitalopram 10 mg once daily Ezetimibe 10 mg daily Metformin 500 mg every morning  Patient is due for next adherence delivery on: 07/10/2022. Called patient and reviewed medications and coordinated delivery.  This delivery to include: Amlodipine 5 mg 1 tablet daily  Omeprazole 40 mg once a day Losartan Potassium 50 mg twice a day Famotidine 20 mg twice daily Buspirone 5 mg three times daily as needed Metoprolol Tartrate 12.5 mg twice daily - break in half Atorvastatin 80 mg once a day Escitalopram 10 mg once daily Ezetimibe 10 mg daily  Metformin 500 mg every morning  Patient needs refills for: Atorvastatin 80 mg once a day Omeprazole 40 mg once a day Famotidine 20 mg twice daily  Confirmed delivery date of 07/10/2022, advised patient that pharmacy will contact them the morning of delivery.  Care Gaps: Medicare Annual Wellness: Completed 08/20/2021 Hemoglobin A1C: 6.6% on 11/19/2021 Colonoscopy: Next due on 07/05/2029  Future Appointments  Date Time Provider [place]  08/19/2022 11:30 AM [facility] PHLEBOTOMIST [facility] None  08/19/2022 12:00 PM Ladell Pier, MD [facility] None  09/02/2022   9:30 AM [facility] HEALTH COACH [facility]  10/21/2022 11:00 AM [facility] CCM PHARMACIST [facility]  11/11/2022 10:00 AM Marin Olp, MD [facility]   April D Calhoun, [place] Pharmacist Assistant [phone]

## 2022-07-03 ENCOUNTER — Telehealth: Payer: Self-pay

## 2022-07-03 ENCOUNTER — Ambulatory Visit: Payer: Medicare Other | Attending: Cardiovascular Disease | Admitting: Cardiovascular Disease

## 2022-07-03 ENCOUNTER — Encounter: Payer: Self-pay | Admitting: Cardiovascular Disease

## 2022-07-03 ENCOUNTER — Other Ambulatory Visit: Payer: Self-pay | Admitting: Family Medicine

## 2022-07-03 VITALS — BP 114/64 | HR 456 | Ht 71.0 in | Wt 203.0 lb

## 2022-07-03 DIAGNOSIS — D6851 Activated protein C resistance: Secondary | ICD-10-CM | POA: Insufficient documentation

## 2022-07-03 DIAGNOSIS — E785 Hyperlipidemia, unspecified: Secondary | ICD-10-CM | POA: Diagnosis not present

## 2022-07-03 DIAGNOSIS — Z86711 Personal history of pulmonary embolism: Secondary | ICD-10-CM | POA: Diagnosis not present

## 2022-07-03 DIAGNOSIS — Z7901 Long term (current) use of anticoagulants: Secondary | ICD-10-CM | POA: Diagnosis not present

## 2022-07-03 DIAGNOSIS — Z9861 Coronary angioplasty status: Secondary | ICD-10-CM | POA: Diagnosis not present

## 2022-07-03 DIAGNOSIS — R931 Abnormal findings on diagnostic imaging of heart and coronary circulation: Secondary | ICD-10-CM | POA: Diagnosis not present

## 2022-07-03 DIAGNOSIS — I10 Essential (primary) hypertension: Secondary | ICD-10-CM | POA: Diagnosis not present

## 2022-07-03 DIAGNOSIS — Z5181 Encounter for therapeutic drug level monitoring: Secondary | ICD-10-CM | POA: Insufficient documentation

## 2022-07-03 NOTE — Telephone Encounter (Signed)
Samples: Eliquis 2.5 mg tablets Lot #: [id number] Exp: 10/2022  Received secure chat from J. [place], [place] at [facility] requesting samples for pt.    Samples available at the front desk for pick up

## 2022-07-03 NOTE — Patient Instructions (Signed)
Medication Instructions:  Your physician recommends that you continue on your current medications as directed. Please refer to the Current Medication list given to you today.  PLEASE RESUME TAKING ELIQUIS 2.'5mg'$  TWICE DAILY.  *If you need a refill on your cardiac medications before your next appointment, please call your pharmacy*   Lab Work: NONE If you have labs (blood work) drawn today and your tests are completely normal, you will receive your results only by: [place] (if you have MyChart) OR A paper copy in the mail If you have any lab test that is abnormal or we need to change your treatment, we will call you to review the results.   Testing/Procedures: NONE   Follow-Up: At [facility], you and your health needs are our priority.  As part of our continuing mission to provide you with exceptional heart care, we have created designated Provider Care Teams.  These Care Teams include your primary Cardiologist (physician) and Advanced Practice Providers (APPs -  Physician Assistants and Nurse Practitioners) who all work together to provide you with the care you need, when you need it.  We recommend signing up for the patient portal called "MyChart".  Sign up information is provided on this After Visit Summary.  MyChart is used to connect with patients for Virtual Visits (Telemedicine).  Patients are able to view lab/test results, encounter notes, upcoming appointments, etc.  Non-urgent messages can be sent to your provider as well.   To learn more about what you can do with MyChart, go to [email or web address].    Your next appointment:   1 year(s)  The format for your next appointment:   In Person  Provider:   Shelva Majestic, MD

## 2022-07-03 NOTE — Progress Notes (Signed)
Cardiology Office Note    Date:  07/03/2022   ID:  William Park, DOB Apr 08, 1951, MRN [id number]  PCP:  William Olp, MD  Cardiologist:  William Majestic, MD    10 month F/U  cardiology evaluation initially referred through the courtesy of Dr. Garret Park for establishment of cardiology care.  History of Present Illness:  William Park is a 71 y.o. male who has a history of known CAD, and apparently had undergone remote PTCA by me in 1990 currently had 3 blockages.  He underwent a subsequent PTCA in 1991 and his last catheterization was in 1994.  Subsequently, his insurance changed, and he has been evaluated by Dr. Alroy Park in [place] for his cardiology care.  States his last treadmill test was approximately 2 years ago and he was told that this was "okay."He has had some difficulty with family members with a daughter being a drug addict and having served time in prison and is getting back in prison and another daughter with issues.  He has been followed by Dr. Garret Park for his primary care.  He had undergone a lower extremity arterial duplex study in July 2022 due to leg swelling and there was no evidence for DVT.  After not having seen him in over 25 years, I saw him for my initial evaluation on May 01, 2021 when he presented to establish cardiology care with me again in [place] instead of [place],  [place].  Over the last 10 months, he has started to notice slight progressive development of exertional shortness of breath with less activity.  He denies any chest pressure.  He is unaware of any arrhythmias.  He denies PND or orthopnea.  He has had some back and right leg discomfort.  Has a history of depression and is on risperidone.  He has been on losartan 50 mg twice a day for hypertension, famotidine for GERD in addition at times to omeprazole.  He is on simvastatin 40 mg for hyperlipidemia and apparently has a prescription for sildenafil as needed for  erectile dysfunction.  During my initial evaluation, his blood pressure was elevated insistent with stage II hypertension.  His losartan dose had recently been increased to 50 mg twice a day and at that time I recommended the addition of amlodipine 5 mg to his medical regimen.  With his exertional dyspnea I scheduled him for 2D echo Doppler study.  Since his prior evaluations were PTCA and not stents to further evaluate his coronary obstructive disease I scheduled him for coronary CTA evaluation.  Also recommend laboratory checked including a CMP, CBC, TSH lipid studies and LP(a).  He was in the hospital from October 21 through May 20, 2021.  He had undergone a CT angio of his chest which had been ordered by me on May 18, 2021 which  demonstrated probable acute pulmonary embolism in the right pulmonary arterial branches and middle lobe thrombus.  As result he was hospitalized with PE and was found to have a left DVT on lower extremity venous Doppler study.  He was started on Eliquis 5 mg twice a day.  Of note, his coronary calcium score on his coronary CTA was elevated at 1198 representing 75 percentile for age and sex with moderate CAD in the LAD, first diagonal and circumflex vessels.  FFR was normal in the RCA and circumflex vessel.  In the LAD proximal was 0.96, mid 0.93, and distal was positive at 0.77.  I saw him on May 31, 2021 following his hospitalization at which time his shortness of breath has improved. He was on amlodipine 5 mg, losartan 50 mg, and metoprolol tartrate 12.5 mg twice a day for his hypertension ann Eliquis 5 mg twice a day for anticoagulation.  He is on BuSpar.  During that evaluation, it was recommended that he have a 12-monthfollow-up CT angio of his chest.  His blood pressure remains slightly elevated and I further titrated amlodipine to 7.5 mg and he continued to be on losartan 50 mg daily and metoprolol tartrate 25 mg twice a day.  His lipid panel showed an LDL of 82  despite being on atorvastatin 80 mg and I recommended addition of Zetia 10 mg.  He underwent a follow-up CT angio of his chest on August 28, 2021.  This now showed interval resolution of previously seen pulmonary embolus within the right middle and right lower lobes.  Previously seen small pulmonary nodules or airspace opacity of the anterior right lower lobe abutting the major fissure was also resolved.  There was evidence for coronary artery and aortic atherosclerosis.  Last saw him on September 18, 2021 at which time he felt well and denied chest pain.  At times it was some mild swelling in his left greater than right lower extremity.  At that time he told me that he had had back surgery approximately 3 to 4 months prior to his pulmonary embolism.  His blood pressure was stable on amlodipine 1.5 mg, losartan 50 mg, and metoprolol tartrate 12.5 twice a day.  With the calcium score of 1198 I recommended aggressive lipid-lowering therapy and he continues to be on atorvastatin 80 mg with Zetia 10 mg with target LDL in the 60s or below.  At time he continued to be on Eliquis 5 mg twice a day.  [name] Dr. SAmmie Daltonlast saw him on March 18, 2022.  He also is factor V Leiden heterozygote.  It was recommended that he continue long-term anticoagulation and at that time he was recommended that he can reduce his Eliquis dose to 2.5 mg twice a day.  Presently, Mr. William Severinfeels well.  He denies chest pain or shortness of breath.  He tells me he self discontinued Eliquis 1 month ago due to cost.  He is currently now on amlodipine 5 mg, losartan 50 mg daily and metoprolol tartrate 12.5 mg twice a day for blood pressure control.  He is on atorvastatin 80 mg and Zetia 10 mg for hyperlipidemia.  LDL cholesterol in October 2023 was 41.  He is diabetic on metformin.  He takes omeprazole for GERD.  He presents for evaluation.  Past Medical History:  Diagnosis Date   Allergic rhinitis    allegra and flonase   CAD  (coronary artery disease)    Dr. KCla[place]orne Park/p 4 angioplasty's with last in 956 ASA. simvastatin 842m  Diabetes mellitus without complication (HCC)    DVT (deep venous thrombosis) ([facility]   GERD (gastroesophageal reflux disease)    nexium 4041mnd zantac BID-mout fills with saliva and coughing. [facility]   Hyperlipidemia    simvastatin 32m67miaspan 750mg10mypertension    losartan 50mg 13mocardial infarction (HCC)  [place] MI, mild"- 1994   Osteoarthritis    mainly hands. Mobic 15mg  68mst Surgical History:  Procedure Laterality Date   ANGIOPLASTY     x4   COLONOSCOPY  EYE SURGERY     LUMBAR LAMINECTOMY/DECOMPRESSION MICRODISCECTOMY Right 02/19/2021   Procedure: L4-L5 LAMINOTOMY/FORAMINOTOMY;  Surgeon: Newman Pies, MD;  Location: [facility];  Service: Neurosurgery;  Laterality: Right;  3C   pyloric stenosis     as a child   TONSILLECTOMY     UPPER GASTROINTESTINAL ENDOSCOPY      Current Medications: Outpatient Medications Prior to Visit  Medication Sig Dispense Refill   acetaminophen (TYLENOL) 500 MG tablet Take 1,000 mg by mouth every 6 (six) hours as needed for moderate pain.     amLODipine (NORVASC) 5 MG tablet Take 1 tablet (5 mg total) by mouth daily. 90 tablet 3   atorvastatin (LIPITOR) 80 MG tablet Take 1 tablet (80 mg total) by mouth daily. 90 tablet 3   benzonatate (TESSALON) 100 MG capsule Take 1 capsule (100 mg total) by mouth 3 (three) times daily as needed for cough (chronic cough). 30 capsule 4   busPIRone (BUSPAR) 5 MG tablet Take 1 tablet (5 mg total) by mouth 2 (two) times daily as needed (anxiety). 180 tablet 3   escitalopram (LEXAPRO) 10 MG tablet Take 1 tablet (10 mg total) by mouth daily. 90 tablet 3   ezetimibe (ZETIA) 10 MG tablet Take 10 mg by mouth daily.     famotidine (PEPCID) 20 MG tablet Take 1 tablet (20 mg total) by mouth 2 (two) times daily. 180 tablet 3   fluticasone (FLONASE) 50 MCG/ACT nasal spray instill TWO  SPRAYS in each nostril ONCE daily 48 g 0   losartan (COZAAR) 50 MG tablet Take 1 tablet (50 mg total) by mouth 2 (two) times daily. 180 tablet 3   metFORMIN (GLUCOPHAGE) 500 MG tablet Take 1 tablet (500 mg total) by mouth daily with breakfast. 90 tablet 3   metoprolol tartrate (LOPRESSOR) 25 MG tablet Take 0.5 tablets (12.5 mg total) by mouth 2 (two) times daily. 180 tablet 3   omeprazole (PRILOSEC) 40 MG capsule Take 1 capsule (40 mg total) by mouth daily. [place]. 90 capsule 3   sildenafil (VIAGRA) 100 MG tablet Take 1 tablet (100 mg total) by mouth daily as needed for erectile dysfunction. 10 tablet 11   ELIQUIS 2.5 MG TABS tablet TAKE ONE TABLET BY MOUTH TWICE DAILY 60 tablet 2   No facility-administered medications prior to visit.     Allergies:   Patient has no known allergies.   Social History   Socioeconomic History   Marital status: Divorced    Spouse name: Not on file   Number of children: Not on file   Years of education: Not on file   Highest education level: Not on file  Occupational History   Not on file  Tobacco Use   Smoking status: Former    Packs/day: 2.00    Years: 25.00    Total pack years: 50.00    Types: Cigarettes    Quit date: 07/30/1987    Years since quitting: 34.9   Smokeless tobacco: Never  Vaping Use   Vaping Use: Never used  Substance and Sexual Activity   Alcohol use: Yes   Drug use: No   Sexual activity: Not on file  Other Topics Concern   Not on file  Social History Narrative   Family: Divorced. 2 daughters. 3 granddaughters. Doesn't get to see them.       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   Social Determinants of Health  Financial Resource Strain: Low Risk  (08/20/2021)   Overall Financial Resource Strain (CARDIA)    Difficulty of Paying Living Expenses: Not hard at all  Food Insecurity: No Food Insecurity (08/20/2021)   Hunger Vital Sign    Worried About  Running Out of Food in the Last Year: Never true    Ran Out of Food in the Last Year: Never true  Transportation Needs: No Transportation Needs (08/20/2021)   PRAPARE - Hydrologist (Medical): No    Lack of Transportation (Non-Medical): No  Physical Activity: Inactive (08/20/2021)   Exercise Vital Sign    Days of Exercise per Week: 0 days    Minutes of Exercise per Session: 0 min  Stress: Stress Concern Present (08/20/2021)   [place]    Feeling of Stress : To some extent  Social Connections: Moderately Isolated (08/20/2021)   Social Connection and Isolation Panel [NHANES]    Frequency of Communication with Friends and Family: Twice a week    Frequency of Social Gatherings with Friends and Family: Once a week    Attends Religious Services: 1 to 4 times per year    Active Member of [employer] or Organizations: No    Attends Archivist Meetings: Never    Marital Status: Divorced     Socially he is divorced and lives alone.  He has 2 children.  He previously worked as a as a Administrator.  He denies tobacco use.  He does drink occasional alcohol.  He does not exercise.  Family History:  The patient's family history includes AAA (abdominal aortic aneurysm) in his father; Breast cancer in his mother; CAD in his brother and father; Congestive Heart Failure in his mother; Dementia in his father; Kidney disease in his father.   His mother died at age 71 with cancer.  Father died at 54 and had heart disease and kidney problems.  He has a brother age 75 with heart disease and another brother age 13.  ROS General: Negative; No fevers, chills, or night sweats;  HEENT: Occasional tinnitus,  No changes in vision or hearing, sinus congestion, difficulty swallowing Pulmonary: Negative; No cough, wheezing, shortness of breath, hemoptysis Cardiovascular: Negative; No chest pain, presyncope, syncope,  palpitations GI: Positive for GERD GU: Negative; No dysuria, hematuria, or difficulty voiding Musculoskeletal: Positive for back discomfort, status postlaminectomy and foramina ectomy July 2022. Hematologic/Oncology: Factor V Leiden heterozygous Endocrine: Negative; no heat/cold intolerance; no diabetes Neuro: Negative; no changes in balance, headaches Skin: Negative; No rashes or skin lesions Psychiatric: Positive for depression Sleep: Negative; No snoring, daytime sleepiness, hypersomnolence, bruxism, restless legs, hypnogognic hallucinations, no cataplexy Other comprehensive 14 point system review is negative.   PHYSICAL EXAM:   VS:  BP 114/64   Pulse (!) 456   Ht 5' 11" (1.803 m)   Wt 203 lb (92.1 kg)   SpO2 94%   BMI 28.31 kg/m     Repeat blood pressure by me was 122/66  Wt Readings from Last 3 Encounters:  07/03/22 203 lb (92.1 kg)  05/13/22 199 lb 12.8 oz (90.6 kg)  03/18/22 203 lb (92.1 kg)     General: Alert, oriented, no distress.  Skin: normal turgor, no rashes, warm and dry HEENT: Normocephalic, atraumatic. Pupils equal round and reactive to light; sclera anicteric; extraocular muscles intact;  Nose without nasal septal hypertrophy Mouth/Parynx benign; Mallinpatti scale 3 Neck: No JVD, no carotid bruits; normal carotid upstroke Lungs:  clear to ausculatation and percussion; no wheezing or rales Chest wall: without tenderness to palpitation Heart: PMI not displaced, RRR, s1 s2 normal, 1/6 systolic murmur, no diastolic murmur, no rubs, gallops, thrills, or heaves Abdomen: soft, nontender; no hepatosplenomehaly, BS+; abdominal aorta nontender and not dilated by palpation. Back: no CVA tenderness Pulses 2+ Musculoskeletal: full range of motion, normal strength, no joint deformities Extremities: no clubbing cyanosis or edema, Homan's sign negative  Neurologic: grossly nonfocal; Cranial nerves grossly wnl Psychologic: Normal mood and affect      Studies/Labs  Reviewed:   July 03, 2022 ECG (independently read by me): Sinus bradycardia 56 bpm.  PAC.  Normal intervals.  Nonspecific T wave abnormality  February 21,2023 ECG (independently read by me): Sinus bradycardia at 56, baseline wander  May 31, 2021 ECG (independently read by me):  Sinus bradycardia at 53, normal intervals  May 01, 2021 ECG (independently read by me):  NSR at 82, nonspecific T changes, QTc 486 msec  Recent Labs:    Latest Ref Rng & Units 05/13/2022    2:34 PM 12/17/2021    3:59 PM 11/19/2021    3:07 PM  BMP  Glucose 70 - 99 mg/dL 104  99  90   BUN 6 - 23 mg/dL _0 Creatinine 0.40 - 1.50 mg/dL 0.96  0.93  1.03   Sodium 135 - 145 mEq/L 140  141  141   Potassium 3.5 - 5.1 mEq/L 3.8  4.2  4.2   Chloride 96 - 112 mEq/L 102  101  103   CO2 19 - 32 mEq/L 32  29  30   Calcium 8.4 - 10.5 mg/dL 9.1  9.3  9.0         Latest Ref Rng & Units 05/13/2022    2:34 PM 11/19/2021    3:07 PM 07/10/2021   10:39 AM  Hepatic Function  Total Protein 6.0 - 8.3 g/dL 6.5  6.3  6.3   Albumin 3.5 - 5.2 g/dL 4.0  4.0  3.7   AST 0 - 37 U/L _1 ALT 0 - 53 U/L _2 Alk Phosphatase 39 - 117 U/L 62  86  78   Total Bilirubin 0.2 - 1.2 mg/dL 1.1  1.0  0.9        Latest Ref Rng & Units 05/13/2022    2:34 PM 11/19/2021    3:07 PM 07/10/2021   10:39 AM  CBC  WBC 4.0 - 10.5 K/uL 5.9  8.2  5.6   Hemoglobin 13.0 - 17.0 g/dL 12.9  13.3  12.3   Hematocrit 39.0 - 52.0 % 38.3  39.2  37.0   Platelets 150.0 - 400.0 K/uL 116.0  132.0  196.0    Lab Results  Component Value Date   MCV 98.7 05/13/2022   MCV 96.2 11/19/2021   MCV 97.8 07/10/2021   Lab Results  Component Value Date   TSH 0.78 11/19/2021   Lab Results  Component Value Date   HGBA1C 6.2 05/13/2022     BNP No results found for: "BNP"  ProBNP No results found for: "PROBNP"   Lipid Panel     Component Value Date/Time   CHOL 123 05/13/2022 1434   CHOL 184 05/01/2021 1207   TRIG 59.0  05/13/2022 1434   HDL 71.00 05/13/2022 1434   HDL 82 05/01/2021 1207   CHOLHDL 2 05/13/2022 1434   VLDL 11.8 05/13/2022 1434  [place] 41 05/13/2022 1434   LDLCALC 82 05/01/2021 1207   LDLCALC 62 04/10/2020 1516   LDLDIRECT 74.0 11/19/2021 1507   LABVLDL 20 05/01/2021 1207     RADIOLOGY: No results found.   Additional studies/ records that were reviewed today include:  I reviewed the records of Dr. Garret Park.  I did not have records available from his prior angioplasties between 1990 and 1994.  ECHO: 05/19/2021 IMPRESSIONS   1. Left ventricular ejection fraction, by estimation, is 60 to 65%. The  left ventricle has normal function. The left ventricle has no regional  wall motion abnormalities. Left ventricular diastolic parameters are  indeterminate.   2. Right ventricular systolic function is normal. The right ventricular  size is normal. There is normal pulmonary artery systolic pressure.   3. Left atrial size was mildly dilated.   4. Right atrial size was mildly dilated.   5. The mitral valve is normal in structure. Trivial mitral valve  regurgitation. No evidence of mitral stenosis.   6. The aortic valve is grossly normal. Aortic valve regurgitation is not  visualized. No aortic stenosis is present.   7. The inferior vena cava is normal in size with greater than 50%  respiratory variability, suggesting right atrial pressure of 3 mmHg.   Comparison(s): No prior Echocardiogram.   Conclusion(s)/Recommendation(s): Normal biventricular function without  evidence of hemodynamically significant valvular heart disease. No  evidence of RV enlargement, normal RV function.   ASSESSMENT:    1. Primary hypertension   2. History of PTCAs: [place]   3. Elevated coronary artery calcium score: October 2022 - 1198 agatston   4. Hyperlipidemia with target LDL less than 70   5. Factor V Leiden ([place])   6. Alteration in anticoagulation   7. History of pulmonary embolus  (PE)/DVT:     PLAN:  Mr.  Carvell "Laverna Peace" William Severin is a 71 year old gentleman who has a history of prior CAD and apparently was told of having 3 blockages in 1990 for which he had undergone percutaneous coronary angioplasty procedures.  His last catheterization was in 1994.  Subsequently, he had lost his insurance and had subsequent follow-up cardiology evaluations by Dr. Alroy Park in [facility] and approximately 2 years ago underwent routine treadmill testing and was told that this was apparently "okay."  He reestablish cardiology care with me in early October 2022.  Prior to that evaluation over several months he began to notice a change with development of exertional shortness of breath with less activity.  He denies associated chest tightness or pressure.  He has had issues with back discomfort and had undergone laminectomy and also has had some right leg discomfort.  He has had recent increase in blood pressure requiring titration of medication.  At my initial evaluation on October 4, medications were titrated and amlodipine was started at 5 mg.  I scheduled him to undergo a coronary CTA and on this study he was found to have elevated calcium score 1198 representing 87th percentile for age and sex.  Incidentally, however he was found to have right lower lobe pulmonary arterial filling defect consistent with PE on this nondedicated exam.  Pulmonary embolism was confirmed on a CT angio of his chest and he also was noted to have new middle lobe thrombus addition to his right pulmonary branches top normal RV to LV ratio suggesting possible right heart strain.  Has been maintained on Eliquis anticoagulation.  Subsequent CT showed resolution of his right middle and right lower lobe  pulmonary embolism and resolution of previously seen small pulmonary nodules or airspace opacity of the anterior right lobe abutting the major fissure.  He has subsequently been found to be heterozygous for factor V deficiency. When he  last saw Dr. Ammie Park his dose of Eliquis was reduced to 2.5 mg twice a day in August 2023.  Currently, due to cost issues Mr. White self discontinued Eliquis as of approximately 1 month ago.  I discussed with him potential risk for recurrent DVT/PE particularly with his genetics.  We will try to provide him with samples of Eliquis 2.5 twice daily today was the more recent recommended dose by Dr.Sherill.  His blood pressure today is stable on his medical regimen of amlodipine 5 mg, metoprolol 12.5 twice daily and losartan 50 mg.  He continues to be on aggressive lipid-lowering therapy with atorvastatin 80 mg and Zetia 10 mg and with most recent LDL cholesterol at 41.  He is followed by Dr. Yong Channel for primary care.  He has a follow-up appointment to see Dr.Sherill at the end of January 2024.  From a cardiovascular standpoint, he is stable.  I will see him in 1 year for follow-up evaluation or sooner as needed.    Medication Adjustments/Labs and Tests Ordered: Current medicines are reviewed at length with the patient today.  Concerns regarding medicines are outlined above.  Medication changes, Labs and Tests ordered today are listed in the Patient Instructions below. Patient Instructions  Medication Instructions:  Your physician recommends that you continue on your current medications as directed. Please refer to the Current Medication list given to you today.  PLEASE RESUME TAKING ELIQUIS 2.52m TWICE DAILY.  *If you need a refill on your cardiac medications before your next appointment, please call your pharmacy*   Lab Work: NONE If you have labs (blood work) drawn today and your tests are completely normal, you will receive your results only by: MLinden(if you have MyChart) OR A paper copy in the mail If you have any lab test that is abnormal or we need to change your treatment, we will call you to review the results.   Testing/Procedures: NONE   Follow-Up: At [facility]  you and your health needs are our priority.  As part of our continuing mission to provide you with exceptional heart care, we have created designated Provider Care Teams.  These Care Teams include your primary Cardiologist (physician) and Advanced Practice Providers (APPs -  Physician Assistants and Nurse Practitioners) who all work together to provide you with the care you need, when you need it.  We recommend signing up for the patient portal called "MyChart".  Sign up information is provided on this After Visit Summary.  MyChart is used to connect with patients for Virtual Visits (Telemedicine).  Patients are able to view lab/test results, encounter notes, upcoming appointments, etc.  Non-urgent messages can be sent to your provider as well.   To learn more about what you can do with MyChart, go to [email or web address]    Your next appointment:   1 year(s)  The format for your next appointment:   In Person  Provider:   TShelva Majestic MD     Signed, TShelva Majestic MD  07/03/2022 9:25 AM    [id number] [place]  [id number]: [phone]

## 2022-07-08 ENCOUNTER — Other Ambulatory Visit: Payer: Self-pay | Admitting: Family Medicine

## 2022-07-08 MED ORDER — METOPROLOL TARTRATE 25 MG PO TABS: ORAL_TABLET | ORAL | 3 refills | Status: DC

## 2022-07-08 NOTE — Telephone Encounter (Signed)
-----   Message from April D Calhoun sent at 07/08/2022 10:43 AM EST ----- Regarding: Rx refill request Patient has an upcoming delivery with [facility]. He will need a 90 DS of Metoprolol to complete his order. Please send refills to [facility].  Thank you,  April D Calhoun, [place] Pharmacist Assistant [phone]

## 2022-07-15 DIAGNOSIS — D485 Neoplasm of uncertain behavior of skin: Secondary | ICD-10-CM | POA: Diagnosis not present

## 2022-07-15 DIAGNOSIS — B354 Tinea corporis: Secondary | ICD-10-CM | POA: Diagnosis not present

## 2022-07-15 DIAGNOSIS — L814 Other melanin hyperpigmentation: Secondary | ICD-10-CM | POA: Diagnosis not present

## 2022-07-15 DIAGNOSIS — D1801 Hemangioma of skin and subcutaneous tissue: Secondary | ICD-10-CM | POA: Diagnosis not present

## 2022-07-15 DIAGNOSIS — L578 Other skin changes due to chronic exposure to nonionizing radiation: Secondary | ICD-10-CM | POA: Diagnosis not present

## 2022-07-15 DIAGNOSIS — L570 Actinic keratosis: Secondary | ICD-10-CM | POA: Diagnosis not present

## 2022-07-15 DIAGNOSIS — Z1283 Encounter for screening for malignant neoplasm of skin: Secondary | ICD-10-CM | POA: Diagnosis not present

## 2022-07-31 ENCOUNTER — Telehealth: Payer: Self-pay | Admitting: Pharmacist

## 2022-07-31 NOTE — Progress Notes (Signed)
Reason for Encounter: Medication coordination and delivery  Contacted patient on 07/31/2022 to discuss medications   Recent office visits:  05/13/2022 [facility] (PCP) Marin Olp, MD; we will try to reduce and keep him more regularly under 140 by increasing amlodipine to 5 mg.   Recent consult visits:  07/03/2022 [facility] (Cardiology) Troy Sine, MD; no medication changes.  Hospital visits:  None in previous 6 months  Medications: Outpatient Encounter Medications as of 07/31/2022  Medication Sig   acetaminophen (TYLENOL) 500 MG tablet Take 1,000 mg by mouth every 6 (six) hours as needed for moderate pain.   amLODipine (NORVASC) 5 MG tablet Take 1 tablet (5 mg total) by mouth daily.   atorvastatin (LIPITOR) 80 MG tablet TAKE ONE TABLET BY MOUTH ONCE DAILY   benzonatate (TESSALON) 100 MG capsule Take 1 capsule (100 mg total) by mouth 3 (three) times daily as needed for cough (chronic cough).   busPIRone (BUSPAR) 5 MG tablet Take 1 tablet (5 mg total) by mouth 2 (two) times daily as needed (anxiety).   escitalopram (LEXAPRO) 10 MG tablet Take 1 tablet (10 mg total) by mouth daily.   ezetimibe (ZETIA) 10 MG tablet Take 10 mg by mouth daily.   famotidine (PEPCID) 20 MG tablet TAKE ONE TABLET BY MOUTH TWICE DAILY   fluticasone (FLONASE) 50 MCG/ACT nasal spray instill TWO SPRAYS in each nostril ONCE daily   losartan (COZAAR) 50 MG tablet Take 1 tablet (50 mg total) by mouth 2 (two) times daily.   metFORMIN (GLUCOPHAGE) 500 MG tablet Take 1 tablet (500 mg total) by mouth daily with breakfast.   metoprolol tartrate (LOPRESSOR) 25 MG tablet TAKE 1/2 TABLET BY MOUTH TWICE DAILY (please cut in half FOR patient)   omeprazole (PRILOSEC) 40 MG capsule TAKE ONE CAPSULE BY MOUTH BEFORE BREAKFAST   sildenafil (VIAGRA) 100 MG tablet Take 1 tablet (100 mg total) by mouth daily as needed for erectile dysfunction.   No facility-administered encounter medications on file as of 07/31/2022.   BP Readings from Last 3  Encounters:  07/03/22 114/64  05/13/22 132/78  03/18/22 134/80    Pulse Readings from Last 3 Encounters:  07/03/22 (!) 456  05/13/22 (!) 55  03/18/22 (!) 56    Lab Results  Component Value Date/Time   HGBA1C 6.2 05/13/2022 02:34 PM   HGBA1C 6.6 (H) 11/19/2021 03:07 PM   Lab Results  Component Value Date   CREATININE 0.96 05/13/2022   BUN 13 05/13/2022   GFR 79.33 05/13/2022   GFRNONAA >60 12/17/2021   GFRAA 85 04/10/2020   NA 140 05/13/2022   K 3.8 05/13/2022   CALCIUM 9.1 05/13/2022   CO2 32 05/13/2022     Last adherence delivery date: 07/10/2022      Patient is due for next adherence delivery on: 08/08/2021  Spoke with patient on 07/31/2021 reviewed medications and coordinated delivery.  This delivery to include: Vials  30 Days  Amlodipine 5 mg 1 tablet daily  Omeprazole 40 mg once a day Losartan Potassium 50 mg twice a day Famotidine 20 mg twice daily Buspirone 5 mg three times daily as needed Metoprolol Tartrate 12.5 mg twice daily - break in half Atorvastatin 80 mg once a day Escitalopram 10 mg once daily Ezetimibe 10 mg daily Metformin 500 mg every morning Flonase 50 mcg   No refill request needed.  Confirmed delivery date of 08/08/2021, advised patient that pharmacy will contact them the morning of delivery.   Any concerns about your medications? No  How often do you forget or accidentally miss a dose? Never  Is patient in packaging No  If yes  What is the date on your next pill pack?  Any concerns or issues with your packaging?   Recent blood pressure readings are as follows: 128/60   Cycle dispensing form sent to [facility] for review.  Care Gaps: Medicare Annual Wellness: Completed 08/20/2021 Hemoglobin A1C: 6.6% on 11/19/2021 Colonoscopy: Next due on 07/05/2029  Future Appointments  Date Time Provider [place]  08/19/2022 11:30 AM [facility] PHLEBOTOMIST [facility] None  08/19/2022 12:00 PM Ladell Pier, MD [facility] None   09/02/2022  9:30 AM [facility] HEALTH COACH [facility]  10/21/2022 11:00 AM [facility] PHARMACIST [facility]  11/11/2022 10:00 AM Marin Olp, MD [facility]   April D Calhoun, [facility] Pharmacist Assistant [phone]

## 2022-08-05 DIAGNOSIS — Z961 Presence of intraocular lens: Secondary | ICD-10-CM | POA: Diagnosis not present

## 2022-08-05 DIAGNOSIS — E119 Type 2 diabetes mellitus without complications: Secondary | ICD-10-CM | POA: Diagnosis not present

## 2022-08-05 DIAGNOSIS — H43813 Vitreous degeneration, bilateral: Secondary | ICD-10-CM | POA: Diagnosis not present

## 2022-08-05 DIAGNOSIS — H527 Unspecified disorder of refraction: Secondary | ICD-10-CM | POA: Diagnosis not present

## 2022-08-05 LAB — HM DIABETES EYE EXAM

## 2022-08-06 ENCOUNTER — Other Ambulatory Visit: Payer: Self-pay | Admitting: Cardiovascular Disease

## 2022-08-13 DIAGNOSIS — L648 Other androgenic alopecia: Secondary | ICD-10-CM | POA: Diagnosis not present

## 2022-08-13 DIAGNOSIS — L570 Actinic keratosis: Secondary | ICD-10-CM | POA: Diagnosis not present

## 2022-08-19 ENCOUNTER — Inpatient Hospital Stay: Payer: Medicare Other | Attending: Oncology

## 2022-08-19 ENCOUNTER — Inpatient Hospital Stay (HOSPITAL_BASED_OUTPATIENT_CLINIC_OR_DEPARTMENT_OTHER): Payer: Medicare Other | Admitting: Oncology

## 2022-08-19 VITALS — BP 119/68 | HR 60 | Temp 97.9°F | Resp 18 | Ht 71.0 in | Wt 208.6 lb

## 2022-08-19 DIAGNOSIS — I2699 Other pulmonary embolism without acute cor pulmonale: Secondary | ICD-10-CM

## 2022-08-19 DIAGNOSIS — I2510 Atherosclerotic heart disease of native coronary artery without angina pectoris: Secondary | ICD-10-CM | POA: Insufficient documentation

## 2022-08-19 DIAGNOSIS — R634 Abnormal weight loss: Secondary | ICD-10-CM | POA: Insufficient documentation

## 2022-08-19 DIAGNOSIS — I10 Essential (primary) hypertension: Secondary | ICD-10-CM | POA: Diagnosis not present

## 2022-08-19 DIAGNOSIS — E119 Type 2 diabetes mellitus without complications: Secondary | ICD-10-CM | POA: Insufficient documentation

## 2022-08-19 DIAGNOSIS — Z86718 Personal history of other venous thrombosis and embolism: Secondary | ICD-10-CM | POA: Insufficient documentation

## 2022-08-19 DIAGNOSIS — I252 Old myocardial infarction: Secondary | ICD-10-CM | POA: Diagnosis not present

## 2022-08-19 DIAGNOSIS — Z7289 Other problems related to lifestyle: Secondary | ICD-10-CM | POA: Insufficient documentation

## 2022-08-19 DIAGNOSIS — D6851 Activated protein C resistance: Secondary | ICD-10-CM | POA: Diagnosis not present

## 2022-08-19 DIAGNOSIS — F32A Depression, unspecified: Secondary | ICD-10-CM | POA: Insufficient documentation

## 2022-08-19 DIAGNOSIS — Z79899 Other long term (current) drug therapy: Secondary | ICD-10-CM | POA: Insufficient documentation

## 2022-08-19 LAB — CBC WITH DIFFERENTIAL (CANCER CENTER ONLY)
Abs Immature Granulocytes: 0.02 10*3/uL (ref 0.00–0.07)
Basophils Absolute: 0 10*3/uL (ref 0.0–0.1)
Basophils Relative: 1 %
Eosinophils Absolute: 0.1 10*3/uL (ref 0.0–0.5)
Eosinophils Relative: 1 %
HCT: 36.6 % — ABNORMAL LOW (ref 39.0–52.0)
Hemoglobin: 12.5 g/dL — ABNORMAL LOW (ref 13.0–17.0)
Immature Granulocytes: 0 %
Lymphocytes Relative: 36 %
Lymphs Abs: 2.3 10*3/uL (ref 0.7–4.0)
MCH: 33.6 pg (ref 26.0–34.0)
MCHC: 34.2 g/dL (ref 30.0–36.0)
MCV: 98.4 fL (ref 80.0–100.0)
Monocytes Absolute: 0.6 10*3/uL (ref 0.1–1.0)
Monocytes Relative: 9 %
Neutro Abs: 3.3 10*3/uL (ref 1.7–7.7)
Neutrophils Relative %: 53 %
Platelet Count: 113 10*3/uL — ABNORMAL LOW (ref 150–400)
RBC: 3.72 MIL/uL — ABNORMAL LOW (ref 4.22–5.81)
RDW: 13.5 % (ref 11.5–15.5)
WBC Count: 6.2 10*3/uL (ref 4.0–10.5)
nRBC: 0 % (ref 0.0–0.2)

## 2022-08-19 NOTE — Progress Notes (Signed)
  [facility] OFFICE PROGRESS NOTE   Diagnosis: DVT/pulmonary embolism  INTERVAL HISTORY:   [name] returns as scheduled.  He continues apixaban anticoagulation.  No bleeding.  No symptom of recurrent venous thrombosis.  He feels well.  He is working.  Objective:  Vital signs in last 24 hours:  Blood pressure 119/68, pulse 60, temperature 97.9 F (36.6 C), temperature source Oral, resp. rate 18, height '5\' 11"'$  (1.803 m), weight 208 lb 9.6 oz (94.6 kg), SpO2 99 %.    Resp: Lungs clear bilaterally Cardio: Regular rate and rhythm GI: No hepatosplenomegaly Vascular: Trace edema at the left greater than right lower leg     Lab Results:  Lab Results  Component Value Date   WBC 6.2 08/19/2022   HGB 12.5 (L) 08/19/2022   HCT 36.6 (L) 08/19/2022   MCV 98.4 08/19/2022   PLT 113 (L) 08/19/2022   NEUTROABS 3.3 08/19/2022    CMP  Lab Results  Component Value Date   NA 140 05/13/2022   K 3.8 05/13/2022   CL 102 05/13/2022   CO2 32 05/13/2022   GLUCOSE 104 (H) 05/13/2022   BUN 13 05/13/2022   CREATININE 0.96 05/13/2022   CALCIUM 9.1 05/13/2022   PROT 6.5 05/13/2022   ALBUMIN 4.0 05/13/2022   AST 26 05/13/2022   ALT 18 05/13/2022   ALKPHOS 62 05/13/2022   BILITOT 1.1 05/13/2022   GFRNONAA >60 12/17/2021   GFRAA 85 04/10/2020     Medications: I have reviewed the patient's current medications.   Assessment/Plan: Acute PE, left lower extremity DVT October 2022 Factor V Leiden heterozygote Weight loss  Recent diagnosis diabetes 02/19/2021 right L4-5 laminectomy/foraminotomies CAD, history of MI Hypertension Depression Alcohol use History of mild thrombocytopenia CT abdomen/pelvis 12/20/2021-subtle wall thickening of the cecal tip at the appendiceal orifice Colonoscopy 07/06/2019-diverticuli, internal hemorrhoids Colonoscopy 02/25/2022-polyps removed from the transverse colon-tubular adenomas     Disposition: Mr William Park appears stable.  He will  continue reduced intensity anticoagulation.  I encouraged him to decrease alcohol use.  He will return for an office visit in 8 months.  Betsy Coder, MD  08/19/2022  12:25 PM

## 2022-08-29 ENCOUNTER — Telehealth: Payer: Self-pay | Admitting: Pharmacist

## 2022-08-29 NOTE — Progress Notes (Signed)
Erroneous Encounter

## 2022-09-02 ENCOUNTER — Ambulatory Visit (INDEPENDENT_AMBULATORY_CARE_PROVIDER_SITE_OTHER): Payer: Medicare Other

## 2022-09-02 VITALS — Wt 208.0 lb

## 2022-09-02 DIAGNOSIS — Z0000 Encounter for general adult medical examination without abnormal findings: Secondary | ICD-10-CM

## 2022-09-02 NOTE — Progress Notes (Signed)
I connected with  William Park on 09/02/22 by a audio enabled telemedicine application and verified that I am speaking with the correct person using two identifiers.  Patient Location: Home  Provider Location: Office/Clinic  I discussed the limitations of evaluation and management by telemedicine. The patient expressed understanding and agreed to proceed.   Subjective:   William Park is a 72 y.o. male who presents for Medicare Annual/Subsequent preventive examination.  Review of Systems     Cardiac Risk Factors include: advanced age (>37mn, >>74women);hypertension;male gender;dyslipidemia     Objective:    Today's Vitals   09/02/22 0935  Weight: 208 lb (94.3 kg)   Body mass index is 29.01 kg/m.     09/02/2022    9:39 AM 08/19/2022   12:22 PM 01/15/2022    8:04 AM 12/17/2021    2:07 PM 08/20/2021    9:35 AM 05/18/2021    1:50 PM 05/18/2021   12:21 PM  Advanced Directives  Does Patient Have a Medical Advance Directive? Yes Yes Yes Yes Yes Yes No  Type of AParamedicof [place] will [place] will HCas[place] AdobesLiving will HCoto LaurelLiving will H[place]   Does patient want to make changes to medical advance directive? No - Patient declined No - Patient declined No - Patient declined No - Patient declined  No - Patient declined   Copy of [place] Chart? Yes - validated most recent copy scanned in chart (See row information) Yes - validated most recent copy scanned in chart (See row information) Yes - validated most recent copy scanned in chart (See row information)  Yes - validated most recent copy scanned in chart (See row information)      Current Medications (verified) Outpatient Encounter Medications as of 09/02/2022  Medication Sig   acetaminophen (TYLENOL) 500 MG tablet Take 1,000 mg by mouth every 6 (six) hours as  needed for moderate pain.   amLODipine (NORVASC) 5 MG tablet Take 1 tablet (5 mg total) by mouth daily.   apixaban (ELIQUIS) 2.5 MG TABS tablet Take 2.5 mg by mouth 2 (two) times daily.   atorvastatin (LIPITOR) 80 MG tablet TAKE ONE TABLET BY MOUTH ONCE DAILY   benzonatate (TESSALON) 100 MG capsule Take 1 capsule (100 mg total) by mouth 3 (three) times daily as needed for cough (chronic cough).   busPIRone (BUSPAR) 5 MG tablet Take 1 tablet (5 mg total) by mouth 2 (two) times daily as needed (anxiety).   escitalopram (LEXAPRO) 10 MG tablet Take 1 tablet (10 mg total) by mouth daily.   ezetimibe (ZETIA) 10 MG tablet TAKE ONE TABLET BY MOUTH ONCE DAILY   famotidine (PEPCID) 20 MG tablet TAKE ONE TABLET BY MOUTH TWICE DAILY   fluticasone (FLONASE) 50 MCG/ACT nasal spray instill TWO SPRAYS in each nostril ONCE daily   losartan (COZAAR) 50 MG tablet Take 1 tablet (50 mg total) by mouth 2 (two) times daily.   metFORMIN (GLUCOPHAGE) 500 MG tablet Take 1 tablet (500 mg total) by mouth daily with breakfast.   metoprolol tartrate (LOPRESSOR) 25 MG tablet TAKE 1/2 TABLET BY MOUTH TWICE DAILY (please cut in half FOR patient)   omeprazole (PRILOSEC) 40 MG capsule TAKE ONE CAPSULE BY MOUTH BEFORE BREAKFAST   sildenafil (VIAGRA) 100 MG tablet Take 1 tablet (100 mg total) by mouth daily as needed for erectile dysfunction.   No facility-administered encounter medications  on file as of 09/02/2022.    Allergies (verified) Patient has no known allergies.   History: Past Medical History:  Diagnosis Date   Allergic rhinitis    allegra and flonase   CAD (coronary artery disease)    Dr. [name] s/p 4 angioplasty's with last in 32. ASA. simvastatin '80mg'$    Diabetes mellitus without complication (HCC)    DVT (deep venous thrombosis) ([place]) 04/28/2021   GERD (gastroesophageal reflux disease)    nexium '40mg'$  and zantac BID-mout fills with saliva and coughing. Seeing GI in [place] 12/2014   Hyperlipidemia     simvastatin '80mg'$ , niaspan '750mg'$    Hypertension    losartan '50mg'$    Myocardial infarction ([place])    "small MI, mild"- 1994   Osteoarthritis    mainly hands. Mobic '15mg'$    Past Surgical History:  Procedure Laterality Date   ANGIOPLASTY     x4   COLONOSCOPY     EYE SURGERY     LUMBAR LAMINECTOMY/DECOMPRESSION MICRODISCECTOMY Right 02/19/2021   Procedure: L4-L5 LAMINOTOMY/FORAMINOTOMY;  Surgeon: Newman Pies, MD;  Location: [facility];  Service: Neurosurgery;  Laterality: Right;  3C   pyloric stenosis     as a child   TONSILLECTOMY     UPPER GASTROINTESTINAL ENDOSCOPY     Family History  Problem Relation Age of Onset   Dementia Father        passed age 80   Kidney disease Father        CKD   CAD Father    AAA (abdominal aortic aneurysm) Father    Breast cancer Mother    Congestive Heart Failure Mother        25 time of death   CAD Brother    Colon cancer Neg Hx    Esophageal cancer Neg Hx    Stomach cancer Neg Hx    Rectal cancer Neg Hx    Social History   Socioeconomic History   Marital status: Divorced    Spouse name: Not on file   Number of children: Not on file   Years of education: Not on file   Highest education level: Not on file  Occupational History   Not on file  Tobacco Use   Smoking status: Former    Packs/day: 2.00    Years: 25.00    Total pack years: 50.00    Types: Cigarettes    Quit date: 07/30/1987    Years since quitting: 35.1   Smokeless tobacco: Never  Vaping Use   Vaping Use: Never used  Substance and Sexual Activity   Alcohol use: Yes   Drug use: No   Sexual activity: Not on file  Other Topics Concern   Not on file  Social History Narrative   Family: Divorced. 2 daughters. 3 granddaughters. Doesn't get to see them.       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   Social Determinants of Radio broadcast assistant Strain: Low Risk  (08/20/2021)   Overall Financial Resource Strain (CARDIA)     Difficulty of Paying Living Expenses: Not hard at all  Food Insecurity: No Food Insecurity (09/02/2022)   Hunger Vital Sign    Worried About Running Out of Food in the Last Year: Never true    Ran Out of Food in the Last Year: Never true  Transportation Needs: No Transportation Needs (09/02/2022)   PRAPARE - Hydrologist (Medical): No  Lack of Transportation (Non-Medical): No  Physical Activity: Unknown (08/31/2022)   Exercise Vital Sign    Days of Exercise per Week: Not on file    Minutes of Exercise per Session: 60 min  Stress: Stress Concern Present (08/20/2021)   [place]    Feeling of Stress : To some extent  Social Connections: Moderately Isolated (09/02/2022)   Social Connection and Isolation Panel [NHANES]    Frequency of Communication with Friends and Family: Twice a week    Frequency of Social Gatherings with Friends and Family: Once a week    Attends Religious Services: 1 to 4 times per year    Active Member of [employer] or Organizations: No    Attends Music therapist: Never    Marital Status: Divorced    Tobacco Counseling Counseling given: Not Answered   Clinical Intake:  Pre-visit preparation completed: Yes  Pain : No/denies pain     BMI - recorded: 29.01 Nutritional Status: BMI 25 -29 Overweight Diabetes: No  How often do you need to have someone help you when you read instructions, pamphlets, or other written materials from your doctor or pharmacy?: 1 - Never  Diabetic?no  Interpreter Needed?: No  Information entered by :: Charlott Rakes, LPN   Activities of Daily Living    08/31/2022    3:29 PM  In your present state of health, do you have any difficulty performing the following activities:  Hearing? 0  Vision? 0  Difficulty concentrating or making decisions? 0  Walking or climbing stairs? 0  Dressing or bathing? 0  Doing errands, shopping?  0  Preparing Food and eating ? N  Using the Toilet? N  In the past six months, have you accidently leaked urine? N  Do you have problems with loss of bowel control? N  Managing your Medications? N  Managing your Finances? N  Housekeeping or managing your Housekeeping? N    Patient Care Team: Marin Olp, MD as PCP - General (Family Medicine) Troy Sine, MD as PCP - Cardiology (Cardiology) Renee Ramus, MD as Consulting Physician (Cardiology) Edythe Clarity, [facility] (Pharmacist)  Indicate any recent Medical Services you may have received from other than [facility] providers in the past year (date may be approximate).     Assessment:   This is a routine wellness examination for William Park.  Hearing/Vision screen Hearing Screening - Comments:: Pt denies any hearing issues  Vision Screening - Comments:: Pt follows up with [facility] for annual eye exams   Dietary issues and exercise activities discussed: Current Exercise Habits: Home exercise routine, Type of exercise: Other - see comments (pt work full time as well), Time (Minutes): 60, Frequency (Times/Week): 4, Weekly Exercise (Minutes/Week): 240   Goals Addressed             This Visit's Progress    Patient Stated       Stay healthy        Depression Screen    09/02/2022    9:37 AM 05/13/2022    1:07 PM 11/19/2021    2:43 PM 09/26/2021    2:52 PM 08/20/2021    9:34 AM 07/10/2021   10:51 AM 04/09/2021    2:56 PM  PHQ 2/9 Scores  PHQ - 2 Score 0 0 0 '3 1 2 3  '$ PHQ- 9 Score  0 '1 14  2 5    '$ Fall Risk    08/31/2022  3:29 PM 08/20/2021    9:36 AM 08/28/2020    8:14 AM 04/10/2020    8:58 AM 02/28/2020    4:36 PM  Fall Risk   Falls in the past year? 0 0 0 0 0  Number falls in past yr: 0 0 0 0 0  Injury with Fall? 0 0 0 0 0  Risk for fall due to : Impaired vision Impaired vision  Impaired vision   Risk for fall due to: Comment    just readers   Follow up Falls prevention discussed Falls prevention discussed   Falls prevention discussed     FALL RISK PREVENTION PERTAINING TO THE HOME:  Any stairs in or around the home? Yes  If so, are there any without handrails? No  Home free of loose throw rugs in walkways, pet beds, electrical cords, etc? Yes  Adequate lighting in your home to reduce risk of falls? Yes   ASSISTIVE DEVICES UTILIZED TO PREVENT FALLS:  Life alert? No  Use of a cane, walker or w/c? No  Grab bars in the bathroom? Yes  Shower chair or bench in shower? No  Elevated toilet seat or a handicapped toilet? No   TIMED UP AND GO:  Was the test performed? No .   Cognitive Function:        09/02/2022    9:41 AM 08/20/2021    9:38 AM 04/10/2020    9:00 AM  6CIT Screen  What Year? 0 points 0 points 0 points  What month? 0 points 0 points 0 points  What time? 0 points 0 points   Count back from 20 0 points 0 points 0 points  Months in reverse 0 points 0 points 0 points  Repeat phrase 0 points 0 points 0 points  Total Score 0 points 0 points     Immunizations Immunization History  Administered Date(s) Administered   Fluad Quad(high Dose 65+) 04/10/2020, 04/09/2021, 05/13/2022   Influenza Whole 04/29/2019   Influenza, High Dose Seasonal PF 04/07/2017, 05/12/2018   Influenza,inj,Quad PF,6+ Mos 07/21/2015   Influenza-Unspecified 07/17/2016   Moderna Sars-Covid-2 Vaccination 11/22/2019, 12/20/2019   Pneumococcal Conjugate-13 07/21/2015   Pneumococcal Polysaccharide-23 03/10/2017   Tdap 01/26/2016   Zoster Recombinat (Shingrix) 07/31/2021, 10/10/2021    TDAP status: Up to date  Flu Vaccine status: Up to date  Pneumococcal vaccine status: Up to date  Covid-19 vaccine status: Completed vaccines  Qualifies for Shingles Vaccine? Yes   Zostavax completed Yes   Shingrix Completed?: Yes  Screening Tests Health Maintenance  Topic Date Due   COVID-19 Vaccine (3 - Moderna risk series) 01/17/2020   Hepatitis C Screening  07/25/2098 (Originally 07/08/1969)   Medicare  Annual Wellness (AWV)  09/03/2023   COLONOSCOPY (Pts 45-84yr Insurance coverage will need to be confirmed)  02/25/2025   DTaP/Tdap/Td (2 - Td or Tdap) 01/25/2026   Pneumonia Vaccine 72 Years old  Completed   INFLUENZA VACCINE  Completed   Zoster Vaccines- Shingrix  Completed   HPV VACCINES  Aged Out   Fecal DNA (Cologuard)  Discontinued    Health Maintenance  Health Maintenance Due  Topic Date Due   COVID-19 Vaccine (3 - Moderna risk series) 01/17/2020    Colorectal cancer screening: Type of screening: Colonoscopy. Completed 7/31/823. Repeat every 3 years  Additional Screening:  Hepatitis C Screening: does qualify;  Vision Screening: Recommended annual ophthalmology exams for early detection of glaucoma and other disorders of the eye. Is the patient up to date with their annual eye  exam?  Yes  Who is the provider or what is the name of the office in which the patient attends annual eye exams? [facility]  If pt is not established with a provider, would they like to be referred to a provider to establish care? Yes .   Dental Screening: Recommended annual dental exams for proper oral hygiene  Community Resource Referral / Chronic Care Management: CRR required this visit?  No   CCM required this visit?  No      Plan:     I have personally reviewed and noted the following in the patient's chart:   Medical and social history Use of alcohol, tobacco or illicit drugs  Current medications and supplements including opioid prescriptions. Patient is not currently taking opioid prescriptions. Functional ability and status Nutritional status Physical activity Advanced directives List of other physicians Hospitalizations, surgeries, and ER visits in previous 12 months Vitals Screenings to include cognitive, depression, and falls Referrals and appointments  In addition, I have reviewed and discussed with patient certain preventive protocols, quality metrics, and best  practice recommendations. A written personalized care plan for preventive services as well as general preventive health recommendations were provided to patient.     Willette Brace, LPN   07/29/5518   Nurse Notes: none

## 2022-09-02 NOTE — Patient Instructions (Signed)
[name] , Thank you for taking time to come for your Medicare Wellness Visit. I appreciate your ongoing commitment to your health goals. Please review the following plan we discussed and let me know if I can assist you in the future.   These are the goals we discussed:  Goals       Patient Stated (pt-stated)      Maintain current state of health and functional ability       Patient Stated      Stay healthy      Patient Stated      Live long       Patient Stated      Stay healthy       [place] (see longitudinal plan of care for additional care plan information)  Current Barriers:  Chronic Disease Management support, education, and care coordination needs related to Hypertension, Hyperlipidemia, and GERD   Hypertension BP Readings from Last 3 Encounters:  04/10/20 122/78  02/28/20 120/62  07/06/19 117/67  Pharmacist Clinical Goal(s): Over the next 365 days, patient will work with PharmD and providers to maintain BP goal <130/80 Current regimen:  Losartan 50 mg once daily Interventions: Reviewed home BP readings, potential side - no problems noted Patient self care activities - Over the next 365 days, patient will: Check BP at least once every 1-2, document, and provide at future appointments Ensure daily salt intake < 2300 mg/day  Hyperlipidemia Lab Results  Component Value Date/Time   [facility] 62 04/10/2020 03:16 PM  Pharmacist Clinical Goal(s): Over the next 365 days, patient will work with PharmD and providers to maintain LDL goal < 70 Current regimen:  Simvastatin 40 mg once daily Interventions: Reviewed cholesterol goals, discussed potential side effects - at LDL goal, no side effects noted  Patient self care activities - Over the next 365 days, patient will: Continue current management  Acid reflux (GERD) Pharmacist Clinical Goal(s) Over the next 180 days, patient will work with PharmD and providers to ensure medication  safety Current regimen:  Omeprazole 40 mg once daily Famotidine 20 mg twice daily  Interventions: Reviewed triggers of acid reflux - focus on reduction of caffeine/coffee Discussed possibility of stepdown tx should symptoms be controlled with lifestyle modications Patient self care activities - Over the next 180 days, patient will: Continue current management  Medication management Pharmacist Clinical Goal(s): Over the next 365 days, patient will work with PharmD and providers to maintain optimal medication adherence Current pharmacy: [facility] Interventions Comprehensive medication review performed. Continue current medication management strategy Patient self care activities - Over the next 365 days, patient will: Focus on medication adherence by taking medications as prescribed Report any questions or concerns to PharmD and/or provider(s) Initial goal documentation.      Track and Manage My Blood Pressure-Hypertension      Timeframe:  Long-Range Goal Priority:  High Start Date:      04/15/22                       Expected End Date:     10/13/21                  Follow Up Date 07/15/22/    - check blood pressure 3 times per week    Why is this important?   You won't feel high blood pressure, but it can still hurt your blood vessels.  High blood  pressure can cause heart or kidney problems. It can also cause a stroke.  Making lifestyle changes like losing a little weight or eating less salt will help.  Checking your blood pressure at home and at different times of the day can help to control blood pressure.  If the doctor prescribes medicine remember to take it the way the doctor ordered.  Call the office if you cannot afford the medicine or if there are questions about it.     Notes:         This is a list of the screening recommended for you and due dates:  Health Maintenance  Topic Date Due   COVID-19 Vaccine (3 - Moderna risk series) 01/17/2020    Hepatitis C Screening: USPSTF Recommendation to screen - Ages 18-79 yo.  07/25/2098*   Medicare Annual Wellness Visit  09/03/2023   Colon Cancer Screening  02/25/2025   DTaP/Tdap/Td vaccine (2 - Td or Tdap) 01/25/2026   Pneumonia Vaccine  Completed   Flu Shot  Completed   Zoster (Shingles) Vaccine  Completed   HPV Vaccine  Aged Out   Cologuard (Stool DNA test)  Discontinued  *Topic was postponed. The date shown is not the original due date.    Advanced directives: copies in chart   Conditions/risks identified: stay healthy   Next appointment: Follow up in one year for your annual wellness visit.   Preventive Care 59 Years and Older, Male  Preventive care refers to lifestyle choices and visits with your health care provider that can promote health and wellness. What does preventive care include? A yearly physical exam. This is also called an annual well check. Dental exams once or twice a year. Routine eye exams. Ask your health care provider how often you should have your eyes checked. Personal lifestyle choices, including: Daily care of your teeth and gums. Regular physical activity. Eating a healthy diet. Avoiding tobacco and drug use. Limiting alcohol use. Practicing safe sex. Taking low doses of aspirin every day. Taking vitamin and mineral supplements as recommended by your health care provider. What happens during an annual well check? The services and screenings done by your health care provider during your annual well check will depend on your age, overall health, lifestyle risk factors, and family history of disease. Counseling  Your health care provider may ask you questions about your: Alcohol use. Tobacco use. Drug use. Emotional well-being. Home and relationship well-being. Sexual activity. Eating habits. History of falls. Memory and ability to understand (cognition). Work and work Statistician. Screening  You may have the following tests or  measurements: Height, weight, and BMI. Blood pressure. Lipid and cholesterol levels. These may be checked every 5 years, or more frequently if you are over 70 years old. Skin check. Lung cancer screening. You may have this screening every year starting at age 28 if you have a 30-pack-year history of smoking and currently smoke or have quit within the past 15 years. Fecal occult blood test (FOBT) of the stool. You may have this test every year starting at age 6. Flexible sigmoidoscopy or colonoscopy. You may have a sigmoidoscopy every 5 years or a colonoscopy every 10 years starting at age 40. Prostate cancer screening. Recommendations will vary depending on your family history and other risks. Hepatitis C blood test. Hepatitis B blood test. Sexually transmitted disease (STD) testing. Diabetes screening. This is done by checking your blood sugar (glucose) after you have not eaten for a while (fasting). You may have this done  every 1-3 years. Abdominal aortic aneurysm (AAA) screening. You may need this if you are a current or former smoker. Osteoporosis. You may be screened starting at age 36 if you are at high risk. Talk with your health care provider about your test results, treatment options, and if necessary, the need for more tests. Vaccines  Your health care provider may recommend certain vaccines, such as: Influenza vaccine. This is recommended every year. Tetanus, diphtheria, and acellular pertussis (Tdap, Td) vaccine. You may need a Td booster every 10 years. Zoster vaccine. You may need this after age 54. Pneumococcal 13-valent conjugate (PCV13) vaccine. One dose is recommended after age 77. Pneumococcal polysaccharide (PPSV23) vaccine. One dose is recommended after age 57. Talk to your health care provider about which screenings and vaccines you need and how often you need them. This information is not intended to replace advice given to you by your health care provider. Make sure  you discuss any questions you have with your health care provider. Document Released: 08/11/2015 Document Revised: 04/03/2016 Document Reviewed: 05/16/2015 Elsevier Interactive Patient Education  2017 [employer] Prevention in the Home Falls can cause injuries. They can happen to people of all ages. There are many things you can do to make your home safe and to help prevent falls. What can I do on the outside of my home? Regularly fix the edges of walkways and driveways and fix any cracks. Remove anything that might make you trip as you walk through a door, such as a raised step or threshold. Trim any bushes or trees on the path to your home. Use bright outdoor lighting. Clear any walking paths of anything that might make someone trip, such as rocks or tools. Regularly check to see if handrails are loose or broken. Make sure that both sides of any steps have handrails. Any raised decks and porches should have guardrails on the edges. Have any leaves, snow, or ice cleared regularly. Use sand or salt on walking paths during winter. Clean up any spills in your garage right away. This includes oil or grease spills. What can I do in the bathroom? Use night lights. Install grab bars by the toilet and in the tub and shower. Do not use towel bars as grab bars. Use non-skid mats or decals in the tub or shower. If you need to sit down in the shower, use a plastic, non-slip stool. Keep the floor dry. Clean up any water that spills on the floor as soon as it happens. Remove soap buildup in the tub or shower regularly. Attach bath mats securely with double-sided non-slip rug tape. Do not have throw rugs and other things on the floor that can make you trip. What can I do in the bedroom? Use night lights. Make sure that you have a light by your bed that is easy to reach. Do not use any sheets or blankets that are too big for your bed. They should not hang down onto the floor. Have a firm  chair that has side arms. You can use this for support while you get dressed. Do not have throw rugs and other things on the floor that can make you trip. What can I do in the kitchen? Clean up any spills right away. Avoid walking on wet floors. Keep items that you use a lot in easy-to-reach places. If you need to reach something above you, use a strong step stool that has a grab bar. Keep electrical cords out of the  way. Do not use floor polish or wax that makes floors slippery. If you must use wax, use non-skid floor wax. Do not have throw rugs and other things on the floor that can make you trip. What can I do with my stairs? Do not leave any items on the stairs. Make sure that there are handrails on both sides of the stairs and use them. Fix handrails that are broken or loose. Make sure that handrails are as long as the stairways. Check any carpeting to make sure that it is firmly attached to the stairs. Fix any carpet that is loose or worn. Avoid having throw rugs at the top or bottom of the stairs. If you do have throw rugs, attach them to the floor with carpet tape. Make sure that you have a light switch at the top of the stairs and the bottom of the stairs. If you do not have them, ask someone to add them for you. What else can I do to help prevent falls? Wear shoes that: Do not have high heels. Have rubber bottoms. Are comfortable and fit you well. Are closed at the toe. Do not wear sandals. If you use a stepladder: Make sure that it is fully opened. Do not climb a closed stepladder. Make sure that both sides of the stepladder are locked into place. Ask someone to hold it for you, if possible. Clearly mark and make sure that you can see: Any grab bars or handrails. First and last steps. Where the edge of each step is. Use tools that help you move around (mobility aids) if they are needed. These include: Canes. Walkers. Scooters. Crutches. Turn on the lights when you go  into a dark area. Replace any light bulbs as soon as they burn out. Set up your furniture so you have a clear path. Avoid moving your furniture around. If any of your floors are uneven, fix them. If there are any pets around you, be aware of where they are. Review your medicines with your doctor. Some medicines can make you feel dizzy. This can increase your chance of falling. Ask your doctor what other things that you can do to help prevent falls. This information is not intended to replace advice given to you by your health care provider. Make sure you discuss any questions you have with your health care provider. Document Released: 05/11/2009 Document Revised: 12/21/2015 Document Reviewed: 08/19/2014 Elsevier Interactive Patient Education  2017 [employer].

## 2022-09-05 NOTE — Progress Notes (Signed)
I connected with  William Park on 09/05/22 by a audio enabled telemedicine application and verified that I am speaking with the correct person using two identifiers.  Patient Location: Home  Provider Location: Office/Clinic  I discussed the limitations of evaluation and management by telemedicine. The patient expressed understanding and agreed to proceed.  Patient Medicare AWV questionnaire was completed by the patient on 08/31/22; I have confirmed that all information answered by patient is correct and no changes since this date.          Subjective:   William Park is a 72 y.o. male who presents for Medicare Annual/Subsequent preventive examination.  Review of Systems     Cardiac Risk Factors include: advanced age (>41mn, >>41women);hypertension;male gender;dyslipidemia     Objective:    Today's Vitals   09/02/22 0935  Weight: 208 lb (94.3 kg)   Body mass index is 29.01 kg/m.     09/02/2022    9:39 AM 08/19/2022   12:22 PM 01/15/2022    8:04 AM 12/17/2021    2:07 PM 08/20/2021    9:35 AM 05/18/2021    1:50 PM 05/18/2021   12:21 PM  Advanced Directives  Does Patient Have a Medical Advance Directive? Yes Yes Yes Yes Yes Yes No  Type of AParamedicof AMeeteetseLiving will HVolusiaLiving will HMercersvilleLiving will HSimmsLiving will HBarclaywill   Does patient want to make changes to medical advance directive? No - Patient declined No - Patient declined No - Patient declined No - Patient declined  No - Patient declined   Copy of HAmazoniain Chart? Yes - validated most recent copy scanned in chart (See row information) Yes - validated most recent copy scanned in chart (See row information) Yes - validated most recent copy scanned in chart (See row information)  Yes - validated most recent copy scanned in chart (See row information)       Current Medications (verified) Outpatient Encounter Medications as of 09/02/2022  Medication Sig   acetaminophen (TYLENOL) 500 MG tablet Take 1,000 mg by mouth every 6 (six) hours as needed for moderate pain.   amLODipine (NORVASC) 5 MG tablet Take 1 tablet (5 mg total) by mouth daily.   apixaban (ELIQUIS) 2.5 MG TABS tablet Take 2.5 mg by mouth 2 (two) times daily.   atorvastatin (LIPITOR) 80 MG tablet TAKE ONE TABLET BY MOUTH ONCE DAILY   benzonatate (TESSALON) 100 MG capsule Take 1 capsule (100 mg total) by mouth 3 (three) times daily as needed for cough (chronic cough).   busPIRone (BUSPAR) 5 MG tablet Take 1 tablet (5 mg total) by mouth 2 (two) times daily as needed (anxiety).   escitalopram (LEXAPRO) 10 MG tablet Take 1 tablet (10 mg total) by mouth daily.   ezetimibe (ZETIA) 10 MG tablet TAKE ONE TABLET BY MOUTH ONCE DAILY   famotidine (PEPCID) 20 MG tablet TAKE ONE TABLET BY MOUTH TWICE DAILY   fluticasone (FLONASE) 50 MCG/ACT nasal spray instill TWO SPRAYS in each nostril ONCE daily   losartan (COZAAR) 50 MG tablet Take 1 tablet (50 mg total) by mouth 2 (two) times daily.   metFORMIN (GLUCOPHAGE) 500 MG tablet Take 1 tablet (500 mg total) by mouth daily with breakfast.   metoprolol tartrate (LOPRESSOR) 25 MG tablet TAKE 1/2 TABLET BY MOUTH TWICE DAILY (please cut in half FOR patient)   omeprazole (PRILOSEC) 40  MG capsule TAKE ONE CAPSULE BY MOUTH BEFORE BREAKFAST   sildenafil (VIAGRA) 100 MG tablet Take 1 tablet (100 mg total) by mouth daily as needed for erectile dysfunction.   No facility-administered encounter medications on file as of 09/02/2022.    Allergies (verified) Patient has no known allergies.   History: Past Medical History:  Diagnosis Date   Allergic rhinitis    allegra and flonase   CAD (coronary artery disease)    Dr. [name] s/p 4 angioplasty's with last in 40. ASA. simvastatin '80mg'$    Diabetes mellitus without complication (HCC)    DVT (deep venous  thrombosis) ([place]) 04/28/2021   GERD (gastroesophageal reflux disease)    nexium '40mg'$  and zantac BID-mout fills with saliva and coughing. Seeing GI in [place] 12/2014   Hyperlipidemia    simvastatin '80mg'$ , niaspan '750mg'$    Hypertension    losartan '50mg'$    Myocardial infarction ([place])    "small MI, mild"- 1994   Osteoarthritis    mainly hands. Mobic '15mg'$    Past Surgical History:  Procedure Laterality Date   ANGIOPLASTY     x4   COLONOSCOPY     EYE SURGERY     LUMBAR LAMINECTOMY/DECOMPRESSION MICRODISCECTOMY Right 02/19/2021   Procedure: L4-L5 LAMINOTOMY/FORAMINOTOMY;  Surgeon: Newman Pies, MD;  Location: [facility];  Service: Neurosurgery;  Laterality: Right;  3C   pyloric stenosis     as a child   TONSILLECTOMY     UPPER GASTROINTESTINAL ENDOSCOPY     Family History  Problem Relation Age of Onset   Dementia Father        passed age 25   Kidney disease Father        CKD   CAD Father    AAA (abdominal aortic aneurysm) Father    Breast cancer Mother    Congestive Heart Failure Mother        19 time of death   CAD Brother    Colon cancer Neg Hx    Esophageal cancer Neg Hx    Stomach cancer Neg Hx    Rectal cancer Neg Hx    Social History   Socioeconomic History   Marital status: Divorced    Spouse name: Not on file   Number of children: Not on file   Years of education: Not on file   Highest education level: Not on file  Occupational History   Not on file  Tobacco Use   Smoking status: Former    Packs/day: 2.00    Years: 25.00    Total pack years: 50.00    Types: Cigarettes    Quit date: 07/30/1987    Years since quitting: 35.1   Smokeless tobacco: Never  Vaping Use   Vaping Use: Never used  Substance and Sexual Activity   Alcohol use: Yes   Drug use: No   Sexual activity: Not on file  Other Topics Concern   Not on file  Social History Narrative   Family: Divorced. 2 daughters. 3 granddaughters. Doesn't get to see them.       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   Social Determinants of Radio broadcast assistant Strain: Low Risk  (08/31/2022)   Overall Financial Resource Strain (CARDIA)    Difficulty of Paying Living Expenses: Not hard at all  Food Insecurity: No Food Insecurity (09/02/2022)   Hunger Vital Sign    Worried About Running Out of Food in the Last  Year: Never true    [place] in the Last Year: Never true  Transportation Needs: No Transportation Needs (09/02/2022)   PRAPARE - Hydrologist (Medical): No    Lack of Transportation (Non-Medical): No  Physical Activity: Unknown (08/31/2022)   Exercise Vital Sign    Days of Exercise per Week: Not on file    Minutes of Exercise per Session: 60 min  Stress: No Stress Concern Present (08/31/2022)   [place]    Feeling of Stress : Not at all  Social Connections: Moderately Isolated (09/02/2022)   Social Connection and Isolation Panel [NHANES]    Frequency of Communication with Friends and Family: Twice a week    Frequency of Social Gatherings with Friends and Family: Once a week    Attends Religious Services: 1 to 4 times per year    Active Member of [employer] or Organizations: No    Attends Music therapist: Never    Marital Status: Divorced    Tobacco Counseling Counseling given: Not Answered   Clinical Intake:  Pre-visit preparation completed: Yes  Pain : No/denies pain     BMI - recorded: 29.01 Nutritional Status: BMI 25 -29 Overweight Diabetes: No  How often do you need to have someone help you when you read instructions, pamphlets, or other written materials from your doctor or pharmacy?: 1 - Never  Diabetic?no  Interpreter Needed?: No  Information entered by :: Charlott Rakes, LPN   Activities of Daily Living    08/31/2022    3:29 PM  In your present state of health, do you have any difficulty  performing the following activities:  Hearing? 0  Vision? 0  Difficulty concentrating or making decisions? 0  Walking or climbing stairs? 0  Dressing or bathing? 0  Doing errands, shopping? 0  Preparing Food and eating ? N  Using the Toilet? N  In the past six months, have you accidently leaked urine? N  Do you have problems with loss of bowel control? N  Managing your Medications? N  Managing your Finances? N  Housekeeping or managing your Housekeeping? N    Patient Care Team: Marin Olp, MD as PCP - General (Family Medicine) Troy Sine, MD as PCP - Cardiology (Cardiology) Renee Ramus, MD as Consulting Physician (Cardiology) Edythe Clarity, [facility] (Pharmacist)  Indicate any recent Medical Services you may have received from other than [facility] providers in the past year (date may be approximate).     Assessment:   This is a routine wellness examination for Abdullah.  Hearing/Vision screen Hearing Screening - Comments:: Pt denies any hearing issues  Vision Screening - Comments:: Pt follows up with [facility] for annual eye exams   Dietary issues and exercise activities discussed: Current Exercise Habits: Home exercise routine, Type of exercise: Other - see comments (pt work full time as well), Time (Minutes): 60, Frequency (Times/Week): 4, Weekly Exercise (Minutes/Week): 240   Goals Addressed             This Visit's Progress    Patient Stated       Stay healthy       Depression Screen    09/02/2022    9:37 AM 05/13/2022    1:07 PM 11/19/2021    2:43 PM 09/26/2021    2:52 PM 08/20/2021    9:34 AM 07/10/2021   10:51 AM 04/09/2021    2:56 PM  PHQ 2/9 Scores  PHQ - 2 Score 0 0 0 '3 1 2 3  '$ PHQ- 9 Score  0 '1 14  2 5    '$ Fall Risk    08/31/2022    3:29 PM 08/20/2021    9:36 AM 08/28/2020    8:14 AM 04/10/2020    8:58 AM 02/28/2020    4:36 PM  Fall Risk   Falls in the past year? 0 0 0 0 0  Number falls in past yr: 0 0 0 0 0  Injury with Fall? 0 0 0 0  0  Risk for fall due to : Impaired vision Impaired vision  Impaired vision   Risk for fall due to: Comment    just readers   Follow up Falls prevention discussed Falls prevention discussed  Falls prevention discussed     FALL RISK PREVENTION PERTAINING TO THE HOME:  Any stairs in or around the home? Yes  If so, are there any without handrails? No  Home free of loose throw rugs in walkways, pet beds, electrical cords, etc? Yes  Adequate lighting in your home to reduce risk of falls? Yes   ASSISTIVE DEVICES UTILIZED TO PREVENT FALLS:  Life alert? No  Use of a cane, walker or w/c? No  Grab bars in the bathroom? Yes  Shower chair or bench in shower? No  Elevated toilet seat or a handicapped toilet? No   TIMED UP AND GO:  Was the test performed? No .   Cognitive Function:        09/02/2022    9:41 AM 08/20/2021    9:38 AM 04/10/2020    9:00 AM  6CIT Screen  What Year? 0 points 0 points 0 points  What month? 0 points 0 points 0 points  What time? 0 points 0 points   Count back from 20 0 points 0 points 0 points  Months in reverse 0 points 0 points 0 points  Repeat phrase 0 points 0 points 0 points  Total Score 0 points 0 points     Immunizations Immunization History  Administered Date(s) Administered   Fluad Quad(high Dose 65+) 04/10/2020, 04/09/2021, 05/13/2022   Influenza Whole 04/29/2019   Influenza, High Dose Seasonal PF 04/07/2017, 05/12/2018   Influenza,inj,Quad PF,6+ Mos 07/21/2015   Influenza-Unspecified 07/17/2016   Moderna Sars-Covid-2 Vaccination 11/22/2019, 12/20/2019   Pneumococcal Conjugate-13 07/21/2015   Pneumococcal Polysaccharide-23 03/10/2017   Tdap 01/26/2016   Zoster Recombinat (Shingrix) 07/31/2021, 10/10/2021    TDAP status: Up to date  Flu Vaccine status: Up to date  Pneumococcal vaccine status: Up to date  Covid-19 vaccine status: Completed vaccines  Qualifies for Shingles Vaccine? Yes   Zostavax completed Yes   Shingrix Completed?:  Yes  Screening Tests Health Maintenance  Topic Date Due   COVID-19 Vaccine (3 - Moderna risk series) 01/17/2020   Hepatitis C Screening  07/25/2098 (Originally 07/08/1969)   Medicare Annual Wellness (AWV)  09/03/2023   COLONOSCOPY (Pts 45-23yr Insurance coverage will need to be confirmed)  02/25/2025   DTaP/Tdap/Td (2 - Td or Tdap) 01/25/2026   Pneumonia Vaccine 72 Years old  Completed   INFLUENZA VACCINE  Completed   Zoster Vaccines- Shingrix  Completed   HPV VACCINES  Aged Out   Fecal DNA (Cologuard)  Discontinued    Health Maintenance  Health Maintenance Due  Topic Date Due   COVID-19 Vaccine (3 - Moderna risk series) 01/17/2020    Colorectal cancer screening: Type of screening: Colonoscopy. Completed 7/31/823. Repeat every 3  years  Additional Screening:  Hepatitis C Screening: does qualify;  Vision Screening: Recommended annual ophthalmology exams for early detection of glaucoma and other disorders of the eye. Is the patient up to date with their annual eye exam?  Yes  Who is the provider or what is the name of the office in which the patient attends annual eye exams? [facility]  If pt is not established with a provider, would they like to be referred to a provider to establish care? Yes .   Dental Screening: Recommended annual dental exams for proper oral hygiene  Community Resource Referral / Chronic Care Management: CRR required this visit?  No   CCM required this visit?  No      Plan:     I have personally reviewed and noted the following in the patient's chart:   Medical and social history Use of alcohol, tobacco or illicit drugs  Current medications and supplements including opioid prescriptions. Patient is not currently taking opioid prescriptions. Functional ability and status Nutritional status Physical activity Advanced directives List of other physicians Hospitalizations, surgeries, and ER visits in previous 12 months Vitals Screenings to  include cognitive, depression, and falls Referrals and appointments  In addition, I have reviewed and discussed with patient certain preventive protocols, quality metrics, and best practice recommendations. A written personalized care plan for preventive services as well as general preventive health recommendations were provided to patient.     Willette Brace, LPN   03/01/8183   Nurse Notes: none

## 2022-09-16 DIAGNOSIS — S134XXA Sprain of ligaments of cervical spine, initial encounter: Secondary | ICD-10-CM | POA: Diagnosis not present

## 2022-09-16 DIAGNOSIS — M9903 Segmental and somatic dysfunction of lumbar region: Secondary | ICD-10-CM | POA: Diagnosis not present

## 2022-09-16 DIAGNOSIS — S335XXA Sprain of ligaments of lumbar spine, initial encounter: Secondary | ICD-10-CM | POA: Diagnosis not present

## 2022-09-16 DIAGNOSIS — M9901 Segmental and somatic dysfunction of cervical region: Secondary | ICD-10-CM | POA: Diagnosis not present

## 2022-09-16 DIAGNOSIS — M9902 Segmental and somatic dysfunction of thoracic region: Secondary | ICD-10-CM | POA: Diagnosis not present

## 2022-09-16 DIAGNOSIS — S233XXA Sprain of ligaments of thoracic spine, initial encounter: Secondary | ICD-10-CM | POA: Diagnosis not present

## 2022-09-23 DIAGNOSIS — S134XXA Sprain of ligaments of cervical spine, initial encounter: Secondary | ICD-10-CM | POA: Diagnosis not present

## 2022-09-23 DIAGNOSIS — M9902 Segmental and somatic dysfunction of thoracic region: Secondary | ICD-10-CM | POA: Diagnosis not present

## 2022-09-23 DIAGNOSIS — M9903 Segmental and somatic dysfunction of lumbar region: Secondary | ICD-10-CM | POA: Diagnosis not present

## 2022-09-23 DIAGNOSIS — S335XXA Sprain of ligaments of lumbar spine, initial encounter: Secondary | ICD-10-CM | POA: Diagnosis not present

## 2022-09-23 DIAGNOSIS — M9901 Segmental and somatic dysfunction of cervical region: Secondary | ICD-10-CM | POA: Diagnosis not present

## 2022-09-23 DIAGNOSIS — S233XXA Sprain of ligaments of thoracic spine, initial encounter: Secondary | ICD-10-CM | POA: Diagnosis not present

## 2022-09-25 ENCOUNTER — Other Ambulatory Visit: Payer: Self-pay | Admitting: Family Medicine

## 2022-09-26 ENCOUNTER — Telehealth: Payer: Self-pay

## 2022-09-26 ENCOUNTER — Telehealth: Payer: Self-pay | Admitting: Pharmacist

## 2022-09-26 MED ORDER — LOSARTAN POTASSIUM 50 MG PO TABS: 50.0000 mg | ORAL_TABLET | Freq: Two times a day (BID) | ORAL | 3 refills | Status: DC

## 2022-09-26 NOTE — Telephone Encounter (Signed)
-----   Message from April D Calhoun sent at 09/26/2022  9:57 AM EST ----- Regarding: Rx refill request Patient has an upcoming delivery with [facility]. He will need a 30 DS of Losartan 50 mg to complete his order. Please send refills to [facility].  Thank you,  April D Calhoun, [place] Pharmacist Assistant [phone]

## 2022-09-26 NOTE — Progress Notes (Signed)
Care Management & Coordination Services Pharmacy Team  Reason for Encounter: Medication coordination and delivery  Contacted patient to discuss medications and coordinate delivery from [facility]. Spoke with patient on 09/26/2022  Cycle dispensing form sent to Leata Mouse for review.   Last adherence delivery date: 07/10/2022      Patient is due for next adherence delivery on: 10/04/2022  This delivery to include: Vials  30 Days  Amlodipine 5 mg 1 tablet daily  Omeprazole 40 mg once a day Losartan Potassium 50 mg twice a day Famotidine 20 mg twice daily Buspirone 5 mg three times daily as needed Metoprolol Tartrate 12.5 mg twice daily - break in half Atorvastatin 80 mg once a day Escitalopram 10 mg once daily Ezetimibe 10 mg daily Metformin 500 mg every morning   Patient declined the following medications this month: Flonase 50 mcg  Refills requested from providers include: Losartan Potassium 50 mg twice a day  Confirmed delivery date of 10/04/2022, advised patient that pharmacy will contact them the morning of delivery.   Any concerns about your medications? No  How often do you forget or accidentally miss a dose? Never  Do you use a pillbox? Yes  Is patient in packaging No  If yes  What is the date on your next pill pack?  Any concerns or issues with your packaging?    Chart review: Recent office visits:  None since previous medication coordination call  Recent consult visits:  08/19/2022 OV (Oncology) Ladell Pier, MD; no medication changes indicated.  08/05/2022 OV (Ophthalmology) Nelda Bucks, MD; no medication changes indicated.  Hospital visits:  None in previous 6 months  Medications: Outpatient Encounter Medications as of 09/26/2022  Medication Sig   acetaminophen (TYLENOL) 500 MG tablet Take 1,000 mg by mouth every 6 (six) hours as needed for moderate pain.   amLODipine (NORVASC) 5 MG tablet Take 1 tablet (5 mg total) by mouth daily.    apixaban (ELIQUIS) 2.5 MG TABS tablet Take 2.5 mg by mouth 2 (two) times daily.   atorvastatin (LIPITOR) 80 MG tablet TAKE ONE TABLET BY MOUTH ONCE DAILY   benzonatate (TESSALON) 100 MG capsule Take 1 capsule (100 mg total) by mouth 3 (three) times daily as needed for cough (chronic cough).   busPIRone (BUSPAR) 5 MG tablet Take 1 tablet (5 mg total) by mouth 2 (two) times daily as needed (anxiety).   escitalopram (LEXAPRO) 10 MG tablet Take 1 tablet (10 mg total) by mouth daily.   ezetimibe (ZETIA) 10 MG tablet TAKE ONE TABLET BY MOUTH ONCE DAILY   famotidine (PEPCID) 20 MG tablet TAKE ONE TABLET BY MOUTH TWICE DAILY   fluticasone (FLONASE) 50 MCG/ACT nasal spray instill TWO SPRAYS in each nostril ONCE daily   losartan (COZAAR) 50 MG tablet Take 1 tablet (50 mg total) by mouth 2 (two) times daily.   metFORMIN (GLUCOPHAGE) 500 MG tablet Take 1 tablet (500 mg total) by mouth daily with breakfast.   metoprolol tartrate (LOPRESSOR) 25 MG tablet TAKE 1/2 TABLET BY MOUTH TWICE DAILY (please cut in half FOR patient)   omeprazole (PRILOSEC) 40 MG capsule TAKE ONE CAPSULE BY MOUTH BEFORE BREAKFAST   sildenafil (VIAGRA) 100 MG tablet Take 1 tablet (100 mg total) by mouth daily as needed for erectile dysfunction.   No facility-administered encounter medications on file as of 09/26/2022.   BP Readings from Last 3 Encounters:  08/19/22 119/68  07/03/22 114/64  05/13/22 132/78    Pulse Readings from Last 3  Encounters:  08/19/22 60  07/03/22 (!) 456  05/13/22 (!) 55    Lab Results  Component Value Date/Time   HGBA1C 6.2 05/13/2022 02:34 PM   HGBA1C 6.6 (H) 11/19/2021 03:07 PM   Lab Results  Component Value Date   CREATININE 0.96 05/13/2022   BUN 13 05/13/2022   GFR 79.33 05/13/2022   GFRNONAA >60 12/17/2021   GFRAA 85 04/10/2020   NA 140 05/13/2022   K 3.8 05/13/2022   CALCIUM 9.1 05/13/2022   CO2 32 05/13/2022     Future Appointments  Date Time Provider [place]  10/21/2022  12:00 PM Edythe Clarity, [place] None  11/11/2022 10:00 AM Marin Olp, MD [facility]  04/21/2023 11:30 AM [facility] PHLEBOTOMIST [facility] None  04/21/2023 12:00 PM Ladell Pier, MD [facility] None  09/11/2023  9:15 AM [facility] HEALTH COACH [facility]   April D Calhoun, [place] Pharmacist Assistant [phone]

## 2022-10-09 IMAGING — CR DG LUMBAR SPINE 1V
1 series · 1 of 1 positions shown · non-contrast
Comparison: Lumbar spine MRI-10/15/2020

CLINICAL DATA: Intraoperative localization for L4-L5 laminectomy.

EXAM:
LUMBAR SPINE - 1 VIEW

[lateral]
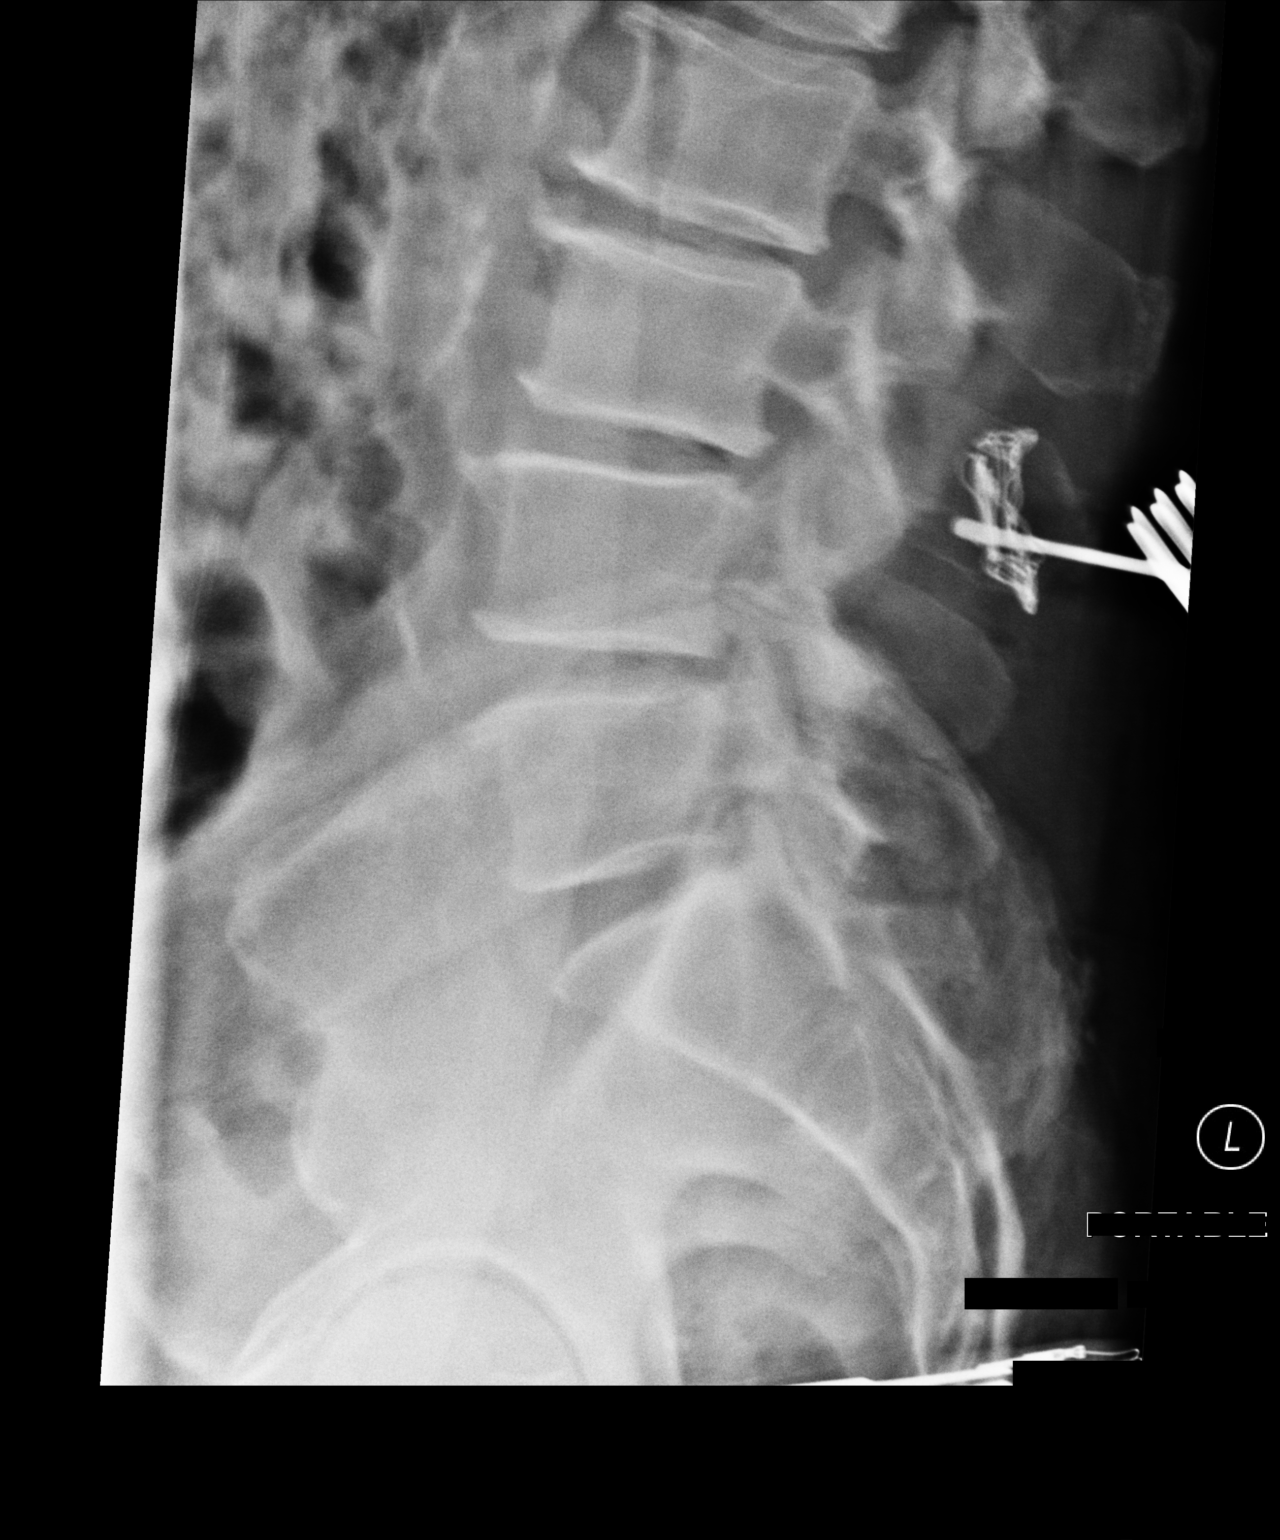

[1 of 1 positions shown; findings below may reference images not displayed]

FINDINGS: Single spot intraoperative radiographic image of the lower lumbar
spine is provided for review.

Spinal labeling is in keeping with preprocedural lumbar spine MRI.

Radiopaque marking instrument overlies the soft tissues posterior to
the L3-L4 intervertebral disc space.
IMPRESSION: Intraoperative localization of L3-L4 as above.

## 2022-10-18 ENCOUNTER — Telehealth: Payer: Self-pay | Admitting: Pharmacist

## 2022-10-18 NOTE — Progress Notes (Signed)
Care Management & Coordination Services Pharmacy Team  Reason for Encounter: Appointment Reminder  Contacted patient to confirm telephone appointment with Leata Mouse, PharmD on 10/21/2022 at 12 pm. Spoke with patient on 10/18/2022    Star Rating Drugs:  Atorvastatin 80 mg last filled 10/02/2022 30 DS Losartan 50 mg last filled 10/02/2022 30 DS Metformin 500 mg last filled 10/02/2022 30 DS   Care Gaps: Annual wellness visit in last year? Yes   Future Appointments  Date Time Provider [place]  10/21/2022 12:00 PM Edythe Clarity, [place] None  11/11/2022 10:00 AM Marin Olp, MD [facility]  04/21/2023 11:30 AM [facility] PHLEBOTOMIST [facility] None  04/21/2023 12:00 PM Ladell Pier, MD [facility] None  09/11/2023  9:15 AM [facility] Cherly Anderson VISIT 1 [facility]   April D Calhoun, [place] Pharmacist Assistant [phone]

## 2022-10-21 ENCOUNTER — Ambulatory Visit: Payer: Medicare Other | Admitting: Pharmacist

## 2022-10-21 NOTE — Progress Notes (Signed)
Care Management & Coordination Services Pharmacy Note  10/22/2022 Name:  William Park MRN:  [id number] DOB:  12-Dec-1950  Summary: PharmD FU visit.  Patient reports today he decided to no longer take Eliquis due to high copay.  [facility] reports copay $455 (deductible) for 30 days.  Unfortunately he does not qualify for PAP due to these meds requiring out of pocket spend.  He does not want Coumadin due to monitoring requirements.  I think copay would be $47 after initial deductible but patient does not want to pay this.    Recommendations/Changes made from today's visit: Discuss with PCP and Dr. Learta Codding options for anticoag with current situation  Follow up plan: 3  months CMA to address plan moving forward   Subjective: William Park is an 72 y.o. year old male who is a primary patient of Yong Channel, Brayton Mars, MD.  The care coordination team was consulted for assistance with disease management and care coordination needs.    Engaged with patient by telephone for follow up visit. Recent office visits:  None since previous medication coordination call   Recent consult visits:  08/19/2022 OV (Oncology) Ladell Pier, MD; no medication changes indicated.   08/05/2022 OV (Ophthalmology) Nelda Bucks, MD; no medication changes indicated.   Hospital visits:  None in previous 6 months   Objective:  Lab Results  Component Value Date   CREATININE 0.96 05/13/2022   BUN 13 05/13/2022   GFR 79.33 05/13/2022   EGFR 69 05/01/2021   GFRNONAA >60 12/17/2021   GFRAA 85 04/10/2020   NA 140 05/13/2022   K 3.8 05/13/2022   CALCIUM 9.1 05/13/2022   CO2 32 05/13/2022   GLUCOSE 104 (H) 05/13/2022    Lab Results  Component Value Date/Time   HGBA1C 6.2 05/13/2022 02:34 PM   HGBA1C 6.6 (H) 11/19/2021 03:07 PM   GFR 79.33 05/13/2022 02:34 PM   GFR 73.15 11/19/2021 03:07 PM    Last diabetic Eye exam:  Lab Results  Component Value Date/Time   HMDIABEYEEXA No  Retinopathy 08/05/2022 12:00 AM    Last diabetic Foot exam: No results found for: "HMDIABFOOTEX"   Lab Results  Component Value Date   CHOL 123 05/13/2022   HDL 71.00 05/13/2022   LDLCALC 41 05/13/2022   LDLDIRECT 74.0 11/19/2021   TRIG 59.0 05/13/2022   CHOLHDL 2 05/13/2022       Latest Ref Rng & Units 05/13/2022    2:34 PM 11/19/2021    3:07 PM 07/10/2021   10:39 AM  Hepatic Function  Total Protein 6.0 - 8.3 g/dL 6.5  6.3  6.3   Albumin 3.5 - 5.2 g/dL 4.0  4.0  3.7   AST 0 - 37 U/L 26  21  24    ALT 0 - 53 U/L 18  23  24    Alk Phosphatase 39 - 117 U/L 62  86  78   Total Bilirubin 0.2 - 1.2 mg/dL 1.1  1.0  0.9     Lab Results  Component Value Date/Time   TSH 0.78 11/19/2021 03:07 PM   TSH 1.010 05/01/2021 12:07 PM       Latest Ref Rng & Units 08/19/2022   11:55 AM 05/13/2022    2:34 PM 11/19/2021    3:07 PM  CBC  WBC 4.0 - 10.5 K/uL 6.2  5.9  8.2   Hemoglobin 13.0 - 17.0 g/dL 12.5  12.9  13.3   Hematocrit 39.0 - 52.0 % 36.6  38.3  39.2   Platelets 150 - 400 K/uL 113  116.0  132.0     No results found for: "VD25OH", "VITAMINB12"  Clinical ASCVD: Yes  The ASCVD Risk score (Arnett DK, et al., 2019) failed to calculate for the following reasons:   The patient has a prior MI or stroke diagnosis        09/02/2022    9:37 AM 05/13/2022    1:07 PM 11/19/2021    2:43 PM  Depression screen PHQ 2/9  Decreased Interest 0 0 0  Down, Depressed, Hopeless 0 0 0  PHQ - 2 Score 0 0 0  Altered sleeping  0 1  Tired, decreased energy  0 0  Change in appetite  0 0  Feeling bad or failure about yourself   0 0  Trouble concentrating  0 0  Moving slowly or fidgety/restless  0 0  Suicidal thoughts  0 0  PHQ-9 Score  0 1  Difficult doing work/chores  Not difficult at all Not difficult at all     Social History   Tobacco Use  Smoking Status Former   Packs/day: 2.00   Years: 25.00   Additional pack years: 0.00   Total pack years: 50.00   Types: Cigarettes   Quit date:  07/30/1987   Years since quitting: 35.2  Smokeless Tobacco Never   BP Readings from Last 3 Encounters:  08/19/22 119/68  07/03/22 114/64  05/13/22 132/78   Pulse Readings from Last 3 Encounters:  08/19/22 60  07/03/22 (!) 456  05/13/22 (!) 55   Wt Readings from Last 3 Encounters:  09/02/22 208 lb (94.3 kg)  08/19/22 208 lb 9.6 oz (94.6 kg)  07/03/22 203 lb (92.1 kg)   BMI Readings from Last 3 Encounters:  09/02/22 29.01 kg/m  08/19/22 29.09 kg/m  07/03/22 28.31 kg/m    No Known Allergies  Medications Reviewed Today     Reviewed by Edythe Clarity, [facility] (Pharmacist) on 10/22/22 at 0929  Med List Status: <None>   Medication Order Taking? Sig Documenting Provider Last Dose Status Informant  acetaminophen (TYLENOL) 500 MG tablet [id number] Yes Take 1,000 mg by mouth every 6 (six) hours as needed for moderate pain. [provider] Taking Active Self  amLODipine (NORVASC) 5 MG tablet [id number] Yes Take 1 tablet (5 mg total) by mouth daily. Marin Olp, MD Taking Active   apixaban [facility]) 2.5 MG TABS tablet [id number] Yes Take 2.5 mg by mouth 2 (two) times daily. [provider] Taking Active   atorvastatin (LIPITOR) 80 MG tablet [id number] Yes TAKE ONE TABLET BY MOUTH ONCE DAILY Marin Olp, MD Taking Active   benzonatate (TESSALON) 100 MG capsule [id number] Yes Take 1 capsule (100 mg total) by mouth 3 (three) times daily as needed for cough (chronic cough). Marin Olp, MD Taking Active   busPIRone (BUSPAR) 5 MG tablet [id number] Yes Take 1 tablet (5 mg total) by mouth 2 (two) times daily as needed (anxiety). Marin Olp, MD Taking Active   escitalopram (LEXAPRO) 10 MG tablet [id number] Yes TAKE ONE TABLET BY MOUTH ONCE DAILY Marin Olp, MD Taking Active   ezetimibe (ZETIA) 10 MG tablet [id number] Yes TAKE ONE TABLET BY MOUTH ONCE DAILY Croitoru, Mihai, MD Taking Active   famotidine (PEPCID) 20 MG tablet [id number] Yes TAKE ONE TABLET  BY MOUTH TWICE DAILY Marin Olp, MD Taking Active   fluticasone (FLONASE) 50 MCG/ACT nasal spray [id number] Yes instill TWO SPRAYS in each nostril ONCE daily  Marin Olp, MD Taking Active   losartan (COZAAR) 50 MG tablet [id number] Yes Take 1 tablet (50 mg total) by mouth 2 (two) times daily. Marin Olp, MD Taking Active   metFORMIN (GLUCOPHAGE) 500 MG tablet [id number] Yes TAKE ONE TABLET BY MOUTH EVERY MORNING with breakfast Marin Olp, MD Taking Active   metoprolol tartrate (LOPRESSOR) 25 MG tablet [id number] Yes TAKE 1/2 TABLET BY MOUTH TWICE DAILY (please cut in half FOR patient) Marin Olp, MD Taking Active   omeprazole (PRILOSEC) 40 MG capsule [id number] Yes TAKE ONE CAPSULE BY MOUTH BEFORE BREAKFAST Marin Olp, MD Taking Active   sildenafil (VIAGRA) 100 MG tablet [id number] Yes Take 1 tablet (100 mg total) by mouth daily as needed for erectile dysfunction. Marin Olp, MD Taking Active             SDOH:  (Social Determinants of Health) assessments and interventions performed: No, done within year Financial Resource Strain: Low Risk  (08/31/2022)   Overall Financial Resource Strain (CARDIA)    Difficulty of Paying Living Expenses: Not hard at all   Food Insecurity: No Food Insecurity (09/02/2022)   Hunger Vital Sign    Worried About Running Out of Food in the Last Year: Never true    Ran Out of Food in the Last Year: Never true    SDOH Interventions    Flowsheet Row Clinical Support from 09/02/2022 in [place] Visit from 04/09/2021 in [place] Visit from 04/10/2020 in [place] Visit from 02/28/2020 in [place] Visit from 11/10/2018 in [place] from 12/09/2017 in [place] Interventions Intervention Not  Indicated -- -- -- -- --  Housing Interventions Intervention Not Indicated -- -- -- -- --  Transportation Interventions Intervention Not Indicated -- -- -- -- --  Utilities Interventions Intervention Not Indicated -- -- -- -- --  Alcohol Usage Interventions Intervention Not Indicated (Score <7) -- -- -- -- --  Depression Interventions/Treatment  -- Currently on Treatment Medication Medication, Counseling Counseling --  [NA]  Financial Strain Interventions Intervention Not Indicated -- -- -- -- --  Physical Activity Interventions Intervention Not Indicated -- -- -- -- --  Stress Interventions Intervention Not Indicated -- -- -- -- --  Social Connections Interventions Intervention Not Indicated -- -- -- -- --       Medication Assistance: None required.  Patient affirms current coverage meets needs.  Medication Access: Within the past 30 days, how often has patient missed a dose of medication? 30 (eliquis) Is a pillbox or other method used to improve adherence? Yes  Factors that may affect medication adherence? financial need Are meds synced by current pharmacy? Yes  Are meds delivered by current pharmacy? Yes  Does patient experience delays in picking up medications due to transportation concerns? No   [facility] Reviewed: Is patient disadvantaged to use [facility]?: No  Current Rx insurance plan: Medicare Name and location of Current pharmacy:  [facility] - [place], [place] - [address] [place] [address] Phone: [phone] Fax: [phone]  [facility] services reviewed with patient today?: Yes  Patient requests to transfer care to [facility]?: No  Reason patient declined to change pharmacies: Patient is already actively enrolled with [facility]  Compliance/Adherence/Medication fill history: [facility]  Rating Drugs:  Atorvastatin 80 mg last filled 10/02/2022 30 DS Losartan 50 mg last  filled 10/02/2022 30 DS Metformin 500 mg last filled 10/02/2022 30 DS     Care Gaps: Annual wellness visit in last year? Yes   Assessment/Plan     Hypertension (BP goal <130/80) -Controlled, based on in office readings -Current treatment: Amlodipine 2.5mg  Appropriate, Effective, Safe, Accessible Losartan 50mg  Appropriate, Effective, Safe, Accessible Metoprolol tartrate 12.5mg  bid Appropriate, Effective, Safe, Accessible -Medications previously tried: none noted -Current home readings:  120-130/60/70s -Denies hypotensive/hypertensive symptoms -Educated on BP goals and benefits of medications for prevention of heart attack, stroke and kidney damage; -Counseled to monitor BP at home a few times per week, document, and provide log at future appointments -Recommended to continue current medication Have him monitor at home, CMA to FU on BP next month  Hx of DVT/PE (Goal: Prevent Recurrence) 10/21/22 -Not ideally controlled -Current treatment  None -Medications previously tried: Eliquis ($) -Patient reports to me today that he stopped taking his Eliquis on his own.  Copay burden is causing him to not take this medication.  Reached out to [facility] and patient copay for first 30 days would be $455 due to a deductible.  Unclear what copay would be after that.  Programs for copay assistance require OOP before meeting requirements for free.  Will consult PCP and Dr. Learta Codding to determine best course of action.  He did not want to do Warfarin due to monitoring requirements.  Explained the risk of no anticoagulation and patient is aware.    Hyperlipidemia: (LDL goal < 70) 10/21/22 -Controlled, LDL 41 at last labs  -Current treatment: Atorvastatin 80mg  Appropriate, Effective, Safe, Accessible Zetia 10mg  Appropriate, Effective, Safe, Accessible -Medications previously tried: none noted  -Current dietary patterns: same see previous -Current exercise habits: same see  previous -Educated on Cholesterol goals;  Benefits of statin for ASCVD risk reduction; -Adherence is 100% - tolerating medications well. -Big improvement since last labs, continue adherence, no changes needed at this time.       Beverly Milch, PharmD Clinical Pharmacist  [facility] [phone]

## 2022-10-22 ENCOUNTER — Other Ambulatory Visit: Payer: Self-pay | Admitting: Family Medicine

## 2022-10-28 ENCOUNTER — Telehealth: Payer: Self-pay | Admitting: Pharmacist

## 2022-10-28 NOTE — Progress Notes (Signed)
Care Management & Coordination Services Pharmacy Team  Reason for Encounter: Medication coordination and delivery  Contacted patient to discuss medications and coordinate delivery from [facility]. Spoke with patient on 10/28/2022  Cycle dispensing form sent to [facility] for review.   Last adherence delivery date: 10/04/22      Patient is due for next adherence delivery on: 11/05/22  This delivery to include: Vials  30 Days   escitalopram 10mg  metoprolol tar 25mg  losartan pot 50mg  atorvastatin 80mg  omeprazole 40mg  famotidine 20mg  ezetimibe 10mg  metformin 500mg  amlodipine 5mg  buspirone 5mg   Patient declined the following medications this month:   Refills requested from providers include: (PCP) losartan pot 50mg  for 30 day supply  Confirmed delivery date of 11/05/22, advised patient that pharmacy will contact them the morning of delivery.   Any concerns about your medications? No  How often do you forget or accidentally miss a dose? Never  Do you use a pillbox? Yes  Is patient in packaging No  If yes  What is the date on your next pill pack?  Any concerns or issues with your packaging?    Medications: Outpatient Encounter Medications as of 10/28/2022  Medication Sig   acetaminophen (TYLENOL) 500 MG tablet Take 1,000 mg by mouth every 6 (six) hours as needed for moderate pain.   amLODipine (NORVASC) 5 MG tablet Take 1 tablet (5 mg total) by mouth daily.   apixaban (ELIQUIS) 2.5 MG TABS tablet Take 2.5 mg by mouth 2 (two) times daily.   atorvastatin (LIPITOR) 80 MG tablet TAKE ONE TABLET BY MOUTH ONCE DAILY   benzonatate (TESSALON) 100 MG capsule Take 1 capsule (100 mg total) by mouth 3 (three) times daily as needed for cough (chronic cough).   busPIRone (BUSPAR) 5 MG tablet Take 1 tablet (5 mg total) by mouth 2 (two) times daily as needed (anxiety).   escitalopram (LEXAPRO) 10 MG tablet TAKE ONE TABLET BY MOUTH ONCE DAILY   ezetimibe (ZETIA) 10 MG tablet TAKE  ONE TABLET BY MOUTH ONCE DAILY   famotidine (PEPCID) 20 MG tablet TAKE ONE TABLET BY MOUTH TWICE DAILY   fluticasone (FLONASE) 50 MCG/ACT nasal spray instill TWO SPRAYS in each nostril ONCE daily   losartan (COZAAR) 50 MG tablet Take 1 tablet (50 mg total) by mouth 2 (two) times daily.   metFORMIN (GLUCOPHAGE) 500 MG tablet TAKE ONE TABLET BY MOUTH EVERY MORNING with breakfast   metoprolol tartrate (LOPRESSOR) 25 MG tablet TAKE 1/2 TABLET BY MOUTH TWICE DAILY (please cut in half FOR patient)   omeprazole (PRILOSEC) 40 MG capsule TAKE ONE CAPSULE BY MOUTH BEFORE BREAKFAST   sildenafil (VIAGRA) 100 MG tablet Take 1 tablet (100 mg total) by mouth daily as needed for erectile dysfunction.   No facility-administered encounter medications on file as of 10/28/2022.   BP Readings from Last 3 Encounters:  08/19/22 119/68  07/03/22 114/64  05/13/22 132/78    Pulse Readings from Last 3 Encounters:  08/19/22 60  07/03/22 (!) 456  05/13/22 (!) 55    Lab Results  Component Value Date/Time   HGBA1C 6.2 05/13/2022 02:34 PM   HGBA1C 6.6 (H) 11/19/2021 03:07 PM   Lab Results  Component Value Date   CREATININE 0.96 05/13/2022   BUN 13 05/13/2022   GFR 79.33 05/13/2022   GFRNONAA >60 12/17/2021   GFRAA 85 04/10/2020   NA 140 05/13/2022   K 3.8 05/13/2022   CALCIUM 9.1 05/13/2022   CO2 32 05/13/2022     Future Appointments  Date Time Provider [place]  11/11/2022 10:00 AM Marin Olp, MD [facility]  04/21/2023 11:30 AM [facility] PHLEBOTOMIST [facility] None  04/21/2023 12:00 PM Ladell Pier, MD [facility] None  09/11/2023  9:15 AM [facility] ANNUAL WELLNESS VISIT 1 [facility] [place], [facility]

## 2022-10-29 ENCOUNTER — Other Ambulatory Visit (HOSPITAL_BASED_OUTPATIENT_CLINIC_OR_DEPARTMENT_OTHER): Payer: Self-pay

## 2022-10-29 ENCOUNTER — Encounter: Payer: Self-pay | Admitting: *Deleted

## 2022-10-29 ENCOUNTER — Other Ambulatory Visit: Payer: Self-pay | Admitting: *Deleted

## 2022-10-29 MED ORDER — APIXABAN 2.5 MG PO TABS
2.5000 mg | ORAL_TABLET | Freq: Two times a day (BID) | ORAL | 0 refills | Status: DC
  Filled 2022-10-29: qty 60, 30d supply, fill #0

## 2022-10-29 NOTE — Progress Notes (Signed)
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 [facility] out-of-pocket is $455 and this is cost prohibitive for him. He does not want warfarin. Script sent to [facility] for cost. Will start patient assistance paperwork w/[employer] if Mr. White agrees. Per [place]: They can provide the free 1 month supply copay card, then if no patient assistance, his plan deductible per year is $455. After this each refill will be $20.70 Left VM asking him to call back to inform nurse how he wants to proceed.

## 2022-11-02 ENCOUNTER — Other Ambulatory Visit (HOSPITAL_BASED_OUTPATIENT_CLINIC_OR_DEPARTMENT_OTHER): Payer: Self-pay

## 2022-11-11 ENCOUNTER — Ambulatory Visit (INDEPENDENT_AMBULATORY_CARE_PROVIDER_SITE_OTHER): Payer: Medicare Other | Admitting: Family Medicine

## 2022-11-11 ENCOUNTER — Encounter: Payer: Self-pay | Admitting: Family Medicine

## 2022-11-11 VITALS — BP 122/70 | HR 53 | Temp 97.4°F | Ht 71.0 in | Wt 198.8 lb

## 2022-11-11 DIAGNOSIS — I2782 Chronic pulmonary embolism: Secondary | ICD-10-CM | POA: Diagnosis not present

## 2022-11-11 DIAGNOSIS — E782 Mixed hyperlipidemia: Secondary | ICD-10-CM | POA: Diagnosis not present

## 2022-11-11 DIAGNOSIS — R739 Hyperglycemia, unspecified: Secondary | ICD-10-CM | POA: Diagnosis not present

## 2022-11-11 DIAGNOSIS — F325 Major depressive disorder, single episode, in full remission: Secondary | ICD-10-CM

## 2022-11-11 DIAGNOSIS — D6851 Activated protein C resistance: Secondary | ICD-10-CM

## 2022-11-11 DIAGNOSIS — I825Z2 Chronic embolism and thrombosis of unspecified deep veins of left distal lower extremity: Secondary | ICD-10-CM | POA: Diagnosis not present

## 2022-11-11 LAB — CBC WITH DIFFERENTIAL/PLATELET
Basophils Absolute: 0 10*3/uL (ref 0.0–0.1)
Basophils Relative: 0.7 % (ref 0.0–3.0)
Eosinophils Absolute: 0.1 10*3/uL (ref 0.0–0.7)
Eosinophils Relative: 2.3 % (ref 0.0–5.0)
HCT: 38.9 % — ABNORMAL LOW (ref 39.0–52.0)
Hemoglobin: 13.3 g/dL (ref 13.0–17.0)
Lymphocytes Relative: 44.9 % (ref 12.0–46.0)
Lymphs Abs: 2.4 10*3/uL (ref 0.7–4.0)
MCHC: 34.1 g/dL (ref 30.0–36.0)
MCV: 99.3 fl (ref 78.0–100.0)
Monocytes Absolute: 0.5 10*3/uL (ref 0.1–1.0)
Monocytes Relative: 8.5 % (ref 3.0–12.0)
Neutro Abs: 2.4 10*3/uL (ref 1.4–7.7)
Neutrophils Relative %: 43.6 % (ref 43.0–77.0)
Platelets: 123 10*3/uL — ABNORMAL LOW (ref 150.0–400.0)
RBC: 3.92 Mil/uL — ABNORMAL LOW (ref 4.22–5.81)
RDW: 14.2 % (ref 11.5–15.5)
WBC: 5.5 10*3/uL (ref 4.0–10.5)

## 2022-11-11 LAB — COMPREHENSIVE METABOLIC PANEL
ALT: 22 U/L (ref 0–53)
AST: 32 U/L (ref 0–37)
Albumin: 4 g/dL (ref 3.5–5.2)
Alkaline Phosphatase: 62 U/L (ref 39–117)
BUN: 16 mg/dL (ref 6–23)
CO2: 30 mEq/L (ref 19–32)
Calcium: 8.8 mg/dL (ref 8.4–10.5)
Chloride: 102 mEq/L (ref 96–112)
Creatinine, Ser: 0.9 mg/dL (ref 0.40–1.50)
GFR: 85.42 mL/min (ref >60.00)
Glucose, Bld: 87 mg/dL (ref 70–99)
Potassium: 4 mEq/L (ref 3.5–5.1)
Sodium: 142 mEq/L (ref 135–145)
Total Bilirubin: 1.1 mg/dL (ref 0.2–1.2)
Total Protein: 6.2 g/dL (ref 6.0–8.3)

## 2022-11-11 LAB — HEMOGLOBIN A1C: Hgb A1c MFr Bld: 6 % (ref 4.6–6.5)

## 2022-11-11 MED ORDER — SILDENAFIL CITRATE 100 MG PO TABS: 100.0000 mg | ORAL_TABLET | Freq: Every day | ORAL | 11 refills | Status: DC | PRN

## 2022-11-11 NOTE — Progress Notes (Signed)
Phone [phone] In person visit   Subjective:   William Park is a 72 y.o. year old very pleasant male patient who presents for/with See problem oriented charting Chief Complaint  Patient presents with   Medical Management of Chronic Issues    Pt not interested in anymore COVID vaccines.   Hypertension   Past Medical History-  Patient Active Problem List   Diagnosis Date Noted   Deep vein thrombosis (DVT) of distal vein of left lower extremity 07/10/2021    Priority: High   Pulmonary embolism 05/18/2021    Priority: High   CAD (coronary artery disease)     Priority: High   Hyperglycemia 05/18/2021    Priority: Medium    Alcohol use 05/18/2021    Priority: Medium    Spinal stenosis of lumbar region with neurogenic claudication 02/19/2021    Priority: Medium    Major depressive disorder with single episode, in full remission 05/12/2018    Priority: Medium    Hyperlipidemia     Priority: Medium    Hypertension     Priority: Medium    GERD (gastroesophageal reflux disease)     Priority: Medium    Osteoarthritis     Priority: Medium    Former smoker 01/06/2015    Priority: Low   Allergic rhinitis     Priority: Low   Factor V Leiden 11/11/2022   Overweight (BMI 25.0-29.9) 05/19/2021   Trochanteric bursitis of right hip 04/20/2021   Right leg pain 01/30/2021   Other chronic pain 01/30/2021   Peroneal tendinitis, right 05/18/2018   Anemia 08/13/2016    Medications- reviewed and updated Current Outpatient Medications  Medication Sig Dispense Refill   acetaminophen (TYLENOL) 500 MG tablet Take 1,000 mg by mouth every 6 (six) hours as needed for moderate pain.     amLODipine (NORVASC) 5 MG tablet Take 1 tablet (5 mg total) by mouth daily. 90 tablet 3   apixaban (ELIQUIS) 2.5 MG TABS tablet Take 1 tablet (2.5 mg total) by mouth 2 (two) times daily. 60 tablet 0   atorvastatin (LIPITOR) 80 MG tablet TAKE ONE TABLET BY MOUTH ONCE DAILY 90 tablet 3   busPIRone  (BUSPAR) 5 MG tablet Take 1 tablet (5 mg total) by mouth 2 (two) times daily as needed (anxiety). 180 tablet 3   escitalopram (LEXAPRO) 10 MG tablet TAKE ONE TABLET BY MOUTH ONCE DAILY 90 tablet 3   ezetimibe (ZETIA) 10 MG tablet TAKE ONE TABLET BY MOUTH ONCE DAILY 90 tablet 3   famotidine (PEPCID) 20 MG tablet TAKE ONE TABLET BY MOUTH TWICE DAILY 180 tablet 3   fluticasone (FLONASE) 50 MCG/ACT nasal spray instill TWO SPRAYS in each nostril ONCE daily 48 g 0   losartan (COZAAR) 50 MG tablet Take 1 tablet (50 mg total) by mouth 2 (two) times daily. 180 tablet 3   metFORMIN (GLUCOPHAGE) 500 MG tablet TAKE ONE TABLET BY MOUTH EVERY MORNING with breakfast 90 tablet 3   metoprolol tartrate (LOPRESSOR) 25 MG tablet TAKE 1/2 TABLET BY MOUTH TWICE DAILY (please cut in half FOR patient) 180 tablet 3   omeprazole (PRILOSEC) 40 MG capsule TAKE ONE CAPSULE BY MOUTH BEFORE BREAKFAST 90 capsule 3   benzonatate (TESSALON) 100 MG capsule Take 1 capsule (100 mg total) by mouth 3 (three) times daily as needed for cough (chronic cough). (Patient not taking: Reported on 11/11/2022) 30 capsule 4   sildenafil (VIAGRA) 100 MG tablet Take 1 tablet (100 mg total) by mouth daily as  needed for erectile dysfunction. 10 tablet 11   No current facility-administered medications for this visit.     Objective:  BP 122/70   Pulse (!) 53   Temp (!) 97.4 F (36.3 C)   Ht  (1.803 m)   Wt 198 lb 12.8 oz (90.2 kg)   SpO2 95%   BMI 27.73 kg/m  Gen: NAD, resting comfortably CV: bradycardic but regulra, no murmurs rubs or gallops Lungs: CTAB no crackles, wheeze, rhonchi Ext: no edema Skin: warm, dry    Assessment and Plan   #DVT/PE- due to factor V leiden heterozygote - sees Dr. Truett Perna S: Patient diagnosed with pulmonary embolism on 05/18/2021.  Also diagnosed with DVT.   -Patient compliant with Eliquis 2.5 mg twice daily-originally cost was a big concern- appears [facility] was able to work with him to get lower cost in  long run -diagnosed with Factor V leiden heterozygote A/P: stable- continue current medicines - appropriately anticoagulated with reduced dose  #CAD - sees Dr. Tresa Endo #hyperlipidemia S: Medication: Atorvastatin 80 mg, Zetia 10 mg -only on eliquis- not on aspirin -no chest pain or shortness of breath  Lab Results  Component Value Date   CHOL 123 05/13/2022   HDL 71.00 05/13/2022   LDLCALC 41 05/13/2022   LDLDIRECT 74.0 11/19/2021   TRIG 59.0 05/13/2022   CHOLHDL 2 05/13/2022  A/P: CAD asymptomatic continue current medications  Lipids at goal- not quite due for full repeat at this time- recheck next year   #hypertension S: medication: Amlodipine 5 mg , losartan 50 mg BID, Toprol 12.5 mg twice daily -Mild intermittent cough on losartan BP Readings from Last 3 Encounters:  11/11/22 122/70  08/19/22 119/68  07/03/22 114/64  Home readings #s: similar to what we got here today A/P: stable- continue current medicines    # Depression/anxiety S: Medication:lexapro 10 mg.  Buspirone 5 mg 3 times daily anxiety Therapy/counseling: Patient follows with a therapist in [place]  Thoughts of self-harm: none     11/11/2022    9:50 AM 09/02/2022    9:37 AM 05/13/2022    1:07 PM  Depression screen PHQ 2/9  Decreased Interest 0 0 0  Down, Depressed, Hopeless 0 0 0  PHQ - 2 Score 0 0 0  Altered sleeping 0  0  Tired, decreased energy 0  0  Change in appetite 0  0  Feeling bad or failure about yourself  0  0  Trouble concentrating 0  0  Moving slowly or fidgety/restless 0  0  Suicidal thoughts 0  0  PHQ-9 Score 0  0  Difficult doing work/chores Not difficult at all  Not difficult at all  A/P: full remission- continue current medications    # GERD S:Medication: Omeprazole 40 mg daily-also Pepcid up to twice daily A/P: stable- continue current medicines     # Hyperglycemia/insulin resistance/prediabetes- peak a1c 6.01 November 2021 S:  Medication: metformin 500 mg- upsets stoamach  at times but not too bad Lab Results  Component Value Date   HGBA1C 6.2 05/13/2022   HGBA1C 6.6 (H) 11/19/2021   A/P: a1c improved last visit- update with labs today  Recommended follow up: Return in about 7 months (around 06/13/2023) for followup or sooner if needed.Schedule b4 you leave. Future Appointments  Date Time Provider Department Center  04/21/2023 11:30 AM [facility] PHLEBOTOMIST [facility] None  04/21/2023 12:00 PM Ladene Artist, MD [facility] None  09/11/2023  9:15 AM [facility] ANNUAL WELLNESS VISIT 1 [facility]  Lab/Order associations:   ICD-10-CM   1. Hyperglycemia  R73.9 HgB A1c    2. Mixed hyperlipidemia  E78.2 CBC with Differential/Platelet    Comprehensive metabolic panel    3. Factor V Leiden  D68.51     4. Major depressive disorder with single episode, in full remission Chronic F32.5     5. Chronic deep vein thrombosis (DVT) of distal vein of left lower extremity  I82.5Z2     6. Chronic pulmonary embolism without acute cor pulmonale, unspecified pulmonary embolism type  I27.82      Meds ordered this encounter  Medications   sildenafil (VIAGRA) 100 MG tablet    Sig: Take 1 tablet (100 mg total) by mouth daily as needed for erectile dysfunction.    Dispense:  10 tablet    Refill:  11    Return precautions advised.  Tana Conch, MD

## 2022-11-11 NOTE — Patient Instructions (Addendum)
Please stop by lab before you go If you have mychart- we will send your results within 3 business days of [place] receiving them.  If you do not have mychart- we will call you about results within 5 business days of [place] receiving them.  *please also note that you will see labs on mychart as soon as they post. I will later go in and write notes on them- will say "notes from Dr. Durene Cal"   Glad you are doing so well   Recommended follow up: Return in about 7 months (around 06/13/2023) for followup or sooner if needed.Schedule b4 you leave.

## 2022-11-25 ENCOUNTER — Telehealth: Payer: Self-pay | Admitting: Pharmacist

## 2022-11-25 MED ORDER — BUSPIRONE HCL 5 MG PO TABS: 5.0000 mg | ORAL_TABLET | Freq: Two times a day (BID) | ORAL | 3 refills | Status: DC | PRN

## 2022-11-25 NOTE — Telephone Encounter (Signed)
-----   Message from April D Calhoun sent at 11/25/2022  2:22 PM EDT ----- Regarding: Rx refill request Patient has an upcoming delivery with [facility]. Patient will need refills on Buspirone 5 mg three times daily as needed to complete his order. Please send refills to [facility].  Thank you,  April D Calhoun, [facility] Clinical Pharmacist Assistant [phone]

## 2022-11-25 NOTE — Progress Notes (Signed)
Care Management & Coordination Services Pharmacy Team  Reason for Encounter: Medication coordination and delivery  Contacted patient to discuss medications and coordinate delivery from [facility]. Spoke with patient on 11/25/2022  Cycle dispensing form sent to Erskine Emery for review.   Last adherence delivery date: 11/05/2022      Patient is due for next adherence delivery on: 12/04/2022  This delivery to include: Vials  30 Days  Amlodipine 5 mg 1 tablet daily  Omeprazole 40 mg once a day Losartan Potassium 50 mg twice a day Famotidine 20 mg twice daily Buspirone 5 mg three times daily as needed Metoprolol Tartrate 12.5 mg twice daily - break in half Atorvastatin 80 mg once a day Escitalopram 10 mg once daily Ezetimibe 10 mg daily Metformin 500 mg every morning  Refills requested from providers include: Buspirone 5 mg three times daily as needed  Confirmed delivery date of 12/04/2022, advised patient that pharmacy will contact them the morning of delivery.   Any concerns about your medications? No  How often do you forget or accidentally miss a dose? Rarely  Do you use a pillbox? Yes  Is patient in packaging No  If yes  What is the date on your next pill pack?  Any concerns or issues with your packaging?   Chart review: Recent office visits:  None  Recent consult visits:  11/11/2022 [facility] (PCP) Shelva Majestic, MD; no medication changes indicated.  Hospital visits:  None in previous 6 months  Medications: Outpatient Encounter Medications as of 11/25/2022  Medication Sig   acetaminophen (TYLENOL) 500 MG tablet Take 1,000 mg by mouth every 6 (six) hours as needed for moderate pain.   amLODipine (NORVASC) 5 MG tablet Take 1 tablet (5 mg total) by mouth daily.   apixaban (ELIQUIS) 2.5 MG TABS tablet Take 1 tablet (2.5 mg total) by mouth 2 (two) times daily.   atorvastatin (LIPITOR) 80 MG tablet TAKE ONE TABLET BY MOUTH ONCE DAILY   benzonatate (TESSALON) 100 MG  capsule Take 1 capsule (100 mg total) by mouth 3 (three) times daily as needed for cough (chronic cough). (Patient not taking: Reported on 11/11/2022)   busPIRone (BUSPAR) 5 MG tablet Take 1 tablet (5 mg total) by mouth 2 (two) times daily as needed (anxiety).   escitalopram (LEXAPRO) 10 MG tablet TAKE ONE TABLET BY MOUTH ONCE DAILY   ezetimibe (ZETIA) 10 MG tablet TAKE ONE TABLET BY MOUTH ONCE DAILY   famotidine (PEPCID) 20 MG tablet TAKE ONE TABLET BY MOUTH TWICE DAILY   fluticasone (FLONASE) 50 MCG/ACT nasal spray instill TWO SPRAYS in each nostril ONCE daily   losartan (COZAAR) 50 MG tablet Take 1 tablet (50 mg total) by mouth 2 (two) times daily.   metFORMIN (GLUCOPHAGE) 500 MG tablet TAKE ONE TABLET BY MOUTH EVERY MORNING with breakfast   metoprolol tartrate (LOPRESSOR) 25 MG tablet TAKE 1/2 TABLET BY MOUTH TWICE DAILY (please cut in half FOR patient)   omeprazole (PRILOSEC) 40 MG capsule TAKE ONE CAPSULE BY MOUTH BEFORE BREAKFAST   sildenafil (VIAGRA) 100 MG tablet Take 1 tablet (100 mg total) by mouth daily as needed for erectile dysfunction.   No facility-administered encounter medications on file as of 11/25/2022.   BP Readings from Last 3 Encounters:  11/11/22 122/70  08/19/22 119/68  07/03/22 114/64    Pulse Readings from Last 3 Encounters:  11/11/22 (!) 53  08/19/22 60  07/03/22 (!) 456    Lab Results  Component Value Date/Time   HGBA1C  6.0 11/11/2022 10:35 AM   HGBA1C 6.2 05/13/2022 02:34 PM   Lab Results  Component Value Date   CREATININE 0.90 11/11/2022   BUN 16 11/11/2022   GFR 85.42 11/11/2022   GFRNONAA >60 12/17/2021   GFRAA 85 04/10/2020   NA 142 11/11/2022   K 4.0 11/11/2022   CALCIUM 8.8 11/11/2022   CO2 30 11/11/2022     Future Appointments  Date Time Provider Department Center  04/21/2023 11:30 AM [facility] PHLEBOTOMIST [facility] None  04/21/2023 12:00 PM Ladene Artist, MD [facility] None  06/16/2023  9:20 AM Shelva Majestic, MD [facility]   09/11/2023  9:15 AM [facility] [place] VISIT 1 [facility]   April D Calhoun, [facility] Clinical Pharmacist Assistant [phone]

## 2022-12-08 ENCOUNTER — Other Ambulatory Visit: Payer: Self-pay | Admitting: Oncology

## 2022-12-09 ENCOUNTER — Other Ambulatory Visit (HOSPITAL_BASED_OUTPATIENT_CLINIC_OR_DEPARTMENT_OTHER): Payer: Self-pay

## 2022-12-09 DIAGNOSIS — H903 Sensorineural hearing loss, bilateral: Secondary | ICD-10-CM | POA: Diagnosis not present

## 2022-12-09 DIAGNOSIS — H838X3 Other specified diseases of inner ear, bilateral: Secondary | ICD-10-CM | POA: Diagnosis not present

## 2022-12-09 DIAGNOSIS — H9313 Tinnitus, bilateral: Secondary | ICD-10-CM | POA: Diagnosis not present

## 2022-12-09 MED ORDER — APIXABAN 2.5 MG PO TABS
2.5000 mg | ORAL_TABLET | Freq: Two times a day (BID) | ORAL | 5 refills | Status: DC
  Filled 2022-12-09: qty 60, 30d supply, fill #0
  Filled 2023-01-12: qty 60, 30d supply, fill #1

## 2023-01-12 ENCOUNTER — Other Ambulatory Visit (HOSPITAL_BASED_OUTPATIENT_CLINIC_OR_DEPARTMENT_OTHER): Payer: Self-pay

## 2023-01-13 ENCOUNTER — Other Ambulatory Visit (HOSPITAL_BASED_OUTPATIENT_CLINIC_OR_DEPARTMENT_OTHER): Payer: Self-pay

## 2023-01-13 DIAGNOSIS — L298 Other pruritus: Secondary | ICD-10-CM | POA: Diagnosis not present

## 2023-01-13 DIAGNOSIS — L570 Actinic keratosis: Secondary | ICD-10-CM | POA: Diagnosis not present

## 2023-01-13 DIAGNOSIS — R238 Other skin changes: Secondary | ICD-10-CM | POA: Diagnosis not present

## 2023-01-13 DIAGNOSIS — L538 Other specified erythematous conditions: Secondary | ICD-10-CM | POA: Diagnosis not present

## 2023-01-13 DIAGNOSIS — L720 Epidermal cyst: Secondary | ICD-10-CM | POA: Diagnosis not present

## 2023-01-13 DIAGNOSIS — Z789 Other specified health status: Secondary | ICD-10-CM | POA: Diagnosis not present

## 2023-01-13 DIAGNOSIS — R208 Other disturbances of skin sensation: Secondary | ICD-10-CM | POA: Diagnosis not present

## 2023-01-13 DIAGNOSIS — B078 Other viral warts: Secondary | ICD-10-CM | POA: Diagnosis not present

## 2023-01-14 ENCOUNTER — Other Ambulatory Visit (HOSPITAL_BASED_OUTPATIENT_CLINIC_OR_DEPARTMENT_OTHER): Payer: Self-pay

## 2023-01-14 ENCOUNTER — Telehealth: Payer: Self-pay | Admitting: *Deleted

## 2023-01-14 ENCOUNTER — Encounter: Payer: Self-pay | Admitting: *Deleted

## 2023-01-14 DIAGNOSIS — L538 Other specified erythematous conditions: Secondary | ICD-10-CM | POA: Diagnosis not present

## 2023-01-14 DIAGNOSIS — L720 Epidermal cyst: Secondary | ICD-10-CM | POA: Diagnosis not present

## 2023-01-14 DIAGNOSIS — R208 Other disturbances of skin sensation: Secondary | ICD-10-CM | POA: Diagnosis not present

## 2023-01-14 DIAGNOSIS — L729 Follicular cyst of the skin and subcutaneous tissue, unspecified: Secondary | ICD-10-CM | POA: Diagnosis not present

## 2023-01-14 NOTE — Telephone Encounter (Signed)
[name] reports Eliquis is cost prohibitive for him. At request of Dr. Truett Perna, inquired if he agrees to trying warfarin. [name] agrees to try this. He was made aware of needing lab draw at least monthly and possibly more often to regulate dosage. He agrees to try and wants script sent to [facility].

## 2023-01-14 NOTE — Progress Notes (Signed)
Notified by pharmacy that patient refuses to fill his apixaban due to cost of $94.00. He has already used the copay card and was denied patient assistance. MD notified.

## 2023-01-15 ENCOUNTER — Telehealth: Payer: Self-pay | Admitting: Family Medicine

## 2023-01-15 ENCOUNTER — Other Ambulatory Visit: Payer: Self-pay | Admitting: *Deleted

## 2023-01-15 DIAGNOSIS — Z7901 Long term (current) use of anticoagulants: Secondary | ICD-10-CM

## 2023-01-15 MED ORDER — WARFARIN SODIUM 5 MG PO TABS: ORAL_TABLET | ORAL | 0 refills | Status: DC

## 2023-01-15 MED ORDER — APIXABAN 2.5 MG PO TABS: 2.5000 mg | ORAL_TABLET | Freq: Two times a day (BID) | ORAL | 0 refills | Status: DC

## 2023-01-15 MED ORDER — WARFARIN SODIUM 5 MG PO TABS
ORAL_TABLET | ORAL | 0 refills | Status: DC
Start: 2023-01-15 — End: 2023-01-27

## 2023-01-15 NOTE — Telephone Encounter (Signed)
Contacted pt and advised of coumadin clinic and educated concerning warfarin. Advised pt PCP will have to authorize transitioning from eliquis to warfarin. Pt reports he has enough Eliquis for tomorrow and then he will be out. Advised when transitioning he should remain on Eliquis, and take warfarin until his INR is 2.0 or >. Advised this nurse will f/u with PCP to inquire if any samples of Eliquis are available and if PCP is ok with the transition. Advised this nurse will f/u with him after discussing with PCP. Pt verbalized understanding.  Will start pt on 5mg  daily warfarin if PCP agrees.

## 2023-01-15 NOTE — Telephone Encounter (Signed)
Yes thanks-I am okay with him transitioning due to cost concerns

## 2023-01-15 NOTE — Telephone Encounter (Signed)
Per Dr. Truett Perna: Prefers PCP to dose/manage his warfarin since they have a coumadin clinic. Spoke w/Nancy at Dr. Erasmo Leventhal office to let them know he can't afford Eliquis and now agrees to try warfarin and Dr. Truett Perna wants Dr. Erasmo Leventhal team to manage. She will pass this on to provider. Notified William Park and he agrees to coumadin clinic. States he still has a few doses of the Eliquis on hand till he gets the warfarin started. Instructed him to call PCP tomorrow if he does not hear from them by then and he agrees.

## 2023-01-15 NOTE — Telephone Encounter (Signed)
Dr Midge Minium office called and states pt is no longer taking Eliquis because of cost and will be taking Warfarin instead, please manage meds. Pt agreed to take new meds yesterday and has not started new meds yet.  Any questions, please call [phone] Darl Pikes RN

## 2023-01-15 NOTE — Telephone Encounter (Signed)
No Eliquis samples at [facility] but inquired at [facility] location and they do have 2.5 mg Eliquis samples. They are setting them aside. Will contact pt after msg received from PCP with further instructions. Will pick up samples for pt this afternoon and see if pt can come to [place] today or go to [facility].

## 2023-01-15 NOTE — Telephone Encounter (Signed)
See below

## 2023-01-15 NOTE — Telephone Encounter (Addendum)
Contacted pt and advised samples of Eliquis are available. Pt will pick up samples at [facility] tomorrow morning. Pt requested warfarin prescription sent to [facility], [place], [place].  Advised to continue Eliquis as prescribed and start warfarin, 5 mg, daily in the evening.   Educated pt further on s/s of bleeding and abnormal bruising and if any s/s to go to the ER. Advised if any questions to contact coumadin clinic. Provided direct number to coumadin clinic. Pt verbalized understanding.  Scheduled coumadin clinic apt for one week at [facility].   Sent in warfarin prescription.

## 2023-01-21 NOTE — Patient Instructions (Signed)
Pre visit review using our clinic review tool, if applicable. No additional management support is needed unless otherwise documented below in the visit note.  Website for further information concerning warfarin and anticoagulation, https://rivera.[email or web address]  Continue taking Eliquis. Increase dose today to take 1 1/2 tablets and then change weekly dose to take 1 tablet daily except take 1 1/2 tablets on Mondays, Wednesdays and Fridays. Recheck on 7/1 at 7:15 at [place].

## 2023-01-22 ENCOUNTER — Ambulatory Visit (INDEPENDENT_AMBULATORY_CARE_PROVIDER_SITE_OTHER): Payer: Medicare Other

## 2023-01-22 DIAGNOSIS — Z7901 Long term (current) use of anticoagulants: Secondary | ICD-10-CM | POA: Diagnosis not present

## 2023-01-22 LAB — POCT INR: INR: 1.1 — AB (ref 2.0–3.0)

## 2023-01-22 NOTE — Progress Notes (Addendum)
Pt reported he did not start warfarin until 6/22 due to working 12 hour shifts, he could not pick up the script before then.   Continue taking Eliquis. Increase dose today to take 1 1/2 tablets and then change weekly dose to take 1 tablet daily except take 1 1/2 tablets on Mondays, Wednesdays and Fridays. Recheck on 7/1 at 7:15 at [place].   A full discussion of the nature of anticoagulants has been carried out.  A benefit risk analysis has been presented to the patient, so that they understand the justification for choosing anticoagulation at this time. The need for frequent and regular monitoring, precise dosage adjustment and compliance is stressed.  Side effects of potential bleeding are discussed.  The patient should avoid any OTC items containing aspirin or ibuprofen, and should avoid great swings in general diet.  Avoid alcohol consumption.  Call if any signs of abnormal bleeding.  Pt was provided written educational material, vitamin K book, [employer] new pt guide, brief list of OTC meds to use and not use and the anticoagulation ZONE tool.  Pt also provided with website, https://rivera.[email or web address] This will provide even further information.

## 2023-01-27 ENCOUNTER — Ambulatory Visit (INDEPENDENT_AMBULATORY_CARE_PROVIDER_SITE_OTHER): Payer: Medicare Other

## 2023-01-27 DIAGNOSIS — Z7901 Long term (current) use of anticoagulants: Secondary | ICD-10-CM

## 2023-01-27 LAB — POCT INR: INR: 3.1 — AB (ref 2.0–3.0)

## 2023-01-27 MED ORDER — WARFARIN SODIUM 5 MG PO TABS
ORAL_TABLET | ORAL | 0 refills | Status: DC
Start: 2023-01-27 — End: 2023-03-21

## 2023-01-27 NOTE — Patient Instructions (Addendum)
Pre visit review using our clinic review tool, if applicable. No additional management support is needed unless otherwise documented below in the visit note.  Stop taking Eliquis. Reduce dose today to take 1/2 tablet and the continue 1 1/2 tablets and then change weekly dose to take 1 tablet daily except take 1 1/2 tablets on Mondays, Wednesdays and Fridays. Recheck on 7/8 at [place].

## 2023-01-27 NOTE — Progress Notes (Signed)
Stop taking Eliquis. Reduce dose today to take 1/2 tablet and the continue 1 1/2 tablets and then change weekly dose to take 1 tablet daily except take 1 1/2 tablets on Mondays, Wednesdays and Fridays. Recheck on 7/8 at [place].   Pt is compliant with warfarin management and PCP apts.  Sent in refill of warfarin to requested pharmacy.

## 2023-02-03 ENCOUNTER — Ambulatory Visit (INDEPENDENT_AMBULATORY_CARE_PROVIDER_SITE_OTHER): Payer: Medicare Other

## 2023-02-03 DIAGNOSIS — Z7901 Long term (current) use of anticoagulants: Secondary | ICD-10-CM

## 2023-02-03 LAB — POCT INR: INR: 3.6 — AB (ref 2.0–3.0)

## 2023-02-03 NOTE — Patient Instructions (Addendum)
Pre visit review using our clinic review tool, if applicable. No additional management support is needed unless otherwise documented below in the visit note.  Hold dose today and then change weekly dose to take 1 tablet daily except take 1 1/2 tablets on Mondays and Fridays. Recheck on 7/15 at [place].

## 2023-02-03 NOTE — Progress Notes (Signed)
Hold dose today and then change weekly dose to take 1 tablet daily except take 1 1/2 tablets on Mondays and Fridays. Recheck on 7/15 at [place].

## 2023-02-10 ENCOUNTER — Ambulatory Visit: Payer: Medicare Other

## 2023-02-10 DIAGNOSIS — Z7901 Long term (current) use of anticoagulants: Secondary | ICD-10-CM

## 2023-02-10 LAB — POCT INR: INR: 3.4 — AB (ref 2.0–3.0)

## 2023-02-10 NOTE — Progress Notes (Signed)
Hold dose today and then change weekly dose to take 1 tablet daily. Recheck on 7/22 at [place].

## 2023-02-10 NOTE — Patient Instructions (Addendum)
Pre visit review using our clinic review tool, if applicable. No additional management support is needed unless otherwise documented below in the visit note.  Hold dose today and then change weekly dose to take 1 tablet daily. Recheck on 7/22 at [place].

## 2023-02-17 ENCOUNTER — Ambulatory Visit (INDEPENDENT_AMBULATORY_CARE_PROVIDER_SITE_OTHER): Payer: Medicare Other

## 2023-02-17 DIAGNOSIS — Z7901 Long term (current) use of anticoagulants: Secondary | ICD-10-CM

## 2023-02-17 LAB — POCT INR: INR: 3.2 — AB (ref 2.0–3.0)

## 2023-02-17 NOTE — Progress Notes (Signed)
Hold dose today and then change weekly dose to take 1 tablet daily except take 1/2 tablet on Wednesday. Recheck on 8/5 at [place].

## 2023-02-17 NOTE — Patient Instructions (Addendum)
Pre visit review using our clinic review tool, if applicable. No additional management support is needed unless otherwise documented below in the visit note.  Hold dose today and then change weekly dose to take 1 tablet daily except take 1/2 tablet on Wednesday. Recheck on 8/5 at [place].

## 2023-03-03 ENCOUNTER — Ambulatory Visit: Payer: Medicare Other

## 2023-03-05 ENCOUNTER — Ambulatory Visit (INDEPENDENT_AMBULATORY_CARE_PROVIDER_SITE_OTHER): Payer: Medicare Other

## 2023-03-05 DIAGNOSIS — Z7901 Long term (current) use of anticoagulants: Secondary | ICD-10-CM | POA: Diagnosis not present

## 2023-03-05 LAB — POCT INR: INR: 2.4 (ref 2.0–3.0)

## 2023-03-05 NOTE — Patient Instructions (Addendum)
Pre visit review using our clinic review tool, if applicable. No additional management support is needed unless otherwise documented below in the visit note.  Continue 1 tablet daily except take 1/2 tablet on Wednesday. Recheck on 8/28 at [place].

## 2023-03-05 NOTE — Progress Notes (Addendum)
Continue 1 tablet daily except take 1/2 tablet on Wednesday. Recheck on 8/28 at [place].

## 2023-03-21 ENCOUNTER — Telehealth: Payer: Self-pay

## 2023-03-21 DIAGNOSIS — Z7901 Long term (current) use of anticoagulants: Secondary | ICD-10-CM

## 2023-03-21 MED ORDER — WARFARIN SODIUM 5 MG PO TABS
ORAL_TABLET | ORAL | 1 refills | Status: DC
Start: 2023-03-21 — End: 2023-10-03

## 2023-03-21 NOTE — Telephone Encounter (Signed)
Pt is compliant with warfarin management and PCP apts.  Sent in refill of warfarin to requested pharmacy.      

## 2023-03-24 ENCOUNTER — Telehealth: Payer: Self-pay

## 2023-03-24 NOTE — Telephone Encounter (Signed)
Hi Shannonn, we got a refill request from pt pharmacy for his Warfarin.

## 2023-03-25 NOTE — Telephone Encounter (Signed)
Pt contacted office last week and requested refill. It was sent in last week.

## 2023-03-26 ENCOUNTER — Ambulatory Visit (INDEPENDENT_AMBULATORY_CARE_PROVIDER_SITE_OTHER): Payer: Medicare Other

## 2023-03-26 DIAGNOSIS — Z7901 Long term (current) use of anticoagulants: Secondary | ICD-10-CM | POA: Diagnosis not present

## 2023-03-26 LAB — POCT INR: INR: 1.9 — AB (ref 2.0–3.0)

## 2023-03-26 NOTE — Patient Instructions (Addendum)
Pre visit review using our clinic review tool, if applicable. No additional management support is needed unless otherwise documented below in the visit note.  Continue 1 tablet daily except take 1/2 tablet on Wednesday. Recheck in 4 weeks.

## 2023-03-26 NOTE — Progress Notes (Signed)
Continue 1 tablet daily except take 1/2 tablet on Wednesday. Recheck in 4 weeks.

## 2023-04-14 ENCOUNTER — Telehealth: Payer: Self-pay | Admitting: Family Medicine

## 2023-04-14 MED ORDER — FAMOTIDINE 20 MG PO TABS: 20.0000 mg | ORAL_TABLET | Freq: Two times a day (BID) | ORAL | 3 refills | Status: AC

## 2023-04-14 MED ORDER — OMEPRAZOLE 40 MG PO CPDR: 40.0000 mg | DELAYED_RELEASE_CAPSULE | Freq: Every day | ORAL | 3 refills | Status: DC

## 2023-04-14 MED ORDER — ATORVASTATIN CALCIUM 80 MG PO TABS: 80.0000 mg | ORAL_TABLET | Freq: Every day | ORAL | 3 refills | Status: DC

## 2023-04-14 MED ORDER — LOSARTAN POTASSIUM 50 MG PO TABS: 50.0000 mg | ORAL_TABLET | Freq: Two times a day (BID) | ORAL | 3 refills | Status: DC

## 2023-04-14 MED ORDER — BUSPIRONE HCL 5 MG PO TABS: 5.0000 mg | ORAL_TABLET | Freq: Two times a day (BID) | ORAL | 3 refills | Status: DC | PRN

## 2023-04-14 MED ORDER — EZETIMIBE 10 MG PO TABS: 10.0000 mg | ORAL_TABLET | Freq: Every day | ORAL | 3 refills | Status: DC

## 2023-04-14 MED ORDER — SILDENAFIL CITRATE 100 MG PO TABS: 100.0000 mg | ORAL_TABLET | Freq: Every day | ORAL | 11 refills | Status: DC | PRN

## 2023-04-14 MED ORDER — METOPROLOL TARTRATE 25 MG PO TABS: ORAL_TABLET | ORAL | 3 refills | Status: DC

## 2023-04-14 MED ORDER — METFORMIN HCL 500 MG PO TABS: 1000.0000 mg | ORAL_TABLET | Freq: Every day | ORAL | 3 refills | Status: DC

## 2023-04-14 MED ORDER — ESCITALOPRAM OXALATE 10 MG PO TABS: 10.0000 mg | ORAL_TABLET | Freq: Every day | ORAL | 3 refills | Status: DC

## 2023-04-14 MED ORDER — AMLODIPINE BESYLATE 5 MG PO TABS: 5.0000 mg | ORAL_TABLET | Freq: Every day | ORAL | 3 refills | Status: DC

## 2023-04-14 NOTE — Telephone Encounter (Signed)
Refills sent to requested pharmacy.

## 2023-04-14 NOTE — Telephone Encounter (Signed)
Prescription Request  04/14/2023  LOV: 11/11/2022  What is the name of the medication or equipment? [facility] closed and pt would like ALL MEDS to be send to the NEW pharmacy  Have you contacted your pharmacy to request a refill? Yes   Which pharmacy would you like this sent to?  [facility] [address] [place] - [address] [place] [address] Phone: [phone] Fax: [phone]    Patient notified that their request is being sent to the clinical staff for review and that they should receive a response within 2 business days.   Please advise at Mobile [phone] (mobile)

## 2023-04-21 ENCOUNTER — Other Ambulatory Visit: Payer: Medicare Other

## 2023-04-21 ENCOUNTER — Ambulatory Visit: Payer: Medicare Other | Admitting: Oncology

## 2023-04-22 ENCOUNTER — Telehealth: Payer: Self-pay | Admitting: Family Medicine

## 2023-04-22 NOTE — Telephone Encounter (Signed)
Pt is needing a letter stating he is well and stable to be sent to [facility] in order for him to be accepted for a policy. States they are concerned due to pt taking 3 BP meds. Please advise  Needs to be sent to Ilsa Iha, Case #[id number] Fax: [phone]

## 2023-04-22 NOTE — Telephone Encounter (Signed)
To whom it may concern,   Patient does have medical conditions but they are all appropriately managed and in stable condition. He has upcoming follow up as well in November and follows closely every 6 months to ensure stability.   Thank you, Tana Conch, MD  and patients primary care provider

## 2023-04-22 NOTE — Telephone Encounter (Signed)
See below

## 2023-04-23 ENCOUNTER — Ambulatory Visit: Payer: Medicare Other

## 2023-04-23 NOTE — Telephone Encounter (Signed)
Letter written and faxed to info below, lm on pt vm making him aware.

## 2023-04-30 ENCOUNTER — Ambulatory Visit (INDEPENDENT_AMBULATORY_CARE_PROVIDER_SITE_OTHER): Payer: Medicare Other

## 2023-04-30 DIAGNOSIS — Z7901 Long term (current) use of anticoagulants: Secondary | ICD-10-CM | POA: Diagnosis not present

## 2023-04-30 LAB — POCT INR: INR: 4.6 — AB (ref 2.0–3.0)

## 2023-04-30 NOTE — Progress Notes (Signed)
Pt reports he used to eat a salad everyday but has gotten tired of them and has not been eating any for the last few weeks. Educated pt on importance of keeping vitamin K containing foods consistent in diet.  Pt denies any bleeding or abnormal bruising. Advised if any bleeding go to ER. Pt verbalized understanding.  Hold dose today and tomorrow and then change weekly dose to take 1 tablet daily except take 1/2 tablet on Mondays, Wednesday, and Friday. Recheck in 3 weeks, per pt request due to lapse in insurance coverage.

## 2023-04-30 NOTE — Patient Instructions (Addendum)
Pre visit review using our clinic review tool, if applicable. No additional management support is needed unless otherwise documented below in the visit note.  Hold dose today and tomorrow and then change weekly dose to take 1 tablet daily except take 1/2 tablet on Mondays, Wednesday, and Friday. Recheck in 3 weeks.

## 2023-05-19 ENCOUNTER — Inpatient Hospital Stay: Payer: Medicare Other

## 2023-05-19 ENCOUNTER — Inpatient Hospital Stay: Payer: Medicare Other | Admitting: Oncology

## 2023-05-19 ENCOUNTER — Other Ambulatory Visit: Payer: Medicare Other

## 2023-05-19 ENCOUNTER — Ambulatory Visit: Payer: Medicare Other | Admitting: Oncology

## 2023-05-20 ENCOUNTER — Other Ambulatory Visit: Payer: Medicare Other

## 2023-05-20 ENCOUNTER — Ambulatory Visit: Payer: Medicare Other | Admitting: Oncology

## 2023-05-21 ENCOUNTER — Ambulatory Visit (INDEPENDENT_AMBULATORY_CARE_PROVIDER_SITE_OTHER): Payer: Medicare Other

## 2023-05-21 DIAGNOSIS — Z7901 Long term (current) use of anticoagulants: Secondary | ICD-10-CM

## 2023-05-21 LAB — POCT INR: INR: 2.5 (ref 2.0–3.0)

## 2023-05-21 NOTE — Patient Instructions (Addendum)
Pre visit review using our clinic review tool, if applicable. No additional management support is needed unless otherwise documented below in the visit note.  Continue 1 tablet daily except take 1/2 tablet on Mondays, Wednesday, and Friday. Recheck in 4 weeks.

## 2023-05-21 NOTE — Progress Notes (Signed)
Continue 1 tablet daily except take 1/2 tablet on Mondays, Wednesday, and Friday. Recheck in 4 weeks.

## 2023-06-16 ENCOUNTER — Ambulatory Visit (INDEPENDENT_AMBULATORY_CARE_PROVIDER_SITE_OTHER): Payer: Medicare Other

## 2023-06-16 ENCOUNTER — Encounter: Payer: Self-pay | Admitting: Family Medicine

## 2023-06-16 ENCOUNTER — Ambulatory Visit (INDEPENDENT_AMBULATORY_CARE_PROVIDER_SITE_OTHER): Payer: Medicare Other | Admitting: Family Medicine

## 2023-06-16 VITALS — BP 122/68 | HR 55 | Temp 98.3°F | Ht 71.0 in | Wt 204.4 lb

## 2023-06-16 DIAGNOSIS — I825Z2 Chronic embolism and thrombosis of unspecified deep veins of left distal lower extremity: Secondary | ICD-10-CM

## 2023-06-16 DIAGNOSIS — E782 Mixed hyperlipidemia: Secondary | ICD-10-CM | POA: Diagnosis not present

## 2023-06-16 DIAGNOSIS — Z23 Encounter for immunization: Secondary | ICD-10-CM

## 2023-06-16 DIAGNOSIS — I10 Essential (primary) hypertension: Secondary | ICD-10-CM | POA: Diagnosis not present

## 2023-06-16 DIAGNOSIS — R739 Hyperglycemia, unspecified: Secondary | ICD-10-CM | POA: Diagnosis not present

## 2023-06-16 DIAGNOSIS — I2782 Chronic pulmonary embolism: Secondary | ICD-10-CM

## 2023-06-16 DIAGNOSIS — Z7901 Long term (current) use of anticoagulants: Secondary | ICD-10-CM

## 2023-06-16 DIAGNOSIS — Z87891 Personal history of nicotine dependence: Secondary | ICD-10-CM

## 2023-06-16 DIAGNOSIS — D6851 Activated protein C resistance: Secondary | ICD-10-CM

## 2023-06-16 DIAGNOSIS — Z79899 Other long term (current) drug therapy: Secondary | ICD-10-CM

## 2023-06-16 DIAGNOSIS — Z131 Encounter for screening for diabetes mellitus: Secondary | ICD-10-CM | POA: Diagnosis not present

## 2023-06-16 DIAGNOSIS — E538 Deficiency of other specified B group vitamins: Secondary | ICD-10-CM | POA: Insufficient documentation

## 2023-06-16 LAB — URINALYSIS, ROUTINE W REFLEX MICROSCOPIC
Bilirubin Urine: NEGATIVE
Hgb urine dipstick: NEGATIVE
Ketones, ur: NEGATIVE
Nitrite: NEGATIVE
RBC / HPF: NONE SEEN (ref 0–?)
Specific Gravity, Urine: 1.015 (ref 1.000–1.030)
Total Protein, Urine: NEGATIVE
Urine Glucose: NEGATIVE
Urobilinogen, UA: 2 — AB (ref 0.0–1.0)
pH: 7 (ref 5.0–8.0)

## 2023-06-16 LAB — COMPREHENSIVE METABOLIC PANEL
ALT: 30 U/L (ref 0–53)
AST: 46 U/L — ABNORMAL HIGH (ref 0–37)
Albumin: 3.9 g/dL (ref 3.5–5.2)
Alkaline Phosphatase: 82 U/L (ref 39–117)
BUN: 12 mg/dL (ref 6–23)
CO2: 32 meq/L (ref 19–32)
Calcium: 8.8 mg/dL (ref 8.4–10.5)
Chloride: 102 meq/L (ref 96–112)
Creatinine, Ser: 0.95 mg/dL (ref 0.40–1.50)
GFR: 79.73 mL/min (ref >60.00)
Glucose, Bld: 95 mg/dL (ref 70–99)
Potassium: 3.6 meq/L (ref 3.5–5.1)
Sodium: 143 meq/L (ref 135–145)
Total Bilirubin: 0.8 mg/dL (ref 0.2–1.2)
Total Protein: 6.4 g/dL (ref 6.0–8.3)

## 2023-06-16 LAB — CBC WITH DIFFERENTIAL/PLATELET
Basophils Absolute: 0.1 10*3/uL (ref 0.0–0.1)
Basophils Relative: 1.1 % (ref 0.0–3.0)
Eosinophils Absolute: 0.1 10*3/uL (ref 0.0–0.7)
Eosinophils Relative: 2.1 % (ref 0.0–5.0)
HCT: 38.3 % — ABNORMAL LOW (ref 39.0–52.0)
Hemoglobin: 12.9 g/dL — ABNORMAL LOW (ref 13.0–17.0)
Lymphocytes Relative: 48.2 % — ABNORMAL HIGH (ref 12.0–46.0)
Lymphs Abs: 2.5 10*3/uL (ref 0.7–4.0)
MCHC: 33.8 g/dL (ref 30.0–36.0)
MCV: 102.3 fL — ABNORMAL HIGH (ref 78.0–100.0)
Monocytes Absolute: 0.4 10*3/uL (ref 0.1–1.0)
Monocytes Relative: 7.7 % (ref 3.0–12.0)
Neutro Abs: 2.1 10*3/uL (ref 1.4–7.7)
Neutrophils Relative %: 40.9 % — ABNORMAL LOW (ref 43.0–77.0)
Platelets: 112 10*3/uL — ABNORMAL LOW (ref 150.0–400.0)
RBC: 3.74 Mil/uL — ABNORMAL LOW (ref 4.22–5.81)
RDW: 14.5 % (ref 11.5–15.5)
WBC: 5.2 10*3/uL (ref 4.0–10.5)

## 2023-06-16 LAB — HEMOGLOBIN A1C: Hgb A1c MFr Bld: 6.1 % (ref 4.6–6.5)

## 2023-06-16 LAB — LIPID PANEL
Cholesterol: 136 mg/dL (ref 0–200)
HDL: 72.2 mg/dL (ref >39.00)
LDL Cholesterol: 47 mg/dL (ref 0–99)
NonHDL: 63.88
Total CHOL/HDL Ratio: 2
Triglycerides: 83 mg/dL (ref 0.0–149.0)
VLDL: 16.6 mg/dL (ref 0.0–40.0)

## 2023-06-16 LAB — POCT INR: INR: 3.2 — AB (ref 2.0–3.0)

## 2023-06-16 LAB — VITAMIN B12: Vitamin B-12: 184 pg/mL — ABNORMAL LOW (ref 211–911)

## 2023-06-16 MED ORDER — FLUTICASONE PROPIONATE 50 MCG/ACT NA SUSP: 2.0000 | Freq: Every day | NASAL | 3 refills | Status: AC

## 2023-06-16 MED ORDER — VALSARTAN 320 MG PO TABS: 320.0000 mg | ORAL_TABLET | Freq: Every day | ORAL | 3 refills | Status: DC

## 2023-06-16 NOTE — Progress Notes (Signed)
Pt also had apt with PCP today. Hold dose today and then continue 1 tablet daily except take 1/2 tablet on Mondays, Wednesday, and Friday. Recheck in 3 weeks.

## 2023-06-16 NOTE — Patient Instructions (Addendum)
Please stop by lab before you go If you have mychart- we will send your results within 3 business days of [place] receiving them.  If you do not have mychart- we will call you about results within 5 business days of [place] receiving them.  *please also note that you will see labs on mychart as soon as they post. I will later go in and write notes on them- will say "notes from Dr. Durene Cal"   So sorry for your loss- let us know if you need anything  Recommended follow up: Return in about 6 months (around 12/14/2023) for followup or sooner if needed.Schedule b4 you leave.

## 2023-06-16 NOTE — Patient Instructions (Addendum)
Pre visit review using our clinic review tool, if applicable. No additional management support is needed unless otherwise documented below in the visit note.  Hold dose today and then continue 1 tablet daily except take 1/2 tablet on Mondays, Wednesday, and Friday. Recheck in 3 weeks.

## 2023-06-16 NOTE — Progress Notes (Addendum)
Phone [phone] In person visit   Subjective:   William Park is a 72 y.o. year old very pleasant male patient who presents for/with See problem oriented charting Chief Complaint  Patient presents with   Medical Management of Chronic Issues    Past Medical History-  Patient Active Problem List   Diagnosis Date Noted   Factor V Leiden (HCC) 11/11/2022    Priority: High   Deep vein thrombosis (DVT) of distal vein of left lower extremity (HCC) 07/10/2021    Priority: High   Pulmonary embolism (HCC) 05/18/2021    Priority: High   CAD (coronary artery disease)     Priority: High   Hyperglycemia 05/18/2021    Priority: Medium    Alcohol use 05/18/2021    Priority: Medium    Spinal stenosis of lumbar region with neurogenic claudication 02/19/2021    Priority: Medium    Major depressive disorder with single episode, in full remission (HCC) 05/12/2018    Priority: Medium    Hyperlipidemia     Priority: Medium    Hypertension     Priority: Medium    GERD (gastroesophageal reflux disease)     Priority: Medium    Osteoarthritis     Priority: Medium    Former smoker 01/06/2015    Priority: Low   Allergic rhinitis     Priority: Low   Overweight (BMI 25.0-29.9) 05/19/2021   Trochanteric bursitis of right hip 04/20/2021   Right leg pain 01/30/2021   Other chronic pain 01/30/2021   Peroneal tendinitis, right 05/18/2018   Anemia 08/13/2016    Medications- reviewed and updated Current Outpatient Medications  Medication Sig Dispense Refill   acetaminophen (TYLENOL) 500 MG tablet Take 1,000 mg by mouth every 6 (six) hours as needed for moderate pain.     amLODipine (NORVASC) 5 MG tablet Take 1 tablet (5 mg total) by mouth daily. 90 tablet 3   atorvastatin (LIPITOR) 80 MG tablet Take 1 tablet (80 mg total) by mouth daily. 90 tablet 3   busPIRone (BUSPAR) 5 MG tablet Take 1 tablet (5 mg total) by mouth 2 (two) times daily as needed (anxiety). 180 tablet 3   escitalopram  (LEXAPRO) 10 MG tablet Take 1 tablet (10 mg total) by mouth daily. 90 tablet 3   ezetimibe (ZETIA) 10 MG tablet Take 1 tablet (10 mg total) by mouth daily. 90 tablet 3   famotidine (PEPCID) 20 MG tablet Take 1 tablet (20 mg total) by mouth 2 (two) times daily. 180 tablet 3   metFORMIN (GLUCOPHAGE) 500 MG tablet Take 2 tablets (1,000 mg total) by mouth daily with breakfast. 90 tablet 3   metoprolol tartrate (LOPRESSOR) 25 MG tablet TAKE 1/2 TABLET BY MOUTH TWICE DAILY (please cut in half FOR patient) 180 tablet 3   omeprazole (PRILOSEC) 40 MG capsule Take 1 capsule (40 mg total) by mouth daily. 90 capsule 3   sildenafil (VIAGRA) 100 MG tablet Take 1 tablet (100 mg total) by mouth daily as needed for erectile dysfunction. 10 tablet 11   valsartan (DIOVAN) 320 MG tablet Take 1 tablet (320 mg total) by mouth daily. 90 tablet 3   warfarin (COUMADIN) 5 MG tablet TAKE 1 TABLET BY MOUTH DAILY EXCEPT TAKE 1/2 TABLETS ON WEDNESDAY OR AS DIRECTED BY ANTICOAGULATION CLINIC 105 tablet 1   fluticasone (FLONASE) 50 MCG/ACT nasal spray Place 2 sprays into both nostrils daily. 48 g 3   No current facility-administered medications for this visit.     Objective:  BP 122/68   Pulse (!) 55   Temp 98.3 F (36.8 C)   Ht 5\' 11"  (1.803 m)   Wt 204 lb 6.4 oz (92.7 kg)   SpO2 94%   BMI 28.51 kg/m  Gen: NAD, resting comfortably Oropharynx normal- nares normal CV: RRR no murmurs rubs or gallops Lungs: CTAB no crackles, wheeze, rhonchi Abdomen: soft/nontender/nondistended/normal bowel sounds. No rebound or guarding.  Ext: trace edema Skin: warm, dry     Assessment and Plan   #social update- lost his daughter 2 weeks ago - related to drugs  #DVT/PE- due to factor V leiden heterozygote - sees Dr. Truett Perna S: Patient diagnosed with pulmonary embolism on 05/18/2021.  Also diagnosed with DVT.   -Patient compliant with Coumadin through  Coumadin clinic-Eliquis was too costly -diagnosed with Factor V  leiden heterozygote -No recent chest pain, shortness of breath, leg swelling A/P: Chronic DVT/PE with factor V Leiden heterozygote-appropriately treated with Coumadin-continue current medication -sees hematology next week  #CAD - sees Dr. Tresa Endo #hyperlipidemia S: Medication: Atorvastatin 80 mg, Zetia 10 mg-may not require Zetia -only on Coumadin- not on aspirin  -No chest pain or shortness of breath reported  Lab Results  Component Value Date   CHOL 123 05/13/2022   HDL 71.00 05/13/2022   LDLCALC 41 05/13/2022   LDLDIRECT 74.0 11/19/2021   TRIG 59.0 05/13/2022   CHOLHDL 2 05/13/2022  A/P: CAD asymptomatic-continue current medication  Lipids Well-controlled last year-update lipid panel today-continue current medication for now   #hypertension S: medication: Amlodipine 5 mg , losartan 50 mg BID, Toprol 12.5 mg twice daily -Mild intermittent cough on losartan   -120/60 at home BP Readings from Last 3 Encounters:  06/16/23 122/68  11/11/22 122/70  08/19/22 119/68   A/P: ongoing cough on losartan- possible contributing factor- stop this and start valsartan 320 mg- he can cut in half and take twice a day if desired. Blood pressure Controlled. Continue current medications otherwise with amlodipine 5 mg and metoprolol 12.5 mg twice daily -blood pressure goal at home at least <135/85 on average  # Depression/anxiety S: Medication:lexapro 10 mg.  Buspirone 5 mg 3 times daily anxiety Therapy/counseling: none, prior in 2022-2023 therapist in [place]- none at the moment   Thoughts of self-harm: none     06/16/2023    9:23 AM 11/11/2022    9:50 AM 09/02/2022    9:37 AM  Depression screen PHQ 2/9  Decreased Interest 1 0 0  Down, Depressed, Hopeless 0 0 0  PHQ - 2 Score 1 0 0  Altered sleeping 0 0   Tired, decreased energy 0 0   Change in appetite 0 0   Feeling bad or failure about yourself  1 0   Trouble concentrating 0 0   Moving slowly or fidgety/restless 0 0   Suicidal  thoughts 0 0   PHQ-9 Score 2 0   Difficult doing work/chores Not difficult at all Not difficult at all   A/P: Well-controlled-continue current medication  # GERD S:Medication: Omeprazole 40 mg daily-also Pepcid up to twice daily A/P: stable- continue current medicines      # Hyperglycemia/insulin resistance/prediabetes- peak a1c 6.01 November 2021 S:  Medication: metformin 500 mg. Some walking- also helps with dealing with loss- weight up slightly - encouraged mild weight loss Lab Results  Component Value Date   HGBA1C 6.0 11/11/2022   HGBA1C 6.2 05/13/2022   HGBA1C 6.6 (H) 11/19/2021   A/P: Hopefully stable or improved- update A1c with labs today.  Continue current meds for now.  # Allergies-needs refill on Flonase-provided today-typically helpful -also stop losartan to see if this helps his cough -CXR 05/13/22 reassuring and was having cough then- also CT angio chest 08/18/21 with no malignancy  Recommended follow up: No follow-ups on file. Future Appointments  Date Time Provider Department Center  06/16/2023 10:00 AM [facility] COUMADIN [facility]  06/23/2023 11:45 AM [facility] PHLEBOTOMIST [facility] None  06/23/2023 12:00 PM Ladene Artist, MD [facility] None  07/03/2023  3:20 PM Lennette Bihari, MD [facility] None  09/11/2023  9:15 AM [facility] ANNUAL WELLNESS VISIT 1 [facility]  12/15/2023  1:20 PM [facility] None    Lab/Order associations:   ICD-10-CM   1. Chronic deep vein thrombosis (DVT) of distal vein of left lower extremity (HCC)  I82.5Z2     2. Chronic pulmonary embolism without acute cor pulmonale, unspecified pulmonary embolism type (HCC)  I27.82     3. Factor V Leiden (HCC)  D68.51     4. Primary hypertension  I10 Comprehensive metabolic panel    CBC with Differential/Platelet    Lipid panel    5. High risk medication use  Z79.899 Vitamin B12    6. Mixed hyperlipidemia  E78.2 Comprehensive metabolic panel    CBC with Differential/Platelet     Lipid panel    7. Hyperglycemia  R73.9 Hemoglobin A1c    8. Screening for diabetes mellitus  Z13.1 Hemoglobin A1c    9. Need for influenza vaccination  Z23 Flu Vaccine Trivalent High Dose (Fluad)      Meds ordered this encounter  Medications   fluticasone (FLONASE) 50 MCG/ACT nasal spray    Sig: Place 2 sprays into both nostrils daily.    Dispense:  48 g    Refill:  3   valsartan (DIOVAN) 320 MG tablet    Sig: Take 1 tablet (320 mg total) by mouth daily.    Dispense:  90 tablet    Refill:  3    Return precautions advised.  Tana Conch, MD

## 2023-06-16 NOTE — Assessment & Plan Note (Signed)
S: medication: Amlodipine 5 mg , losartan 50 mg BID, Toprol 12.5 mg twice daily -Mild intermittent cough on losartan BP Readings from Last 3 Encounters:  06/16/23 122/68  11/11/22 122/70  08/19/22 119/68   A/P: ongoing cough on losartan- possible contributing factor- stop this and start valsartan 320 mg- he can cut in half and take twice a day if desired. Blood pressure Controlled. Continue current medications otherwise with amlodipine 5 mg and metoprolol 12.5 mg twice daily

## 2023-06-18 ENCOUNTER — Telehealth: Payer: Self-pay | Admitting: Family Medicine

## 2023-06-18 NOTE — Telephone Encounter (Signed)
Pt states Keba called and let him know he can either come to office for B12 shot or we can send RX for his to do himself. He would like the RX to be sent to the pharmacy on chart. Please advise.

## 2023-06-19 MED ORDER — CYANOCOBALAMIN 1000 MCG/ML IJ SOLN: INTRAMUSCULAR | 15 refills | Status: DC

## 2023-06-19 NOTE — Telephone Encounter (Signed)
Rx sent to pharmacy for pt.

## 2023-06-23 ENCOUNTER — Ambulatory Visit (INDEPENDENT_AMBULATORY_CARE_PROVIDER_SITE_OTHER): Payer: Medicare Other

## 2023-06-23 ENCOUNTER — Other Ambulatory Visit: Payer: Self-pay

## 2023-06-23 ENCOUNTER — Inpatient Hospital Stay (HOSPITAL_BASED_OUTPATIENT_CLINIC_OR_DEPARTMENT_OTHER): Payer: Medicare Other | Admitting: Oncology

## 2023-06-23 ENCOUNTER — Inpatient Hospital Stay: Payer: Medicare Other | Attending: Oncology

## 2023-06-23 VITALS — BP 125/63 | HR 60 | Temp 98.2°F | Resp 18 | Ht 71.0 in | Wt 206.5 lb

## 2023-06-23 DIAGNOSIS — Z79899 Other long term (current) drug therapy: Secondary | ICD-10-CM | POA: Diagnosis not present

## 2023-06-23 DIAGNOSIS — Z86718 Personal history of other venous thrombosis and embolism: Secondary | ICD-10-CM | POA: Insufficient documentation

## 2023-06-23 DIAGNOSIS — I2510 Atherosclerotic heart disease of native coronary artery without angina pectoris: Secondary | ICD-10-CM | POA: Diagnosis not present

## 2023-06-23 DIAGNOSIS — R634 Abnormal weight loss: Secondary | ICD-10-CM | POA: Insufficient documentation

## 2023-06-23 DIAGNOSIS — I10 Essential (primary) hypertension: Secondary | ICD-10-CM | POA: Diagnosis not present

## 2023-06-23 DIAGNOSIS — E538 Deficiency of other specified B group vitamins: Secondary | ICD-10-CM | POA: Diagnosis not present

## 2023-06-23 DIAGNOSIS — F32A Depression, unspecified: Secondary | ICD-10-CM | POA: Insufficient documentation

## 2023-06-23 DIAGNOSIS — D696 Thrombocytopenia, unspecified: Secondary | ICD-10-CM

## 2023-06-23 DIAGNOSIS — D123 Benign neoplasm of transverse colon: Secondary | ICD-10-CM | POA: Diagnosis not present

## 2023-06-23 DIAGNOSIS — E119 Type 2 diabetes mellitus without complications: Secondary | ICD-10-CM | POA: Diagnosis not present

## 2023-06-23 DIAGNOSIS — K648 Other hemorrhoids: Secondary | ICD-10-CM | POA: Insufficient documentation

## 2023-06-23 DIAGNOSIS — D6851 Activated protein C resistance: Secondary | ICD-10-CM | POA: Diagnosis not present

## 2023-06-23 DIAGNOSIS — I252 Old myocardial infarction: Secondary | ICD-10-CM | POA: Insufficient documentation

## 2023-06-23 DIAGNOSIS — I2699 Other pulmonary embolism without acute cor pulmonale: Secondary | ICD-10-CM

## 2023-06-23 LAB — CBC WITH DIFFERENTIAL (CANCER CENTER ONLY)
Abs Immature Granulocytes: 0.02 10*3/uL (ref 0.00–0.07)
Abs Immature Granulocytes: 0.02 10*3/uL (ref 0.00–0.07)
Basophils Absolute: 0 10*3/uL (ref 0.0–0.1)
Basophils Absolute: 0 10*3/uL (ref 0.0–0.1)
Basophils Relative: 1 %
Basophils Relative: 1 %
Eosinophils Absolute: 0.1 10*3/uL (ref 0.0–0.5)
Eosinophils Absolute: 0.1 10*3/uL (ref 0.0–0.5)
Eosinophils Relative: 2 %
Eosinophils Relative: 2 %
HCT: 36.2 % — ABNORMAL LOW (ref 39.0–52.0)
HCT: 36.2 % — ABNORMAL LOW (ref 39.0–52.0)
Hemoglobin: 12.4 g/dL — ABNORMAL LOW (ref 13.0–17.0)
Hemoglobin: 12.5 g/dL — ABNORMAL LOW (ref 13.0–17.0)
Immature Granulocytes: 0 %
Immature Granulocytes: 0 %
Lymphocytes Relative: 49 %
Lymphocytes Relative: 50 %
Lymphs Abs: 2.4 10*3/uL (ref 0.7–4.0)
Lymphs Abs: 2.5 10*3/uL (ref 0.7–4.0)
MCH: 33.7 pg (ref 26.0–34.0)
MCH: 34.2 pg — ABNORMAL HIGH (ref 26.0–34.0)
MCHC: 34.3 g/dL (ref 30.0–36.0)
MCHC: 34.5 g/dL (ref 30.0–36.0)
MCV: 98.4 fL (ref 80.0–100.0)
MCV: 98.9 fL (ref 80.0–100.0)
Monocytes Absolute: 0.5 10*3/uL (ref 0.1–1.0)
Monocytes Absolute: 0.6 10*3/uL (ref 0.1–1.0)
Monocytes Relative: 10 %
Monocytes Relative: 11 %
Neutro Abs: 1.9 10*3/uL (ref 1.7–7.7)
Neutro Abs: 1.8 10*3/uL (ref 1.7–7.7)
Neutrophils Relative %: 38 %
Neutrophils Relative %: 36 %
Platelet Count: 112 10*3/uL — ABNORMAL LOW (ref 150–400)
Platelet Count: 125 10*3/uL — ABNORMAL LOW (ref 150–400)
RBC: 3.68 MIL/uL — ABNORMAL LOW (ref 4.22–5.81)
RBC: 3.66 MIL/uL — ABNORMAL LOW (ref 4.22–5.81)
RDW: 14 % (ref 11.5–15.5)
RDW: 14 % (ref 11.5–15.5)
WBC Count: 5 10*3/uL (ref 4.0–10.5)
WBC Count: 5 10*3/uL (ref 4.0–10.5)
nRBC: 0 % (ref 0.0–0.2)
nRBC: 0 % (ref 0.0–0.2)

## 2023-06-23 LAB — SAVE SMEAR(SSMR), FOR PROVIDER SLIDE REVIEW

## 2023-06-23 MED ORDER — CYANOCOBALAMIN 1000 MCG/ML IJ SOLN
1000.0000 ug | Freq: Once | INTRAMUSCULAR | Status: AC
  Administered 2023-06-23: 1000 ug via INTRAMUSCULAR

## 2023-06-23 NOTE — Progress Notes (Signed)
Patient is in office today for a nurse visit for B12 Injection. Patient Injection was given in the  Right deltoid. Patient tolerated injection well.

## 2023-06-23 NOTE — Progress Notes (Addendum)
  [facility] OFFICE PROGRESS NOTE   Diagnosis: DVT /PE  INTERVAL HISTORY:   [name] returns as scheduled.  He changed to Coumadin anticoagulation in June.  No symptom of recurrent venous thrombosis.  He bleeds easily with trauma.  No other bleeding. [name] continues alcohol use.  He reports 3-4 liquor drinks per day.  Objective:  Vital signs in last 24 hours:  Blood pressure 125/63, pulse 60, temperature 98.2 F (36.8 C), temperature source Temporal, resp. rate 18, height 5\' 11"  (1.803 m), weight 206 lb 8 oz (93.7 kg), SpO2 98%.    HEENT: Oropharynx without visible mass Lymphatics: No cervical, supraclavicular, axillary, or inguinal nodes.  Soft mobile 1 cm subcutaneous nodular lesion medial to the right submandibular gland Resp: Lungs clear bilaterally Cardio: Regular rate and rhythm GI: No hepatosplenomegaly Vascular: Trace left greater than right lower leg edema  Lab Results:  Lab Results  Component Value Date   WBC 5.0 06/23/2023   HGB 12.4 (L) 06/23/2023   HCT 36.2 (L) 06/23/2023   MCV 98.4 06/23/2023   PLT 112 (L) 06/23/2023   NEUTROABS 1.9 06/23/2023  Blood smear: Few target cells, thick smear limits Red cell morphology evaluation.  The white cell morphology is unremarkable.  There is a relative increase in mature appearing lymphocytes.  No blasts or other young forms are seen.  The platelets appear mildly decreased.  There are several 2-3 platelet clumps.  No large platelet clumps.  CMP  Lab Results  Component Value Date   NA 143 06/16/2023   K 3.6 06/16/2023   CL 102 06/16/2023   CO2 32 06/16/2023   GLUCOSE 95 06/16/2023   BUN 12 06/16/2023   CREATININE 0.95 06/16/2023   CALCIUM 8.8 06/16/2023   PROT 6.4 06/16/2023   ALBUMIN 3.9 06/16/2023   AST 46 (H) 06/16/2023   ALT 30 06/16/2023   ALKPHOS 82 06/16/2023   BILITOT 0.8 06/16/2023   GFRNONAA >60 12/17/2021   GFRAA 85 04/10/2020    No results found for: "CEA1", "CEA", "DUK025",  "CA125"  Lab Results  Component Value Date   INR 3.2 (A) 06/16/2023   LABPROT 13.0 05/18/2021    Imaging:  No results found.  Medications: I have reviewed the patient's current medications.   Assessment/Plan: Acute PE, left lower extremity DVT October 2022 Factor V Leiden heterozygote Weight loss  Recent diagnosis diabetes 02/19/2021 right L4-5 laminectomy/foraminotomies CAD, history of MI Hypertension Depression Alcohol use History of mild thrombocytopenia CT abdomen/pelvis 12/20/2021-subtle wall thickening of the cecal tip at the appendiceal orifice Colonoscopy 07/06/2019-diverticuli, internal hemorrhoids Colonoscopy 02/25/2022-polyps removed from the transverse colon-tubular adenomas 11.  Chronic mild thrombocytopenia 12.  Vitamin B12 deficiency November 2024     Disposition: [name] appears stable.  He is maintained on chronic Coumadin anticoagulation.  The Coumadin is managed by Dr. Durene Cal.  He has chronic mild thrombocytopenia, likely secondary to alcohol use.  He has mild anemia.  He may have underlying liver disease.  I suspect the mild anemia and thrombocytopenia related to chronic alcohol use and potentially B12 deficiency.  He was recently diagnosed with vitamin B12 deficiency and will be starting vitamin B12 replacement.  He will return for an office visit and CBC in 6 months.  He will call for spontaneous bleeding or bruising.  I encouraged him to decrease alcohol use.  Thornton Papas, MD  06/23/2023  12:18 PM

## 2023-07-03 ENCOUNTER — Encounter: Payer: Self-pay | Admitting: Cardiovascular Disease

## 2023-07-03 ENCOUNTER — Ambulatory Visit: Payer: Medicare Other | Attending: Cardiovascular Disease | Admitting: Cardiovascular Disease

## 2023-07-03 VITALS — BP 142/92 | HR 65 | Ht 71.0 in | Wt 209.0 lb

## 2023-07-03 DIAGNOSIS — Z9861 Coronary angioplasty status: Secondary | ICD-10-CM

## 2023-07-03 DIAGNOSIS — R931 Abnormal findings on diagnostic imaging of heart and coronary circulation: Secondary | ICD-10-CM

## 2023-07-03 DIAGNOSIS — Z7901 Long term (current) use of anticoagulants: Secondary | ICD-10-CM | POA: Diagnosis not present

## 2023-07-03 DIAGNOSIS — E118 Type 2 diabetes mellitus with unspecified complications: Secondary | ICD-10-CM

## 2023-07-03 DIAGNOSIS — I10 Essential (primary) hypertension: Secondary | ICD-10-CM | POA: Diagnosis not present

## 2023-07-03 DIAGNOSIS — R600 Localized edema: Secondary | ICD-10-CM

## 2023-07-03 DIAGNOSIS — E785 Hyperlipidemia, unspecified: Secondary | ICD-10-CM

## 2023-07-03 DIAGNOSIS — Z86711 Personal history of pulmonary embolism: Secondary | ICD-10-CM

## 2023-07-03 DIAGNOSIS — D6851 Activated protein C resistance: Secondary | ICD-10-CM

## 2023-07-03 DIAGNOSIS — I25118 Atherosclerotic heart disease of native coronary artery with other forms of angina pectoris: Secondary | ICD-10-CM | POA: Diagnosis not present

## 2023-07-03 MED ORDER — FUROSEMIDE 20 MG PO TABS: 20.0000 mg | ORAL_TABLET | Freq: Every day | ORAL | 3 refills | Status: DC

## 2023-07-03 NOTE — Patient Instructions (Addendum)
Medication Instructions:  Begin Furosemide 20mg  daily. This medication has been sent to your pharmacy *If you need a refill on your cardiac medications before your next appointment, please call your pharmacy*   Lab Work: No labs If you have labs (blood work) drawn today and your tests are completely normal, you will receive your results only by: MyChart Message (if you have MyChart) OR A paper copy in the mail If you have any lab test that is abnormal or we need to change your treatment, we will call you to review the results.  Follow-Up: At [facility], you and your health needs are our priority.  As part of our continuing mission to provide you with exceptional heart care, we have created designated Provider Care Teams.  These Care Teams include your primary Cardiologist (physician) and Advanced Practice Providers (APPs -  Physician Assistants and Nurse Practitioners) who all work together to provide you with the care you need, when you need it.  We recommend signing up for the patient portal called "MyChart".  Sign up information is provided on this After Visit Summary.  MyChart is used to connect with patients for Virtual Visits (Telemedicine).  Patients are able to view lab/test results, encounter notes, upcoming appointments, etc.  Non-urgent messages can be sent to your provider as well.   To learn more about what you can do with MyChart, go to [email or web address].    Your next appointment:   4-37month(s)  Provider:   Edd Fabian, FNP. Then, Bryan Lemma, MD will plan to see you again in 1 year(s).

## 2023-07-03 NOTE — Progress Notes (Signed)
Cardiology Office Note    Date:  07/05/2023   ID:  Hani Bladow, DOB 1951/03/23, MRN [id number]  PCP:  Shelva Majestic, MD  Cardiologist:  Nicki Guadalajara, MD    1 year F/U  cardiology evaluation initially referred through the courtesy of Dr. Tana Conch for establishment of cardiology care.  History of Present Illness:  William Park is a 72 y.o. male who has a history of known CAD, and apparently had undergone remote PTCA by me in 1990 currently had 3 blockages.  He underwent a subsequent PTCA in 1991 and his last catheterization was in 1994.  Subsequently, his insurance changed, and he has been evaluated by Dr. Earna Coder in [place] for his cardiology care.  States his last treadmill test was approximately 2 years ago and he was told that this was "okay."He has had some difficulty with family members with a daughter being a drug addict and having served time in prison and is getting back in prison and another daughter with issues.  He has been followed by Dr. Tana Conch for his primary care.  He had undergone a lower extremity arterial duplex study in July 2022 due to leg swelling and there was no evidence for DVT.  After not having seen him in over 25 years, I saw him for my initial evaluation on May 01, 2021 when he re-presented to establish cardiology care with me again in [place] of [place],  [place].  Over the last 10 months, he has started to notice slight progressive development of exertional shortness of breath with less activity.  He denies any chest pressure.  He is unaware of any arrhythmias.  He denies PND or orthopnea.  He has had some back and right leg discomfort.  Has a history of depression and is on risperidone.  He has been on losartan 50 mg twice a day for hypertension, famotidine for GERD in addition at times to omeprazole.  He is on simvastatin 40 mg for hyperlipidemia and apparently has a prescription for sildenafil as needed for  erectile dysfunction.  During my initial evaluation, his blood pressure was elevated insistent with stage II hypertension.  His losartan dose had recently been increased to 50 mg twice a day and at that time I recommended the addition of amlodipine 5 mg to his medical regimen.  With his exertional dyspnea I scheduled him for 2D echo Doppler study.  Since his prior evaluations were PTCA and not stents to further evaluate his coronary obstructive disease I scheduled him for coronary CTA evaluation.  Also recommend laboratory checked including a CMP, CBC, TSH lipid studies and LP(a).  He was in the hospital from October 21 through May 20, 2021.  He had undergone a CT angio of his chest which had been ordered by me on May 18, 2021 which  demonstrated probable acute pulmonary embolism in the right pulmonary arterial branches and middle lobe thrombus.  As result he was hospitalized with PE and was found to have a left DVT on lower extremity venous Doppler study.  He was started on Eliquis 5 mg twice a day.  Of note, his coronary calcium score on his coronary CTA was elevated at 1198 representing 87 percentile for age and sex with moderate CAD in the LAD, first diagonal and circumflex vessels.  FFR was normal in the RCA and circumflex vessel.  In the LAD proximal was 0.96, mid 0.93, and distal was positive at 0.77.  I saw him on May 31, 2021 following his hospitalization at which time his shortness of breath has improved. He was on amlodipine 5 mg, losartan 50 mg, and metoprolol tartrate 12.5 mg twice a day for his hypertension ann Eliquis 5 mg twice a day for anticoagulation.  He is on BuSpar.  During that evaluation, it was recommended that he have a 25-month follow-up CT angio of his chest.  His blood pressure remains slightly elevated and I further titrated amlodipine to 7.5 mg and he continued to be on losartan 50 mg daily and metoprolol tartrate 25 mg twice a day.  His lipid panel showed an LDL of 82  despite being on atorvastatin 80 mg and I recommended addition of Zetia 10 mg.  He underwent a follow-up CT angio of his chest on August 28, 2021.  This now showed interval resolution of previously seen pulmonary embolus within the right middle and right lower lobes.  Previously seen small pulmonary nodules or airspace opacity of the anterior right lower lobe abutting the major fissure was also resolved.  There was evidence for coronary artery and aortic atherosclerosis.  I saw him on September 18, 2021 at which time he felt well and denied chest pain.  At times it was some mild swelling in his left greater than right lower extremity.  At that time he told me that he had had back surgery approximately 3 to 4 months prior to his pulmonary embolism.  His blood pressure was stable on amlodipine 1.5 mg, losartan 50 mg, and metoprolol tartrate 12.5 twice a day.  With the calcium score of 1198 I recommended aggressive lipid-lowering therapy and he continues to be on atorvastatin 80 mg with Zetia 10 mg with target LDL in the 60s or below.  At time he continued to be on Eliquis 5 mg twice a day.  William Park sees Dr. Roberts Gaudy him on March 18, 2022.  He also is factor V Leiden heterozygote.  It was recommended that he continue long-term anticoagulation and at that time he was recommended that he can reduce his Eliquis dose to 2.5 mg twice a day.  I last saw him on July 03, 2022 at which time he continued to feel well.  He denied  chest pain or shortness of breath.  Apparently, he self discontinued Eliquis 1 month ago due to cost.  He is currently now on amlodipine 5 mg, losartan 50 mg daily and metoprolol tartrate 12.5 mg twice a day for blood pressure control.  He is on atorvastatin 80 mg and Zetia 10 mg for hyperlipidemia.  LDL cholesterol in October 2023 was 41.  He is diabetic on metformin.  He takes omeprazole for GERD.  During that evaluation, I discussed potential risk of recurrent DVT/PE particularly  with his genetics.  Initially provided him with samples of Eliquis 2.5 mg twice a day.    Presently, William Park feels well and is without chest pain or shortness of breath.  He is back on anticoagulation but is now on warfarin for anticoagulation instead of Eliquis due to cost.  He continues to be on amlodipine 5 mg, metoprolol 12.5 mg twice a day, and valsartan 320 mg.  He is diabetic on metformin.  He is on omeprazole for GERD.  Unfortunately, he lost the daughter at age 2 as a result of fentanyl.  He is on BuSpar for anxiety.  He has noticed ankle swelling left greater than right.  He continues to see Dr. Truett Perna for hematologic  follow-up and Dr. Tana Conch for primary care.  He presents for yearly evaluation.  Past Medical History:  Diagnosis Date   Allergic rhinitis    allegra and flonase   CAD (coronary artery disease)    Dr. Tresa Endo s/p 4 angioplasty's with last in 46. ASA. simvastatin 80mg    Diabetes mellitus without complication (HCC)    DVT (deep venous thrombosis) (HCC) 04/28/2021   GERD (gastroesophageal reflux disease)    nexium 40mg  and zantac BID-mout fills with saliva and coughing. Seeing GI in [place] 12/2014   Hyperlipidemia    simvastatin 80mg , niaspan 750mg    Hypertension    losartan 50mg    Myocardial infarction (HCC)    "small MI, mild"- 1994   Osteoarthritis    mainly hands. Mobic 15mg     Past Surgical History:  Procedure Laterality Date   ANGIOPLASTY     x4   COLONOSCOPY     EYE SURGERY     LUMBAR LAMINECTOMY/DECOMPRESSION MICRODISCECTOMY Right 02/19/2021   Procedure: L4-L5 LAMINOTOMY/FORAMINOTOMY;  Surgeon: Tressie Stalker, MD;  Location: [facility] OR;  Service: Neurosurgery;  Laterality: Right;  3C   pyloric stenosis     as a child   TONSILLECTOMY     UPPER GASTROINTESTINAL ENDOSCOPY      Current Medications: Outpatient Medications Prior to Visit  Medication Sig Dispense Refill   acetaminophen (TYLENOL) 500 MG tablet Take 1,000 mg by mouth every 6 (six)  hours as needed for moderate pain.     amLODipine (NORVASC) 5 MG tablet Take 1 tablet (5 mg total) by mouth daily. 90 tablet 3   atorvastatin (LIPITOR) 80 MG tablet Take 1 tablet (80 mg total) by mouth daily. 90 tablet 3   busPIRone (BUSPAR) 5 MG tablet Take 1 tablet (5 mg total) by mouth 2 (two) times daily as needed (anxiety). 180 tablet 3   cyanocobalamin (VITAMIN B12) 1000 MCG/ML injection 1000 mcg (1 mg) injection once per week for four weeks, followed by 1000 mcg injection once per month. 1 mL 15   escitalopram (LEXAPRO) 10 MG tablet Take 1 tablet (10 mg total) by mouth daily. 90 tablet 3   ezetimibe (ZETIA) 10 MG tablet Take 1 tablet (10 mg total) by mouth daily. 90 tablet 3   famotidine (PEPCID) 20 MG tablet Take 1 tablet (20 mg total) by mouth 2 (two) times daily. 180 tablet 3   fluticasone (FLONASE) 50 MCG/ACT nasal spray Place 2 sprays into both nostrils daily. 48 g 3   metFORMIN (GLUCOPHAGE) 500 MG tablet Take 2 tablets (1,000 mg total) by mouth daily with breakfast. (Patient taking differently: Take 500 mg by mouth daily with breakfast.) 90 tablet 3   metoprolol tartrate (LOPRESSOR) 25 MG tablet TAKE 1/2 TABLET BY MOUTH TWICE DAILY (please cut in half FOR patient) 180 tablet 3   omeprazole (PRILOSEC) 40 MG capsule Take 1 capsule (40 mg total) by mouth daily. 90 capsule 3   sildenafil (VIAGRA) 100 MG tablet Take 1 tablet (100 mg total) by mouth daily as needed for erectile dysfunction. 10 tablet 11   valsartan (DIOVAN) 320 MG tablet Take 1 tablet (320 mg total) by mouth daily. 90 tablet 3   warfarin (COUMADIN) 5 MG tablet TAKE 1 TABLET BY MOUTH DAILY EXCEPT TAKE 1/2 TABLETS ON WEDNESDAY OR AS DIRECTED BY ANTICOAGULATION CLINIC 105 tablet 1   No facility-administered medications prior to visit.     Allergies:   Patient has no known allergies.   Social History   Socioeconomic History   Marital  status: Divorced    Spouse name: Not on file   Number of children: Not on file   Years  of education: Not on file   Highest education level: 12th grade  Occupational History   Not on file  Tobacco Use   Smoking status: Former    Current packs/day: 0.00    Average packs/day: 2.0 packs/day for 25.0 years (50.0 ttl pk-yrs)    Types: Cigarettes    Start date: 07/29/1962    Quit date: 07/30/1987    Years since quitting: 35.9   Smokeless tobacco: Never  Vaping Use   Vaping status: Never Used  Substance and Sexual Activity   Alcohol use: Yes   Drug use: No   Sexual activity: Not on file  Other Topics Concern   Not on file  Social History Narrative   Family: Divorced. 1 living daughter (1 passed in 2024). 3 granddaughters.       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   Social Determinants of Corporate investment banker Strain: Low Risk  (06/13/2023)   Overall Financial Resource Strain (CARDIA)    Difficulty of Paying Living Expenses: Not hard at all  Food Insecurity: No Food Insecurity (06/13/2023)   Hunger Vital Sign    Worried About Running Out of Food in the Last Year: Never true    Ran Out of Food in the Last Year: Never true  Transportation Needs: No Transportation Needs (06/13/2023)   PRAPARE - Administrator, Civil Service (Medical): No    Lack of Transportation (Non-Medical): No  Physical Activity: Unknown (06/13/2023)   Exercise Vital Sign    Days of Exercise per Week: 0 days    Minutes of Exercise per Session: Patient declined  Stress: No Stress Concern Present (06/13/2023)   [employer] of Occupational Health - Occupational Stress Questionnaire    Feeling of Stress : Only a little  Social Connections: Moderately Integrated (06/13/2023)   Social Connection and Isolation Panel [NHANES]    Frequency of Communication with Friends and Family: More than three times a week    Frequency of Social Gatherings with Friends and Family: More than three times a week    Attends  Religious Services: More than 4 times per year    Active Member of [employer] or Organizations: Yes    Attends Engineer, structural: More than 4 times per year    Marital Status: Divorced     Socially he is divorced and lives alone.  He has 2 children.  He previously worked as a as a Naval architect.  He denies tobacco use.  He does drink occasional alcohol.  He does not exercise.  Unfortunately, his 4 year old daughter recently died as result of fentanyl use.  Family History:  The patient's family history includes AAA (abdominal aortic aneurysm) in his father; Breast cancer in his mother; CAD in his brother and father; Congestive Heart Failure in his mother; Dementia in his father; Depression in his daughter; Drug abuse in his daughter; Healthy in his daughter; Kidney disease in his father.   His mother died at age 30 with cancer.  Father died at 68 and had heart disease and kidney problems.  He has a brother age 60 with heart disease and another brother age 64.  ROS General: Negative; No fevers, chills, or night sweats;  HEENT: Occasional tinnitus,  No changes in vision or hearing,  sinus congestion, difficulty swallowing Pulmonary: Negative; No cough, wheezing, shortness of breath, hemoptysis Cardiovascular: Negative; No chest pain, presyncope, syncope, palpitations GI: Positive for GERD GU: Negative; No dysuria, hematuria, or difficulty voiding Musculoskeletal: Positive for back discomfort, status postlaminectomy and foramina ectomy July 2022. Hematologic/Oncology: Factor V Leiden heterozygous Endocrine: Diabetes mellitus Neuro: Negative; no changes in balance, headaches Skin: Negative; No rashes or skin lesions Psychiatric: Positive for depression Sleep: Negative; No snoring, daytime sleepiness, hypersomnolence, bruxism, restless legs, hypnogognic hallucinations, no cataplexy Other comprehensive 14 point system review is negative.   PHYSICAL EXAM:   VS:  BP (!) 142/92   Pulse 65    Ht 5\' 11"  (1.803 m)   Wt 209 lb (94.8 kg)   SpO2 94%   BMI 29.15 kg/m     Repeat blood pressure by me was elevated at 144/84  Wt Readings from Last 3 Encounters:  07/03/23 209 lb (94.8 kg)  06/23/23 206 lb 8 oz (93.7 kg)  06/16/23 204 lb 6.4 oz (92.7 kg)    General: Alert, oriented, no distress.  Skin: normal turgor, no rashes, warm and dry HEENT: Normocephalic, atraumatic. Pupils equal round and reactive to light; sclera anicteric; extraocular muscles intact;  Nose without nasal septal hypertrophy Mouth/Parynx benign; Mallinpatti scale 3 Neck: No JVD, no carotid bruits; normal carotid upstroke Lungs: clear to ausculatation and percussion; no wheezing or rales Chest wall: without tenderness to palpitation Heart: PMI not displaced, RRR, s1 s2 normal, 1/6 systolic murmur, no diastolic murmur, no rubs, gallops, thrills, or heaves Abdomen: soft, nontender; no hepatosplenomehaly, BS+; abdominal aorta nontender and not dilated by palpation. Back: no CVA tenderness Pulses 2+ Musculoskeletal: full range of motion, normal strength, no joint deformities Extremities: Bilateral ankle swelling 1+ left greater than right with left leg edema extending to the pretibial region; no clubbing cyanosis, Homan's sign negative  Neurologic: grossly nonfocal; Cranial nerves grossly wnl Psychologic: Normal mood and affect    Studies/Labs Reviewed:   EKG Interpretation Date/Time:  Thursday July 03 2023 14:15:49 EST Ventricular Rate:  65 PR Interval:  150 QRS Duration:  94 QT Interval:  426 QTC Calculation: 443 R Axis:   11  Text Interpretation: Normal sinus rhythm Nonspecific T wave abnormality When compared with ECG of 18-May-2021 13:15, PREVIOUS ECG IS PRESENT Confirmed by Nicki Guadalajara ([id number]) on 07/03/2023 2:37:02 PM    July 03, 2022 ECG (independently read by me): Sinus bradycardia 56 bpm.  PAC.  Normal intervals.  Nonspecific T wave abnormality  February 21,2023 ECG (independently  read by me): Sinus bradycardia at 56, baseline wander  May 31, 2021 ECG (independently read by me):  Sinus bradycardia at 53, normal intervals  May 01, 2021 ECG (independently read by me):  NSR at 82, nonspecific T changes, QTc 486 msec  Recent Labs:    Latest Ref Rng & Units 06/16/2023   10:34 AM 11/11/2022   10:35 AM 05/13/2022    2:34 PM  BMP  Glucose 70 - 99 mg/dL 95  87  604   BUN 6 - 23 mg/dL 12  16  13    Creatinine 0.40 - 1.50 mg/dL 5.40  9.81  1.91   Sodium 135 - 145 mEq/L 143  142  140   Potassium 3.5 - 5.1 mEq/L 3.6  4.0  3.8   Chloride 96 - 112 mEq/L 102  102  102   CO2 19 - 32 mEq/L 32  30  32   Calcium 8.4 - 10.5 mg/dL 8.8  8.8  9.1  Latest Ref Rng & Units 06/16/2023   10:34 AM 11/11/2022   10:35 AM 05/13/2022    2:34 PM  Hepatic Function  Total Protein 6.0 - 8.3 g/dL 6.4  6.2  6.5   Albumin 3.5 - 5.2 g/dL 3.9  4.0  4.0   AST 0 - 37 U/L 46  32  26   ALT 0 - 53 U/L 30  22  18    Alk Phosphatase 39 - 117 U/L 82  62  62   Total Bilirubin 0.2 - 1.2 mg/dL 0.8  1.1  1.1        Latest Ref Rng & Units 06/23/2023    1:20 PM 06/23/2023   11:34 AM 06/16/2023   10:34 AM  CBC  WBC 4.0 - 10.5 K/uL 5.0  5.0  5.2   Hemoglobin 13.0 - 17.0 g/dL 62.1  30.8  65.7   Hematocrit 39.0 - 52.0 % 36.2  36.2  38.3   Platelets 150 - 400 K/uL 125  112  112.0    Lab Results  Component Value Date   MCV 98.9 06/23/2023   MCV 98.4 06/23/2023   MCV 102.3 (H) 06/16/2023   Lab Results  Component Value Date   TSH 0.78 11/19/2021   Lab Results  Component Value Date   HGBA1C 6.1 06/16/2023     BNP No results found for: "BNP"  ProBNP No results found for: "PROBNP"   Lipid Panel     Component Value Date/Time   CHOL 136 06/16/2023 1034   CHOL 184 05/01/2021 1207   TRIG 83.0 06/16/2023 1034   HDL 72.20 06/16/2023 1034   HDL 82 05/01/2021 1207   CHOLHDL 2 06/16/2023 1034   VLDL 16.6 06/16/2023 1034   LDLCALC 47 06/16/2023 1034   LDLCALC 82 05/01/2021 1207    LDLCALC 62 04/10/2020 1516   LDLDIRECT 74.0 11/19/2021 1507   LABVLDL 20 05/01/2021 1207     RADIOLOGY: No results found.   Additional studies/ records that were reviewed today include:  I reviewed the records of Dr. Tana Conch.  I did not have records available from his prior angioplasties between 1990 and 1994.  ECHO: 05/19/2021 IMPRESSIONS   1. Left ventricular ejection fraction, by estimation, is 60 to 65%. The  left ventricle has normal function. The left ventricle has no regional  wall motion abnormalities. Left ventricular diastolic parameters are  indeterminate.   2. Right ventricular systolic function is normal. The right ventricular  size is normal. There is normal pulmonary artery systolic pressure.   3. Left atrial size was mildly dilated.   4. Right atrial size was mildly dilated.   5. The mitral valve is normal in structure. Trivial mitral valve  regurgitation. No evidence of mitral stenosis.   6. The aortic valve is grossly normal. Aortic valve regurgitation is not  visualized. No aortic stenosis is present.   7. The inferior vena cava is normal in size with greater than 50%  respiratory variability, suggesting right atrial pressure of 3 mmHg.   Comparison(s): No prior Echocardiogram.   Conclusion(s)/Recommendation(s): Normal biventricular function without  evidence of hemodynamically significant valvular heart disease. No  evidence of RV enlargement, normal RV function.   ASSESSMENT:    1. Primary hypertension   2. History of PTCAs: 1990 - 1994   3. Coronary artery disease of native artery of native heart with stable angina pectoris (HCC)   4. Elevated coronary artery calcium score: 1198 Agatston units -October 2022   5. History of  pulmonary embolus (PE)/DVT:   6. Factor V Leiden (HCC)   7. Hyperlipidemia with target LDL less than 70   8. Lower extremity edema   9. Warfarin anticoagulation   10. Type 2 diabetes mellitus with complication, without  long-term current use of insulin [facility])     PLAN:  Mr.  William Park is a 72 year-old gentleman who has a history of prior CAD and had 3 blockages in 1990 for which he PTCA procedures.  His last catheterization was in 1994.  Subsequently, he had lost his insurance and had subsequent follow-up cardiology evaluations by Dr. Earna Coder in [place].  He  underwent routine treadmill testing and was told that this was apparently "okay."  He reestablished cardiology care with me in early October 2022.  Prior to that evaluation over several months he began to notice a change with development of exertional shortness of breath with less activity.  He has had issues with back discomfort and had undergone laminectomy and also has had some right leg discomfort.  He has had recent increase in blood pressure requiring titration of medication.  At my initial re-evaluation on May 01, 2021 medications were titrated and amlodipine was started at 5 mg.  I scheduled him to undergo a coronary CTA and on this study he was found to have elevated calcium score 1198 representing 87th percentile for age and sex.  Incidentally, however he was found to have right lower lobe pulmonary arterial filling defect consistent with PE on this nondedicated exam.  Pulmonary embolism was confirmed on a CT angio of his chest and he also was noted to have new middle lobe thrombus in addition to his right pulmonary branches top normal RV to LV ratio suggesting possible right heart strain.  Has been maintained on Eliquis anticoagulation.  Subsequent CT showed resolution of his right middle and right lower lobe pulmonary embolism and resolution of previously seen small pulmonary nodules or airspace opacity of the anterior right lobe abutting the major fissure.  He has subsequently been found to be heterozygous for factor V deficiency. When he last saw Dr. Alcide Evener his dose of Eliquis was reduced to 2.5 mg twice a day in August 2023.  At my  evaluation in December 2023 he had self discontinued Eliquis due to cost.  Anticoagulation was reinstituted and he is now on warfarin due to cost for his continued anticoagulation.  Today, his blood pressure is elevated.  He also has ankle swelling bilaterally 1+ with more swelling extending to the pretibial region in the left leg.  I am adding furosemide 20 mg which will be helpful both for additional blood pressure control as well as edema.  Depending upon response this potentially can be changed to every other day.  He will continue with his current regimen of amlodipine 5 mg, metoprolol tartrate 12.5 mg twice a day and valsartan 320 mg daily.  Unfortunately his daughter died as a result of fentanyl use.  Most recent laboratory on June 16, 2023 showed total cholesterol 136, HDL 72, LDL 47 and triglycerides 83.  Hemoglobin A1c was 6.1.  He is on metformin 2000 mg daily.  He continues to be followed by Dr. Myrle Sheng and Dr. Durene Cal.  I have recommend that he return in 4 to 5 months to see Edd Fabian, NP and in light of my retirement next year and then will transition to see Dr. Herbie Baltimore in November 2025.      Medication Adjustments/Labs and Tests Ordered: Current medicines are  reviewed at length with the patient today.  Concerns regarding medicines are outlined above.  Medication changes, Labs and Tests ordered today are listed in the Patient Instructions below. Patient Instructions  Medication Instructions:  Begin Furosemide 20mg  daily. This medication has been sent to your pharmacy *If you need a refill on your cardiac medications before your next appointment, please call your pharmacy*   Lab Work: No labs If you have labs (blood work) drawn today and your tests are completely normal, you will receive your results only by: MyChart Message (if you have MyChart) OR A paper copy in the mail If you have any lab test that is abnormal or we need to change your treatment, we will call you to review  the results.  Follow-Up: At [facility], you and your health needs are our priority.  As part of our continuing mission to provide you with exceptional heart care, we have created designated Provider Care Teams.  These Care Teams include your primary Cardiologist (physician) and Advanced Practice Providers (APPs -  Physician Assistants and Nurse Practitioners) who all work together to provide you with the care you need, when you need it.  We recommend signing up for the patient portal called "MyChart".  Sign up information is provided on this After Visit Summary.  MyChart is used to connect with patients for Virtual Visits (Telemedicine).  Patients are able to view lab/test results, encounter notes, upcoming appointments, etc.  Non-urgent messages can be sent to your provider as well.   To learn more about what you can do with MyChart, go to [email or web address].    Your next appointment:   4-19month(s)  Provider:   Edd Fabian, FNP. Then, Bryan Lemma, MD will plan to see you again in 1 year(s).   Signed, Nicki Guadalajara, MD  07/05/2023 11:26 AM    [facility] [address], [place], [place]  [address] Phone: [phone]

## 2023-07-05 ENCOUNTER — Encounter: Payer: Self-pay | Admitting: Cardiovascular Disease

## 2023-07-07 ENCOUNTER — Ambulatory Visit (INDEPENDENT_AMBULATORY_CARE_PROVIDER_SITE_OTHER): Payer: Medicare Other

## 2023-07-07 DIAGNOSIS — Z7901 Long term (current) use of anticoagulants: Secondary | ICD-10-CM

## 2023-07-07 LAB — POCT INR: INR: 1.8 — AB (ref 2.0–3.0)

## 2023-07-07 NOTE — Progress Notes (Signed)
Increase dose today to take 1 tablet and then continue 1 tablet daily except take 1/2 tablet on Mondays, Wednesday, and Friday. Recheck in 2 weeks.

## 2023-07-07 NOTE — Patient Instructions (Addendum)
Pre visit review using our clinic review tool, if applicable. No additional management support is needed unless otherwise documented below in the visit note.  Increase dose today to take 1 tablet and then continue 1 tablet daily except take 1/2 tablet on Mondays, Wednesday, and Friday. Recheck in 2 weeks.

## 2023-07-21 ENCOUNTER — Ambulatory Visit (INDEPENDENT_AMBULATORY_CARE_PROVIDER_SITE_OTHER): Payer: Medicare Other

## 2023-07-21 DIAGNOSIS — Z7901 Long term (current) use of anticoagulants: Secondary | ICD-10-CM

## 2023-07-21 LAB — POCT INR: INR: 1.9 — AB (ref 2.0–3.0)

## 2023-07-21 NOTE — Progress Notes (Signed)
Increase dose today to take 1 tablet and then change weekly dose to take 1 tablet daily except take 1/2 tablet on Mondays and Friday. Recheck in 3 weeks.

## 2023-07-21 NOTE — Patient Instructions (Addendum)
Pre visit review using our clinic review tool, if applicable. No additional management support is needed unless otherwise documented below in the visit note.  Increase dose today to take 1 tablet and then change weekly dose to take 1 tablet daily except take 1/2 tablet on Mondays and Friday. Recheck in 3 weeks.

## 2023-07-24 ENCOUNTER — Encounter: Payer: Self-pay | Admitting: Family Medicine

## 2023-08-04 DIAGNOSIS — Z1283 Encounter for screening for malignant neoplasm of skin: Secondary | ICD-10-CM | POA: Diagnosis not present

## 2023-08-04 DIAGNOSIS — D2261 Melanocytic nevi of right upper limb, including shoulder: Secondary | ICD-10-CM | POA: Diagnosis not present

## 2023-08-04 DIAGNOSIS — L570 Actinic keratosis: Secondary | ICD-10-CM | POA: Diagnosis not present

## 2023-08-04 DIAGNOSIS — D2262 Melanocytic nevi of left upper limb, including shoulder: Secondary | ICD-10-CM | POA: Diagnosis not present

## 2023-08-04 DIAGNOSIS — D225 Melanocytic nevi of trunk: Secondary | ICD-10-CM | POA: Diagnosis not present

## 2023-08-06 ENCOUNTER — Ambulatory Visit: Payer: Medicare Other | Admitting: Family Medicine

## 2023-08-11 ENCOUNTER — Ambulatory Visit (INDEPENDENT_AMBULATORY_CARE_PROVIDER_SITE_OTHER): Payer: Medicare Other

## 2023-08-11 DIAGNOSIS — Z7901 Long term (current) use of anticoagulants: Secondary | ICD-10-CM

## 2023-08-11 LAB — POCT INR: INR: 1.2 — AB (ref 2.0–3.0)

## 2023-08-11 NOTE — Progress Notes (Signed)
 Pt reports severely reducing alcohol intake.  Increase dose today to take 1 tablet and increase dose tomorrow to take 1 1/2 tablets and then change weekly dose to take 1 tablet daily. Recheck in 1 weeks.

## 2023-08-11 NOTE — Patient Instructions (Addendum)
 Pre visit review using our clinic review tool, if applicable. No additional management support is needed unless otherwise documented below in the visit note.  Increase dose today to take 1 tablet and increase dose tomorrow to take 1 1/2 tablets and then change weekly dose to take 1 tablet daily. Recheck in 1 weeks.

## 2023-08-18 ENCOUNTER — Ambulatory Visit (INDEPENDENT_AMBULATORY_CARE_PROVIDER_SITE_OTHER): Payer: Medicare Other

## 2023-08-18 DIAGNOSIS — Z7901 Long term (current) use of anticoagulants: Secondary | ICD-10-CM | POA: Diagnosis not present

## 2023-08-18 LAB — POCT INR: INR: 1.3 — AB (ref 2.0–3.0)

## 2023-08-18 NOTE — Patient Instructions (Addendum)
Pre visit review using our clinic review tool, if applicable. No additional management support is needed unless otherwise documented below in the visit note.  Increase dose today to take 1 1/2 tablets and increase dose tomorrow to take 1 1/2 tablets and then change weekly dose to take 1 tablet daily except take 1 1/2 tablets on Monday, Wednesday, and Friday. Recheck in 1 weeks.

## 2023-08-18 NOTE — Progress Notes (Signed)
Pt reports severely reducing alcohol intake. He reports he has stopped drinking all alcohol. Increase dose today to take 1 1/2 tablets and increase dose tomorrow to take 1 1/2 tablets and then change weekly dose to take 1 tablet daily except take 1 1/2 tablets on Monday, Wednesday, and Friday. Recheck in 1 weeks.

## 2023-08-25 ENCOUNTER — Ambulatory Visit (INDEPENDENT_AMBULATORY_CARE_PROVIDER_SITE_OTHER): Payer: Medicare Other

## 2023-08-25 DIAGNOSIS — Z7901 Long term (current) use of anticoagulants: Secondary | ICD-10-CM

## 2023-08-25 LAB — POCT INR: INR: 1.7 — AB (ref 2.0–3.0)

## 2023-08-25 NOTE — Patient Instructions (Addendum)
Pre visit review using our clinic review tool, if applicable. No additional management support is needed unless otherwise documented below in the visit note.  Increase dose today to take 2 tablets and then change weekly dose to take 1 1/2 tablet daily except take 1 tablet on Tuesday, Thursday and Saturday.  Recheck in 3 weeks.

## 2023-08-25 NOTE — Progress Notes (Signed)
Pt reports severely reducing alcohol intake. He reports he has stopped drinking all alcohol. Increase dose today to take 2 tablets and then change weekly dose to take 1 1/2 tablet daily except take 1 tablet on Tuesday, Thursday and Saturday.  Recheck in 3 weeks.

## 2023-09-15 ENCOUNTER — Ambulatory Visit: Payer: Medicare Other

## 2023-09-19 ENCOUNTER — Telehealth: Payer: Self-pay

## 2023-09-19 NOTE — Telephone Encounter (Signed)
 Pt cancelled coumadin clinic apts on 2/13 and 2/17. Pt needs to [facility] apt.  LVM to return call to schedule apt.

## 2023-09-23 NOTE — Telephone Encounter (Signed)
LVM to call to RS coumadin clinic apt.

## 2023-09-25 ENCOUNTER — Ambulatory Visit: Payer: Self-pay

## 2023-09-25 NOTE — Telephone Encounter (Signed)
 I totally agree with as soon as possible follow-up with me to discuss Eliquis or Xarelto.  He had minimum needs to take aspirin 81 mg due to his heart history even before we make that decision

## 2023-09-25 NOTE — Telephone Encounter (Signed)
 Contacted pt to RS coumadin clinic apt pt NS on 2/17.  Pt reports he stopped taking warfarin about 1 week ago and has decided to not take it any longer. He said it is "just too much trouble with all the monitoring and diet changes." Advised pt the risk of clots with his hx and not taking an anticoagulant. Pt stated, "I know the signs of a clot and will know if I get one and go to the ER." He reported he does not think his risk is very high.  Advised pt he needs to discuss this with his PCP before stopping warfarin. Pt said he already stopped taking it about 1 week ago. Advised he could use Eliquis or Xarelto for anticoagulation. Advise for him to f/u with insurance to inquire which is covered and the cost. Advised he needs an apt with PCP immediately to discuss. Offered to make an apt with his PCP for him while on the phone, but pt refused. He said he would contact PCP office today and make an apt. Advised a msg would be sent to PCP to let him know. Educated on s/s of a clot even though pt said he knew the s/s.  Pt verbalized understanding.

## 2023-09-26 NOTE — Telephone Encounter (Signed)
 Pt scheduled for 12/15/23, do you want to work him in sooner?

## 2023-09-27 NOTE — Telephone Encounter (Signed)
 Friday, March 7 at 1120 in the hospital follow-up slot if possible

## 2023-09-29 DIAGNOSIS — E119 Type 2 diabetes mellitus without complications: Secondary | ICD-10-CM | POA: Diagnosis not present

## 2023-09-29 DIAGNOSIS — H527 Unspecified disorder of refraction: Secondary | ICD-10-CM | POA: Diagnosis not present

## 2023-09-29 DIAGNOSIS — H43813 Vitreous degeneration, bilateral: Secondary | ICD-10-CM | POA: Diagnosis not present

## 2023-09-29 DIAGNOSIS — Z961 Presence of intraocular lens: Secondary | ICD-10-CM | POA: Diagnosis not present

## 2023-09-29 LAB — HM DIABETES EYE EXAM

## 2023-09-29 NOTE — Telephone Encounter (Signed)
 See below

## 2023-10-01 DIAGNOSIS — Z86711 Personal history of pulmonary embolism: Secondary | ICD-10-CM | POA: Diagnosis not present

## 2023-10-01 DIAGNOSIS — K219 Gastro-esophageal reflux disease without esophagitis: Secondary | ICD-10-CM | POA: Diagnosis not present

## 2023-10-01 DIAGNOSIS — J309 Allergic rhinitis, unspecified: Secondary | ICD-10-CM | POA: Diagnosis not present

## 2023-10-03 ENCOUNTER — Encounter: Payer: Self-pay | Admitting: Family Medicine

## 2023-10-03 ENCOUNTER — Ambulatory Visit: Admitting: Family Medicine

## 2023-10-03 VITALS — BP 114/60 | HR 60 | Temp 97.3°F | Ht 71.0 in | Wt 208.0 lb

## 2023-10-03 DIAGNOSIS — I10 Essential (primary) hypertension: Secondary | ICD-10-CM | POA: Diagnosis not present

## 2023-10-03 DIAGNOSIS — F325 Major depressive disorder, single episode, in full remission: Secondary | ICD-10-CM | POA: Diagnosis not present

## 2023-10-03 DIAGNOSIS — I25118 Atherosclerotic heart disease of native coronary artery with other forms of angina pectoris: Secondary | ICD-10-CM

## 2023-10-03 DIAGNOSIS — D6851 Activated protein C resistance: Secondary | ICD-10-CM

## 2023-10-03 DIAGNOSIS — E782 Mixed hyperlipidemia: Secondary | ICD-10-CM | POA: Diagnosis not present

## 2023-10-03 NOTE — Patient Instructions (Addendum)
  We discussed benefits and risks of eliquis or coumadin particularly lowering risk of pulmonary embolism or deep vein thrombosis - you are aware of risks and prefer to monitor for signs/symptoms and would reconsider if recur  No other changes today  Recommended follow up: Return in about 6 months (around 04/04/2024) for followup or sooner if needed.Schedule b4 you leave.

## 2023-10-03 NOTE — Progress Notes (Signed)
 Phone [phone] In person visit   Subjective:   William Park is a 73 y.o. year old very pleasant male patient who presents for/with See problem oriented charting Chief Complaint  Patient presents with   medication discussion    Pt is here to discuss eliquis and Xarelto.   Past Medical History-  Patient Active Problem List   Diagnosis Date Noted   Factor V Leiden (HCC) 11/11/2022    Priority: High   History of deep venous thrombosis (DVT) of distal vein of left lower extremity 07/10/2021    Priority: High   History of pulmonary embolism 05/18/2021    Priority: High   CAD (coronary artery disease)     Priority: High   B12 deficiency 06/16/2023    Priority: Medium    Hyperglycemia 05/18/2021    Priority: Medium    Alcohol use 05/18/2021    Priority: Medium    Spinal stenosis of lumbar region with neurogenic claudication 02/19/2021    Priority: Medium    Major depressive disorder with single episode, in full remission (HCC) 05/12/2018    Priority: Medium    Hyperlipidemia     Priority: Medium    Hypertension     Priority: Medium    GERD (gastroesophageal reflux disease)     Priority: Medium    Osteoarthritis     Priority: Medium    Former smoker 01/06/2015    Priority: Low   Allergic rhinitis     Priority: Low   Overweight (BMI 25.0-29.9) 05/19/2021   Trochanteric bursitis of right hip 04/20/2021   Right leg pain 01/30/2021   Other chronic pain 01/30/2021   Peroneal tendinitis, right 05/18/2018   Anemia 08/13/2016    Medications- reviewed and updated Current Outpatient Medications  Medication Sig Dispense Refill   amLODipine (NORVASC) 5 MG tablet Take 1 tablet (5 mg total) by mouth daily. 90 tablet 3   aspirin EC 81 MG tablet Take 81 mg by mouth daily. Swallow whole.     atorvastatin (LIPITOR) 80 MG tablet Take 1 tablet (80 mg total) by mouth daily. 90 tablet 3   busPIRone (BUSPAR) 5 MG tablet Take 1 tablet (5 mg total) by mouth 2 (two) times daily  as needed (anxiety). 180 tablet 3   cyanocobalamin (VITAMIN B12) 1000 MCG/ML injection 1000 mcg (1 mg) injection once per week for four weeks, followed by 1000 mcg injection once per month. 1 mL 15   ezetimibe (ZETIA) 10 MG tablet Take 1 tablet (10 mg total) by mouth daily. 90 tablet 3   famotidine (PEPCID) 20 MG tablet Take 1 tablet (20 mg total) by mouth 2 (two) times daily. 180 tablet 3   fluticasone (FLONASE) 50 MCG/ACT nasal spray Place 2 sprays into both nostrils daily. 48 g 3   metFORMIN (GLUCOPHAGE) 500 MG tablet Take 2 tablets (1,000 mg total) by mouth daily with breakfast. (Patient taking differently: Take 500 mg by mouth daily with breakfast.) 90 tablet 3   metoprolol tartrate (LOPRESSOR) 25 MG tablet TAKE 1/2 TABLET BY MOUTH TWICE DAILY (please cut in half FOR patient) 180 tablet 3   pantoprazole (PROTONIX) 40 MG tablet Take 40 mg by mouth daily. Per ENT     sildenafil (VIAGRA) 100 MG tablet Take 1 tablet (100 mg total) by mouth daily as needed for erectile dysfunction. 10 tablet 11   valsartan (DIOVAN) 320 MG tablet Take 1 tablet (320 mg total) by mouth daily. 90 tablet 3   furosemide (LASIX) 20 MG tablet Take  1 tablet (20 mg total) by mouth daily. 90 tablet 3   No current facility-administered medications for this visit.     Objective:  BP 114/60   Pulse 60   Temp (!) 97.3 F (36.3 C)   Ht 5\' 11"  (1.803 m)   Wt 208 lb (94.3 kg)   SpO2 98%   BMI 29.01 kg/m  Gen: NAD, resting comfortably CV: RRR no murmurs rubs or gallops Lungs: CTAB no crackles, wheeze, rhonchi Ext: Trace edema Skin: warm, dry     Assessment and Plan   #social update- lost daughter in November to drugs and is grieving- decreased will to live  # History of DVT/PE- due to factor V leiden heterozygote - sees Dr. Truett Perna S: Patient diagnosed with pulmonary embolism on 05/18/2021.  Also diagnosed with DVT.   -Medication: None -Eliquis was very costly -Patient stopped Coumadin around September 18, 2023  as he grew frustrated with the monitoring and diet changes needed -diagnosed with Factor V leiden heterozygote A/P: History of DVT/PE due to factor V Leiden heterozygote-patient has made the personal decision to stop Eliquis due to cost and Coumadin due to inconvenience-she is aware of the risk of DVT and PE and even death.  He has been through a lot in his life and lost 1 daughter within the last year and the other daughter is dealing with drugs and not currently talking to him and this is really challenging.  He denies depression or desire to harm himself but reports he is okay with passing away from natural causes if it comes to that-he is also aware of signs and symptoms of pulmonary embolism or DVT and agrees to seek care if this happens and would reconsider treatment at that time - From a cost perspective discussed referral to [facility] or pharmacy or simply retrialing sending in Eliquis-he declines all these options - He is willing to restart aspirin for his CAD and this was added back to his medication list [facility]  #CAD - sees Dr. Tresa Endo #hyperlipidemia S: Medication: Atorvastatin 80 mg, Zetia 10 mg-may not require Zetia Lab Results  Component Value Date   CHOL 136 06/16/2023   HDL 72.20 06/16/2023   LDLCALC 47 06/16/2023   LDLDIRECT 74.0 11/19/2021   TRIG 83.0 06/16/2023   CHOLHDL 2 06/16/2023   A/P: CAD asymptomatic-continue current medication - Restart aspirin 81 mg now off Coumadin and Eliquis   #hypertension S: medication: Amlodipine 5 mg , valsartan 3 and 20 mg, Toprol 12.5 mg twice daily BP Readings from Last 3 Encounters:  10/03/23 114/60  07/03/23 (!) 142/92  06/23/23 125/63  A/P: well controlled continue current medications    # Depression/anxiety S: Medication: He reports he has stopped lexapro 10 mg-denies depression but reports grieving-he is still on  Buspirone 5 mg 3 times daily anxiety but is considering stopping Thoughts of self-harm: None     10/03/2023   11:14 AM 06/16/2023    9:23 AM 11/11/2022    9:50 AM  Depression screen PHQ 2/9  Decreased Interest 0 1 0  Down, Depressed, Hopeless 0 0 0  PHQ - 2 Score 0 1 0  Altered sleeping 0 0 0  Tired, decreased energy 0 0 0  Change in appetite 0 0 0  Feeling bad or failure about yourself  0 1 0  Trouble concentrating 0 0 0  Moving slowly or fidgety/restless 0 0 0  Suicidal thoughts 0 0 0  PHQ-9 Score 0 2 0  Difficult doing  work/chores Not difficult at all Not difficult at all Not difficult at all  A/P: Depression-patient denies/full remission.  Anxiety reports reasonable control.  Breathing is his biggest issue.  He wants to remain on buspirone but stay off of Lexapro  # GERD-reports saw ENT and they started him on pantoprazole and continued Pepcid   Recommended follow up: Return in about 6 months (around 04/04/2024) for followup or sooner if needed.Schedule b4 you leave. Future Appointments  Date Time Provider Department Center  11/05/2023  3:40 PM [facility] ANNUAL WELLNESS VISIT 1 [facility]  03/22/2024 11:45 AM [facility] PHLEBOTOMIST [facility] None  03/22/2024 12:00 PM Ladene Artist, MD [facility] None  04/06/2024  2:00 PM Durene Cal Aldine Contes, MD [facility]    Lab/Order associations:   ICD-10-CM   1. Factor V Leiden (HCC)  D68.51     2. Coronary artery disease of native artery of native heart with stable angina pectoris (HCC)  I25.118     3. Mixed hyperlipidemia  E78.2     4. Primary hypertension  I10     5. Major depressive disorder with single episode, in full remission (HCC)  F32.5       Return precautions advised.  Tana Conch, MD

## 2023-11-05 ENCOUNTER — Ambulatory Visit (INDEPENDENT_AMBULATORY_CARE_PROVIDER_SITE_OTHER): Payer: Medicare Other

## 2023-11-05 VITALS — BP 114/60 | Ht 71.0 in | Wt 208.0 lb

## 2023-11-05 DIAGNOSIS — Z2821 Immunization not carried out because of patient refusal: Secondary | ICD-10-CM | POA: Diagnosis not present

## 2023-11-05 DIAGNOSIS — Z0000 Encounter for general adult medical examination without abnormal findings: Secondary | ICD-10-CM | POA: Diagnosis not present

## 2023-11-05 NOTE — Progress Notes (Signed)
 Because this visit was a virtual/telehealth visit,  certain criteria was not obtained, such a blood pressure, CBG if applicable, and timed get up and go. Any medications not marked as "taking" were not mentioned during the medication reconciliation part of the visit. Any vitals not documented were not able to be obtained due to this being a telehealth visit or patient was unable to self-report a recent blood pressure reading due to a lack of equipment at home via telehealth. Vitals that have been documented are verbally provided by the patient.  Subjective:   William Park is a 73 y.o. who presents for a Medicare Wellness preventive visit.  Visit Complete: Virtual I connected with  William Park on 11/05/23 by a audio enabled telemedicine application and verified that I am speaking with the correct person using two identifiers.  Patient Location: Home  Provider Location: Home Office  I discussed the limitations of evaluation and management by telemedicine. The patient expressed understanding and agreed to proceed.  Vital Signs: Because this visit was a virtual/telehealth visit, some criteria may be missing or patient reported. Any vitals not documented were not able to be obtained and vitals that have been documented are patient reported.  VideoDeclined- This patient declined Librarian, academic. Therefore the visit was completed with audio only.  Persons Participating in Visit: Patient.  AWV Questionnaire: No: Patient Medicare AWV questionnaire was not completed prior to this visit.  Cardiac Risk Factors include: advanced age (>68men, >70 women);male gender;hypertension;dyslipidemia     Objective:    Today's Vitals   11/05/23 1536  BP: 114/60  Weight: 208 lb (94.3 kg)  Height: 5\' 11"  (1.803 m)   Body mass index is 29.01 kg/m.     11/05/2023    3:42 PM 06/23/2023   11:54 AM 09/02/2022    9:39 AM 08/19/2022   12:22 PM 01/15/2022    8:04 AM  12/17/2021    2:07 PM 08/20/2021    9:35 AM  Advanced Directives  Does Patient Have a Medical Advance Directive? Yes Yes Yes Yes Yes Yes Yes  Type of Estate agent of [place];Living will Healthcare Power of [place];Living will Healthcare Power of [place];Living will Healthcare Power of [place];Living will Healthcare Power of [place];Living will Healthcare Power of [place];Living will Healthcare Power of Attorney  Does patient want to make changes to medical advance directive? No - Patient declined No - Patient declined No - Patient declined No - Patient declined No - Patient declined No - Patient declined   Copy of Healthcare Power of Attorney in Chart? Yes - validated most recent copy scanned in chart (See row information) Yes - validated most recent copy scanned in chart (See row information) Yes - validated most recent copy scanned in chart (See row information) Yes - validated most recent copy scanned in chart (See row information) Yes - validated most recent copy scanned in chart (See row information)  Yes - validated most recent copy scanned in chart (See row information)    Current Medications (verified) Outpatient Encounter Medications as of 11/05/2023  Medication Sig   amLODipine (NORVASC) 5 MG tablet Take 1 tablet (5 mg total) by mouth daily.   aspirin EC 81 MG tablet Take 81 mg by mouth daily. Swallow whole.   atorvastatin (LIPITOR) 80 MG tablet Take 1 tablet (80 mg total) by mouth daily.   busPIRone (BUSPAR) 5 MG tablet Take 1 tablet (5 mg total) by mouth 2 (two) times daily as needed (  anxiety).   cyanocobalamin (VITAMIN B12) 1000 MCG/ML injection 1000 mcg (1 mg) injection once per week for four weeks, followed by 1000 mcg injection once per month.   ezetimibe (ZETIA) 10 MG tablet Take 1 tablet (10 mg total) by mouth daily.   famotidine (PEPCID) 20 MG tablet Take 1 tablet (20 mg total) by mouth 2 (two) times daily.   fluticasone (FLONASE) 50 MCG/ACT nasal spray  Place 2 sprays into both nostrils daily.   metFORMIN (GLUCOPHAGE) 500 MG tablet Take 2 tablets (1,000 mg total) by mouth daily with breakfast. (Patient taking differently: Take 500 mg by mouth daily with breakfast.)   metoprolol tartrate (LOPRESSOR) 25 MG tablet TAKE 1/2 TABLET BY MOUTH TWICE DAILY (please cut in half FOR patient)   pantoprazole (PROTONIX) 40 MG tablet Take 40 mg by mouth daily. Per ENT   sildenafil (VIAGRA) 100 MG tablet Take 1 tablet (100 mg total) by mouth daily as needed for erectile dysfunction.   valsartan (DIOVAN) 320 MG tablet Take 1 tablet (320 mg total) by mouth daily.   furosemide (LASIX) 20 MG tablet Take 1 tablet (20 mg total) by mouth daily.   No facility-administered encounter medications on file as of 11/05/2023.    Allergies (verified) Patient has no known allergies.   History: Past Medical History:  Diagnosis Date   Allergic rhinitis    allegra and flonase   CAD (coronary artery disease)    Dr. Tresa Endo s/p 4 angioplasty's with last in 7. ASA. simvastatin 80mg    Diabetes mellitus without complication (HCC)    DVT (deep venous thrombosis) (HCC) 04/28/2021   GERD (gastroesophageal reflux disease)    nexium 40mg  and zantac BID-mout fills with saliva and coughing. Seeing GI in [place] 12/2014   Hyperlipidemia    simvastatin 80mg , niaspan 750mg    Hypertension    losartan 50mg    Myocardial infarction (HCC)    "small MI, mild"- 1994   Osteoarthritis    mainly hands. Mobic 15mg    Past Surgical History:  Procedure Laterality Date   ANGIOPLASTY     x4   COLONOSCOPY     EYE SURGERY     LUMBAR LAMINECTOMY/DECOMPRESSION MICRODISCECTOMY Right 02/19/2021   Procedure: L4-L5 LAMINOTOMY/FORAMINOTOMY;  Surgeon: Tressie Stalker, MD;  [facility] OR;  Service: Neurosurgery;  Laterality: Right;  3C   pyloric stenosis     as a child   TONSILLECTOMY     UPPER GASTROINTESTINAL ENDOSCOPY     Family History  Problem Relation Age of Onset   Breast cancer Mother     Congestive Heart Failure Mother        70 time of death   Dementia Father        passed age 66   Kidney disease Father        CKD   CAD Father    AAA (abdominal aortic aneurysm) Father    CAD Brother    Healthy Brother    Drug abuse Daughter        died from this 2023-06-20  Depression Daughter    Drug abuse Daughter        similar to sister who passed   Depression Daughter    Colon cancer Neg Hx    Esophageal cancer Neg Hx    Stomach cancer Neg Hx    Rectal cancer Neg Hx    Social History   Socioeconomic History   Marital status: Divorced    Spouse name: Not on file   Number of children:  Not on file   Years of education: Not on file   Highest education level: Some college, no degree  Occupational History   Not on file  Tobacco Use   Smoking status: Former    Current packs/day: 0.00    Average packs/day: 2.0 packs/day for 25.0 years (50.0 ttl pk-yrs)    Types: Cigarettes    Start date: 07/29/1962    Quit date: 07/30/1987    Years since quitting: 36.2   Smokeless tobacco: Never  Vaping Use   Vaping status: Never Used  Substance and Sexual Activity   Alcohol use: Yes   Drug use: No   Sexual activity: Not on file  Other Topics Concern   Not on file  Social History Narrative   Family: Divorced. 1 living daughter (1 passed in 2024). 3 granddaughters.       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   Social Drivers of Corporate investment banker Strain: Low Risk  (11/05/2023)   Overall Financial Resource Strain (CARDIA)    Difficulty of Paying Living Expenses: Not hard at all  Food Insecurity: No Food Insecurity (11/05/2023)   Hunger Vital Sign    Worried About Running Out of Food in the Last Year: Never true    Ran Out of Food in the Last Year: Never true  Transportation Needs: No Transportation Needs (11/05/2023)   PRAPARE - Administrator, Civil Service (Medical): No    Lack of  Transportation (Non-Medical): No  Physical Activity: Insufficiently Active (11/05/2023)   Exercise Vital Sign    Days of Exercise per Week: 3 days    Minutes of Exercise per Session: 20 min  Stress: No Stress Concern Present (11/05/2023)   [employer] of Occupational Health - Occupational Stress Questionnaire    Feeling of Stress : Not at all  Social Connections: Moderately Integrated (11/05/2023)   Social Connection and Isolation Panel [NHANES]    Frequency of Communication with Friends and Family: Three times a week    Frequency of Social Gatherings with Friends and Family: Three times a week    Attends Religious Services: More than 4 times per year    Active Member of Clubs or Organizations: Yes    Attends Banker Meetings: 1 to 4 times per year    Marital Status: Divorced    Tobacco Counseling Counseling given: Not Answered    Clinical Intake:  Pre-visit preparation completed: Yes  Pain : No/denies pain     BMI - recorded: 29.01 Nutritional Status: BMI 25 -29 Overweight Nutritional Risks: None Diabetes: No  Lab Results  Component Value Date   HGBA1C 6.1 06/16/2023   HGBA1C 6.0 11/11/2022   HGBA1C 6.2 05/13/2022     How often do you need to have someone help you when you read instructions, pamphlets, or other written materials from your doctor or pharmacy?: 1 - Never What is the last grade level you completed in school?: college  Interpreter Needed?: No  Information entered by :: Estell Harpin   Activities of Daily Living     11/05/2023    3:41 PM  In your present state of health, do you have any difficulty performing the following activities:  Hearing? 0  Vision? 0  Difficulty concentrating or making decisions? 0  Walking or climbing stairs? 0  Dressing or bathing? 0  Doing errands, shopping? 0  Preparing Food and eating ?  N  Using the Toilet? N  In the past six months, have you accidently leaked urine? N  Do you have problems  with loss of bowel control? N  Managing your Medications? N  Managing your Finances? N  Housekeeping or managing your Housekeeping? N    Patient Care Team: Shelva Majestic, MD as PCP - General (Family Medicine) Lennette Bihari, MD as PCP - Cardiology (Cardiology) Ave Filter, MD as Consulting Physician (Cardiology) Erroll Luna, [facility] (Inactive) (Pharmacist)  Indicate any recent Medical Services you may have received from other than [facility] providers in the past year (date may be approximate).     Assessment:   This is a routine wellness examination for Grason.  Hearing/Vision screen Hearing Screening - Comments:: No hearing issues Vision Screening - Comments:: Patient wears readers   Goals Addressed             This Visit's Progress    Track and Manage My Blood Pressure-Hypertension   On track    Timeframe:  Long-Range Goal Priority:  High Start Date:      04/15/22                       Expected End Date:     10/13/21                  Follow Up Date 07/15/22/    - check blood pressure 3 times per week    Why is this important?   You won't feel high blood pressure, but it can still hurt your blood vessels.  High blood pressure can cause heart or kidney problems. It can also cause a stroke.  Making lifestyle changes like losing a little weight or eating less salt will help.  Checking your blood pressure at home and at different times of the day can help to control blood pressure.  If the doctor prescribes medicine remember to take it the way the doctor ordered.  Call the office if you cannot afford the medicine or if there are questions about it.     Notes:        Depression Screen     11/05/2023    3:43 PM 10/03/2023   11:14 AM 06/16/2023    9:23 AM 11/11/2022    9:50 AM 09/02/2022    9:37 AM 05/13/2022    1:07 PM 11/19/2021    2:43 PM  PHQ 2/9 Scores  PHQ - 2 Score 0 0 1 0 0 0 0  PHQ- 9 Score 0 0 2 0  0 1    Fall Risk     11/05/2023    3:42 PM  06/16/2023    9:23 AM 11/11/2022    9:50 AM 08/31/2022    3:29 PM 08/20/2021    9:36 AM  Fall Risk   Falls in the past year? 0 0 0 0 0  Number falls in past yr: 0 0 0 0 0  Injury with Fall? 0 0 0 0 0  Risk for fall due to : No Fall Risks No Fall Risks No Fall Risks Impaired vision Impaired vision  Follow up Falls prevention discussed;Falls evaluation completed Falls evaluation completed Falls evaluation completed Falls prevention discussed Falls prevention discussed    MEDICARE RISK AT HOME:  Medicare Risk at Home Any stairs in or around the home?: No If so, are there any without handrails?: Yes Home free of loose throw rugs in walkways, pet beds, electrical cords,  etc?: Yes Adequate lighting in your home to reduce risk of falls?: Yes Life alert?: No Use of a cane, walker or w/c?: No Grab bars in the bathroom?: Yes Shower chair or bench in shower?: Yes Elevated toilet seat or a handicapped toilet?: Yes  TIMED UP AND GO:  Was the test performed?  No  Cognitive Function: 6CIT completed        11/05/2023    3:38 PM 09/02/2022    9:41 AM 08/20/2021    9:38 AM 04/10/2020    9:00 AM  6CIT Screen  What Year? 0 points 0 points 0 points 0 points  What month? 0 points 0 points 0 points 0 points  What time? 0 points 0 points 0 points   Count back from 20 0 points 0 points 0 points 0 points  Months in reverse 0 points 0 points 0 points 0 points  Repeat phrase 0 points 0 points 0 points 0 points  Total Score 0 points 0 points 0 points     Immunizations Immunization History  Administered Date(s) Administered   Fluad Quad(high Dose 65+) 04/10/2020, 04/09/2021, 05/13/2022   Fluad Trivalent(High Dose 65+) 06/16/2023   Influenza Whole 04/29/2019   Influenza, High Dose Seasonal PF 04/07/2017, 05/12/2018   Influenza,inj,Quad PF,6+ Mos 07/21/2015   Influenza-Unspecified 07/17/2016   Moderna Sars-Covid-2 Vaccination 11/22/2019, 12/20/2019   Pneumococcal Conjugate-13 07/21/2015    Pneumococcal Polysaccharide-23 03/10/2017   Tdap 01/26/2016   Zoster Recombinant(Shingrix) 07/31/2021, 10/10/2021    Screening Tests Health Maintenance  Topic Date Due   COVID-19 Vaccine (3 - Moderna risk series) 01/17/2020   Diabetic kidney evaluation - Urine ACR  07/18/2024 (Originally 07/08/1969)   Hepatitis C Screening  07/25/2098 (Originally 07/08/1969)   INFLUENZA VACCINE  02/27/2024   Diabetic kidney evaluation - eGFR measurement  06/15/2024   Medicare Annual Wellness (AWV)  11/04/2024   DTaP/Tdap/Td (2 - Td or Tdap) 01/25/2026   Colonoscopy  02/26/2032   Pneumonia Vaccine 38+ Years old  Completed   Zoster Vaccines- Shingrix  Completed   HPV VACCINES  Aged Out   Fecal DNA (Cologuard)  Discontinued    Health Maintenance  Health Maintenance Due  Topic Date Due   COVID-19 Vaccine (3 - Moderna risk series) 01/17/2020   Health Maintenance Items Addressed:covid 19 vaccine declined  Additional Screening:  Vision Screening: Recommended annual ophthalmology exams for early detection of glaucoma and other disorders of the eye.  Dental Screening: Recommended annual dental exams for proper oral hygiene  Community Resource Referral / Chronic Care Management: CRR required this visit?  No   CCM required this visit?  No     Plan:     I have personally reviewed and noted the following in the patient's chart:   Medical and social history Use of alcohol, tobacco or illicit drugs  Current medications and supplements including opioid prescriptions. Patient is not currently taking opioid prescriptions. Functional ability and status Nutritional status Physical activity Advanced directives List of other physicians Hospitalizations, surgeries, and ER visits in previous 12 months Vitals Screenings to include cognitive, depression, and falls Referrals and appointments  In addition, I have reviewed and discussed with patient certain preventive protocols, quality metrics, and  best practice recommendations. A written personalized care plan for preventive services as well as general preventive health recommendations were provided to patient.     Rudi Heap, [place]   11/05/2023   After Visit Summary: (MyChart) Due to this being a telephonic visit, the after visit summary with patients  personalized plan was offered to patient via MyChart   Notes: Nothing significant to report at this time.

## 2023-11-05 NOTE — Patient Instructions (Signed)
 [name] , Thank you for taking time to come for your Medicare Wellness Visit. I appreciate your ongoing commitment to your health goals. Please review the following plan we discussed and let me know if I can assist you in the future.   Referrals/Orders/Follow-Ups/Clinician Recommendations: follow up in 1 year.  This is a list of the screening recommended for you and due dates:  Health Maintenance  Topic Date Due   COVID-19 Vaccine (3 - Moderna risk series) 01/17/2020   Yearly kidney health urinalysis for diabetes  07/18/2024*   Hepatitis C Screening  07/25/2098*   Flu Shot  02/27/2024   Yearly kidney function blood test for diabetes  06/15/2024   Medicare Annual Wellness Visit  11/04/2024   DTaP/Tdap/Td vaccine (2 - Td or Tdap) 01/25/2026   Colon Cancer Screening  02/26/2032   Pneumonia Vaccine  Completed   Zoster (Shingles) Vaccine  Completed   HPV Vaccine  Aged Out   Cologuard (Stool DNA test)  Discontinued  *Topic was postponed. The date shown is not the original due date.    Advanced directives: (In Chart) A copy of your advanced directives are scanned into your chart should your provider ever need it.  Next Medicare Annual Wellness Visit scheduled for next year: Yes

## 2023-11-07 DIAGNOSIS — M4722 Other spondylosis with radiculopathy, cervical region: Secondary | ICD-10-CM | POA: Diagnosis not present

## 2023-11-07 DIAGNOSIS — Z6829 Body mass index (BMI) 29.0-29.9, adult: Secondary | ICD-10-CM | POA: Diagnosis not present

## 2023-11-07 DIAGNOSIS — M542 Cervicalgia: Secondary | ICD-10-CM | POA: Diagnosis not present

## 2023-11-07 DIAGNOSIS — R200 Anesthesia of skin: Secondary | ICD-10-CM | POA: Diagnosis not present

## 2023-11-12 DIAGNOSIS — K219 Gastro-esophageal reflux disease without esophagitis: Secondary | ICD-10-CM | POA: Diagnosis not present

## 2023-12-04 DIAGNOSIS — R200 Anesthesia of skin: Secondary | ICD-10-CM | POA: Diagnosis not present

## 2023-12-04 DIAGNOSIS — M4802 Spinal stenosis, cervical region: Secondary | ICD-10-CM | POA: Diagnosis not present

## 2023-12-04 DIAGNOSIS — M4182 Other forms of scoliosis, cervical region: Secondary | ICD-10-CM | POA: Diagnosis not present

## 2023-12-04 DIAGNOSIS — R202 Paresthesia of skin: Secondary | ICD-10-CM | POA: Diagnosis not present

## 2023-12-04 DIAGNOSIS — M4722 Other spondylosis with radiculopathy, cervical region: Secondary | ICD-10-CM | POA: Diagnosis not present

## 2023-12-15 ENCOUNTER — Ambulatory Visit (INDEPENDENT_AMBULATORY_CARE_PROVIDER_SITE_OTHER): Payer: Medicare Other

## 2023-12-15 ENCOUNTER — Ambulatory Visit: Payer: Medicare Other | Admitting: Family Medicine

## 2023-12-30 DIAGNOSIS — M40209 Unspecified kyphosis, site unspecified: Secondary | ICD-10-CM | POA: Diagnosis not present

## 2023-12-30 DIAGNOSIS — M4722 Other spondylosis with radiculopathy, cervical region: Secondary | ICD-10-CM | POA: Diagnosis not present

## 2023-12-30 DIAGNOSIS — M4312 Spondylolisthesis, cervical region: Secondary | ICD-10-CM | POA: Diagnosis not present

## 2023-12-30 DIAGNOSIS — Z6827 Body mass index (BMI) 27.0-27.9, adult: Secondary | ICD-10-CM | POA: Diagnosis not present

## 2024-01-21 DIAGNOSIS — K219 Gastro-esophageal reflux disease without esophagitis: Secondary | ICD-10-CM | POA: Diagnosis not present

## 2024-01-21 DIAGNOSIS — J309 Allergic rhinitis, unspecified: Secondary | ICD-10-CM | POA: Diagnosis not present

## 2024-02-25 ENCOUNTER — Telehealth: Payer: Self-pay | Admitting: Oncology

## 2024-02-25 NOTE — Telephone Encounter (Signed)
 Pt called and left voicemail, called PT back. PT wants to cancel appointment, said that he doesn't needs to see Cloretta and will call back to reschedule if needed.

## 2024-03-22 ENCOUNTER — Other Ambulatory Visit: Payer: Medicare Other

## 2024-03-22 ENCOUNTER — Ambulatory Visit: Payer: Medicare Other | Admitting: Oncology

## 2024-04-06 ENCOUNTER — Encounter: Payer: Self-pay | Admitting: Family Medicine

## 2024-04-06 ENCOUNTER — Ambulatory Visit: Admitting: Family Medicine

## 2024-04-06 VITALS — BP 118/62 | HR 48 | Temp 98.2°F | Ht 71.0 in | Wt 207.0 lb

## 2024-04-06 DIAGNOSIS — I10 Essential (primary) hypertension: Secondary | ICD-10-CM | POA: Diagnosis not present

## 2024-04-06 DIAGNOSIS — Z23 Encounter for immunization: Secondary | ICD-10-CM

## 2024-04-06 DIAGNOSIS — R739 Hyperglycemia, unspecified: Secondary | ICD-10-CM | POA: Diagnosis not present

## 2024-04-06 DIAGNOSIS — E538 Deficiency of other specified B group vitamins: Secondary | ICD-10-CM | POA: Diagnosis not present

## 2024-04-06 DIAGNOSIS — K219 Gastro-esophageal reflux disease without esophagitis: Secondary | ICD-10-CM | POA: Diagnosis not present

## 2024-04-06 DIAGNOSIS — D6851 Activated protein C resistance: Secondary | ICD-10-CM

## 2024-04-06 DIAGNOSIS — F325 Major depressive disorder, single episode, in full remission: Secondary | ICD-10-CM | POA: Diagnosis not present

## 2024-04-06 DIAGNOSIS — E782 Mixed hyperlipidemia: Secondary | ICD-10-CM

## 2024-04-06 DIAGNOSIS — Z66 Do not resuscitate: Secondary | ICD-10-CM

## 2024-04-06 DIAGNOSIS — I25118 Atherosclerotic heart disease of native coronary artery with other forms of angina pectoris: Secondary | ICD-10-CM | POA: Diagnosis not present

## 2024-04-06 DIAGNOSIS — Z131 Encounter for screening for diabetes mellitus: Secondary | ICD-10-CM

## 2024-04-06 NOTE — Assessment & Plan Note (Signed)
 He has chosen DNR and DNI status, wishing to avoid resuscitation and intubation in the event of cardiac or pulmonary arrest. Aware of the risks of death and debility associated with this decision. - Complete and post yellow DNR/DNI form at home - Inform family and friends of DNR/DNI decision -I did want to verify that he has no thoughts of self-harm and he states he does not have those but admits he wants to de-escalate care after loss of both daughters within the last 18 months and we will respect his decision

## 2024-04-06 NOTE — Assessment & Plan Note (Signed)
 Coronary artery disease with history of atorvastatin  discontinuation per his personal preference. He prefers to remain off atorvastatin  despite understanding increased risk of nonfatal heart attack and nonfatal stroke, which could lead to advanced heart failure or debility. - Continue aspirin  81 mg daily --He declines taking atorvastatin  or Zetia  at this point - He plans to de-escalate his care and focus on quality of life

## 2024-04-06 NOTE — Assessment & Plan Note (Signed)
 Vitamin B12 deficiency. He has not taken B12 injections for several months. - Check B12 levels - Consider restarting B12 injections if deficiency persists

## 2024-04-06 NOTE — Assessment & Plan Note (Signed)
 Depression and anxiety. He has discontinued Lexapro  and buspirone . Recent personal tragedies may impact mental health.  He denies depression but endorses grief - Encourage consideration of hospice grief counseling

## 2024-04-06 NOTE — Assessment & Plan Note (Signed)
 History of DVT/PE due to factor V Leiden heterozygote. He has discontinued Eliquis  and Coumadin  due to cost and monitoring issues. Aware of high risk for recurrence and symptoms to monitor.  He considers as part of his de-escalation of care - Continue aspirin  81 mg daily - Educate on signs of DVT/PE such as calf swelling, pain, chest pain, and shortness of breath

## 2024-04-06 NOTE — Assessment & Plan Note (Signed)
 Likely poorly controlled off atorvastatin  and will likely worsen off of Zetia  but this is a strong preference to remain off

## 2024-04-06 NOTE — Assessment & Plan Note (Signed)
 Prediabetes with last A1c of 6.1. He has discontinued metformin . Discussed potential progression to diabetes and associated risks, including retinopathy and kidney damage. - Monitor A1c levels - Consider restarting metformin  if diabetes develops

## 2024-04-06 NOTE — Assessment & Plan Note (Signed)
 Gastroesophageal reflux disease managed with pantoprazole  and Pepcid . ENT recommended these medications to manage laryngospasm. - Continue pantoprazole  40 mg daily - Continue Pepcid  as needed

## 2024-04-06 NOTE — Assessment & Plan Note (Signed)
 Hypertension is well-controlled with blood pressure reading of 118/62 mmHg. He has discontinued valsartan  to reduce medication burden. Metoprolol  is continued to help control heart rate and reduce cardiac strain. - Continue amlodipine  5 mg daily - Continue metoprolol  12.5 mg twice daily

## 2024-04-06 NOTE — Progress Notes (Signed)
 Phone [phone] In person visit   Subjective:   William Park is a 73 y.o. year old very pleasant male patient who presents for/with See problem oriented charting Chief Complaint  Patient presents with   Factor V Leiden   Discussed the use of AI scribe software for clinical note transcription with the patient, who gave verbal consent to proceed.  History of Present Illness   William Park is a 73 year old male with coronary artery disease who presents for follow-up.  He reports significant personal stress due to the recent death of his second daughter in a car accident, following the death of another daughter from fentanyl  use in 2024. He is dealing with grief and has support from his church community. He has three granddaughters, one of whom was adopted before the death of her mother, his first daughter.  His heart is understandably aching and he has made a conscious decision not to try to prolong his life with medication-she would like to focus on medicines for comfort.  We discussed there is risk for nonfatal heart attack and stroke as well as pulmonary embolism/DVT but he prefers to reduce medicines as listed in assessment and plan and as noted in HPI below  He is currently taking amlodipine  5 mg daily, aspirin  81 mg, and Zetia  10 mg for coronary artery disease. He had been on atorvastatin  80 mg, but it has fallen off his medication list, and he has stopped it as he has a desire to minimize his medications. He has a history of DVT/PE due to factor V Leiden heterozygote and has stopped Eliquis  due to expense and Coumadin  due to frustration with monitoring and dietary restrictions. He monitors for symptoms such as shortness of breath, chest pain, calf pain, or swelling.  He is taking metoprolol  12.5 mg twice daily. Valsartan  320 mg has also fallen off his medication list, and he has stopped it on his own.  He is still on the amlodipine  5 mg though.  For depression  and anxiety, he had previously stopped Lexapro  10 mg as well as stopping buspirone  5 mg twice a day as needed for anxiety.   He has acid reflux and is on pantoprazole  40 mg and Pepcid , which was originally encouraged by an ENT. He experiences laryngospasm and continues these medications for comfort.  His prediabetes has been reasonably well controlled with a last A1c of 6.1. He was previously on metformin  500 mg daily but has stopped it.  He has seen Dr. Mavis of [facility] since the last visit to discuss neck pain, bilateral arm and hand numbness and tingling, and ongoing low back pain. An MRI of the cervical spine showed diffuse degenerative changes.       04/06/2024    1:57 PM 11/05/2023    3:43 PM 10/03/2023   11:14 AM  Depression screen PHQ 2/9  Decreased Interest 0 0 0  Down, Depressed, Hopeless 2 0 0  PHQ - 2 Score 2 0 0  Altered sleeping 0 0 0  Tired, decreased energy 0 0 0  Change in appetite 0 0 0  Feeling bad or failure about yourself  0 0 0  Trouble concentrating 0 0 0  Moving slowly or fidgety/restless 0 0 0  Suicidal thoughts 0 0 0  PHQ-9 Score 2 0 0  Difficult doing work/chores Not difficult at all Not difficult at all Not difficult at all       04/06/2024    1:58  PM 06/16/2023    9:23 AM 11/11/2022    9:50 AM  GAD 7 : Generalized Anxiety Score  Nervous, Anxious, on Edge 1 0 0  Control/stop worrying 1 0 0  Worry too much - different things 0 1 0  Trouble relaxing 0 0 0  Restless 0 0 0  Easily annoyed or irritable 0 0 0  Afraid - awful might happen 0 0 0  Total GAD 7 Score 2 1 0  Anxiety Difficulty Not difficult at all Not difficult at all Not difficult at all      Past Medical History-  Patient Active Problem List   Diagnosis Date Noted   Factor V Leiden (HCC) 11/11/2022    Priority: High   History of deep venous thrombosis (DVT) of distal vein of left lower extremity 07/10/2021    Priority: High   History of pulmonary embolism 05/18/2021     Priority: High   CAD (coronary artery disease)     Priority: High   B12 deficiency 06/16/2023    Priority: Medium    Hyperglycemia 05/18/2021    Priority: Medium    Alcohol use 05/18/2021    Priority: Medium    Spinal stenosis of lumbar region with neurogenic claudication 02/19/2021    Priority: Medium    Major depressive disorder with single episode, in full remission (HCC) 05/12/2018    Priority: Medium    Hyperlipidemia     Priority: Medium    Hypertension     Priority: Medium    GERD (gastroesophageal reflux disease)     Priority: Medium    Osteoarthritis     Priority: Medium    Former smoker 01/06/2015    Priority: Low   Allergic rhinitis     Priority: Low   Overweight (BMI 25.0-29.9) 05/19/2021   Trochanteric bursitis of right hip 04/20/2021   Right leg pain 01/30/2021   Other chronic pain 01/30/2021   Peroneal tendinitis, right 05/18/2018   Anemia 08/13/2016    Medications- reviewed and updated Current Outpatient Medications  Medication Sig Dispense Refill   amLODipine  (NORVASC ) 5 MG tablet Take 1 tablet (5 mg total) by mouth daily. 90 tablet 3   aspirin  EC 81 MG tablet Take 81 mg by mouth daily. Swallow whole.     famotidine  (PEPCID ) 20 MG tablet Take 1 tablet (20 mg total) by mouth 2 (two) times daily. 180 tablet 3   fluticasone  (FLONASE ) 50 MCG/ACT nasal spray Place 2 sprays into both nostrils daily. 48 g 3   metoprolol  tartrate (LOPRESSOR ) 25 MG tablet TAKE 1/2 TABLET BY MOUTH TWICE DAILY (please cut in half FOR patient) 180 tablet 3   pantoprazole  (PROTONIX ) 40 MG tablet Take 40 mg by mouth daily. Per ENT     sildenafil  (VIAGRA ) 100 MG tablet Take 1 tablet (100 mg total) by mouth daily as needed for erectile dysfunction. 10 tablet 11   No current facility-administered medications for this visit.     Objective:  BP 118/62 (BP Location: Left Arm, Patient Position: Sitting, Cuff Size: Normal)   Pulse (!) 48   Temp 98.2 F (36.8 C) (Temporal)   Ht 5' 11  (1.803 m)   Wt 207 lb (93.9 kg)   SpO2 96%   BMI 28.87 kg/m  Gen: understandably tearful discussing daughters situation CV: RRR no murmurs rubs or gallops Lungs: CTAB no crackles, wheeze, rhonchi Ext: 1+ edema Skin: warm, dry     Assessment and Plan    Assessment & Plan DNR (do not  resuscitate) He has chosen DNR and DNI status, wishing to avoid resuscitation and intubation in the event of cardiac or pulmonary arrest. Aware of the risks of death and debility associated with this decision. - Complete and post yellow DNR/DNI form at home - Inform family and friends of DNR/DNI decision -I did want to verify that he has no thoughts of self-harm and he states he does not have those but admits he wants to de-escalate care after loss of both daughters within the last 18 months and we will respect his decision Coronary artery disease of native artery of native heart with stable angina pectoris (HCC) Coronary artery disease with history of atorvastatin  discontinuation per his personal preference. He prefers to remain off atorvastatin  despite understanding increased risk of nonfatal heart attack and nonfatal stroke, which could lead to advanced heart failure or debility. - Continue aspirin  81 mg daily --He declines taking atorvastatin  or Zetia  at this point - He plans to de-escalate his care and focus on quality of life Mixed hyperlipidemia Likely poorly controlled off atorvastatin  and will likely worsen off of Zetia  but this is a strong preference to remain off Primary hypertension Hypertension is well-controlled with blood pressure reading of 118/62 mmHg. He has discontinued valsartan  to reduce medication burden. Metoprolol  is continued to help control heart rate and reduce cardiac strain. - Continue amlodipine  5 mg daily - Continue metoprolol  12.5 mg twice daily Hyperglycemia Prediabetes with last A1c of 6.1. He has discontinued metformin . Discussed potential progression to diabetes and  associated risks, including retinopathy and kidney damage. - Monitor A1c levels - Consider restarting metformin  if diabetes develops Factor V Leiden (HCC) History of DVT/PE due to factor V Leiden heterozygote. He has discontinued Eliquis  and Coumadin  due to cost and monitoring issues. Aware of high risk for recurrence and symptoms to monitor.  He considers as part of his de-escalation of care - Continue aspirin  81 mg daily - Educate on signs of DVT/PE such as calf swelling, pain, chest pain, and shortness of breath B12 deficiency Vitamin B12 deficiency. He has not taken B12 injections for several months. - Check B12 levels - Consider restarting B12 injections if deficiency persists Major depressive disorder with single episode, in full remission (HCC) Depression and anxiety. He has discontinued Lexapro  and buspirone . Recent personal tragedies may impact mental health.  He denies depression but endorses grief - Encourage consideration of hospice grief counseling Gastroesophageal reflux disease, unspecified whether esophagitis present Gastroesophageal reflux disease managed with pantoprazole  and Pepcid . ENT recommended these medications to manage laryngospasm. - Continue pantoprazole  40 mg daily - Continue Pepcid  as needed   Recommended follow up: Return in about 6 months (around 10/04/2024) for followup or sooner if needed.Schedule b4 you leave. Future Appointments  Date Time Provider Department Center  10/04/2024  1:20 PM Katrinka Garnette KIDD, MD [facility]  11/10/2024  3:40 PM [facility] ANNUAL WELLNESS VISIT 1 [facility]    Lab/Order associations: oatmeal this am   ICD-10-CM   1. Need for influenza vaccination  Z23 Flu vaccine HIGH DOSE PF(Fluzone Trivalent)    2. Coronary artery disease of native artery of native heart with stable angina pectoris (HCC)  I25.118     3. Factor V Leiden (HCC)  D68.51     4. Mixed hyperlipidemia  E78.2 Comprehensive metabolic panel with  GFR    CBC with Differential/Platelet    Lipid panel    5. Hyperglycemia  R73.9 Hemoglobin A1c    6. Primary hypertension  I10 Comprehensive  metabolic panel with GFR    CBC with Differential/Platelet    Lipid panel    7. B12 deficiency  E53.8 Vitamin B12    8. Screening for diabetes mellitus  Z13.1 Hemoglobin A1c      No orders of the defined types were placed in this encounter.   Return precautions advised.  Garnette Lukes, MD

## 2024-04-06 NOTE — Patient Instructions (Addendum)
 Please stop by lab before you go If you have mychart- we will send your results within 3 business days of us  receiving them.  If you do not have mychart- we will call you about results within 5 business days of us  receiving them.  *please also note that you will see labs on mychart as soon as they post. I will later go in and write notes on them- will say notes from Dr. Katrinka Bourdon made decision to reduce medications and understand this can increase risk of death or debility- we can always represcribe if he changes his mind. You also desired to change to DNR/DNI - post form in your home and make loved ones/friends aware of decision  Strongly encourage hospice grief counseling  Recommended follow up: Return in about 6 months (around 10/04/2024) for followup or sooner if needed.Schedule b4 you leave.

## 2024-04-07 ENCOUNTER — Ambulatory Visit: Payer: Self-pay | Admitting: Family Medicine

## 2024-04-07 LAB — CBC WITH DIFFERENTIAL/PLATELET
Basophils Absolute: 0.1 K/uL (ref 0.0–0.1)
Basophils Relative: 1 % (ref 0.0–3.0)
Eosinophils Absolute: 0.1 K/uL (ref 0.0–0.7)
Eosinophils Relative: 1.3 % (ref 0.0–5.0)
HCT: 42.7 % (ref 39.0–52.0)
Hemoglobin: 14.2 g/dL (ref 13.0–17.0)
Lymphocytes Relative: 47.1 % — ABNORMAL HIGH (ref 12.0–46.0)
Lymphs Abs: 2.4 K/uL (ref 0.7–4.0)
MCHC: 33.3 g/dL (ref 30.0–36.0)
MCV: 98.8 fl (ref 78.0–100.0)
Monocytes Absolute: 0.7 K/uL (ref 0.1–1.0)
Monocytes Relative: 12.8 % — ABNORMAL HIGH (ref 3.0–12.0)
Neutro Abs: 2 K/uL (ref 1.4–7.7)
Neutrophils Relative %: 37.8 % — ABNORMAL LOW (ref 43.0–77.0)
Platelets: 122 K/uL — ABNORMAL LOW (ref 150.0–400.0)
RBC: 4.32 Mil/uL (ref 4.22–5.81)
RDW: 15.2 % (ref 11.5–15.5)
WBC: 5.2 K/uL (ref 4.0–10.5)

## 2024-04-07 LAB — LIPID PANEL
Cholesterol: 170 mg/dL (ref 0–200)
HDL: 49.6 mg/dL (ref >39.00)
LDL Cholesterol: 92 mg/dL (ref 0–99)
NonHDL: 119.97
Total CHOL/HDL Ratio: 3
Triglycerides: 142 mg/dL (ref 0.0–149.0)
VLDL: 28.4 mg/dL (ref 0.0–40.0)

## 2024-04-07 LAB — COMPREHENSIVE METABOLIC PANEL WITH GFR
ALT: 17 U/L (ref 0–53)
AST: 21 U/L (ref 0–37)
Albumin: 4.1 g/dL (ref 3.5–5.2)
Alkaline Phosphatase: 55 U/L (ref 39–117)
BUN: 14 mg/dL (ref 6–23)
CO2: 28 meq/L (ref 19–32)
Calcium: 9.3 mg/dL (ref 8.4–10.5)
Chloride: 103 meq/L (ref 96–112)
Creatinine, Ser: 1.13 mg/dL (ref 0.40–1.50)
GFR: 64.37 mL/min (ref >60.00)
Glucose, Bld: 79 mg/dL (ref 70–99)
Potassium: 4 meq/L (ref 3.5–5.1)
Sodium: 140 meq/L (ref 135–145)
Total Bilirubin: 0.7 mg/dL (ref 0.2–1.2)
Total Protein: 6.9 g/dL (ref 6.0–8.3)

## 2024-04-07 LAB — HEMOGLOBIN A1C: Hgb A1c MFr Bld: 6.3 % (ref 4.6–6.5)

## 2024-04-07 LAB — VITAMIN B12: Vitamin B-12: 186 pg/mL — ABNORMAL LOW (ref 211–911)

## 2024-04-07 MED ORDER — CYANOCOBALAMIN 1000 MCG/ML IJ SOLN: INTRAMUSCULAR | 15 refills | Status: AC

## 2024-04-30 DIAGNOSIS — M25561 Pain in right knee: Secondary | ICD-10-CM | POA: Diagnosis not present

## 2024-05-25 DIAGNOSIS — K219 Gastro-esophageal reflux disease without esophagitis: Secondary | ICD-10-CM | POA: Diagnosis not present

## 2024-06-01 ENCOUNTER — Other Ambulatory Visit: Payer: Self-pay | Admitting: Family Medicine

## 2024-06-08 ENCOUNTER — Other Ambulatory Visit: Payer: Self-pay | Admitting: Family Medicine

## 2024-06-08 DIAGNOSIS — M25561 Pain in right knee: Secondary | ICD-10-CM | POA: Diagnosis not present

## 2024-06-26 DIAGNOSIS — M25561 Pain in right knee: Secondary | ICD-10-CM | POA: Diagnosis not present

## 2024-07-05 DIAGNOSIS — S83231D Complex tear of medial meniscus, current injury, right knee, subsequent encounter: Secondary | ICD-10-CM | POA: Diagnosis not present

## 2024-07-13 DIAGNOSIS — Z0130 Encounter for examination of blood pressure without abnormal findings: Secondary | ICD-10-CM | POA: Diagnosis not present

## 2024-07-13 DIAGNOSIS — J101 Influenza due to other identified influenza virus with other respiratory manifestations: Secondary | ICD-10-CM | POA: Diagnosis not present

## 2024-07-13 DIAGNOSIS — R6889 Other general symptoms and signs: Secondary | ICD-10-CM | POA: Diagnosis not present

## 2024-10-04 ENCOUNTER — Ambulatory Visit: Admitting: Family Medicine

## 2024-10-05 ENCOUNTER — Ambulatory Visit: Admitting: Family Medicine

## 2024-11-10 ENCOUNTER — Ambulatory Visit
